# Patient Record
Sex: Male | Born: 1945 | Race: Black or African American | Hispanic: No | Marital: Married | Smoking: Never smoker
Health system: Southern US, Community
[De-identification: ages and names within clinical notes are randomized; demographics above are authoritative.]

## PROBLEM LIST (undated history)

## (undated) DIAGNOSIS — E7800 Pure hypercholesterolemia, unspecified: Secondary | ICD-10-CM

## (undated) DIAGNOSIS — E119 Type 2 diabetes mellitus without complications: Secondary | ICD-10-CM

## (undated) DIAGNOSIS — I10 Essential (primary) hypertension: Secondary | ICD-10-CM

## (undated) DIAGNOSIS — I509 Heart failure, unspecified: Secondary | ICD-10-CM

## (undated) DIAGNOSIS — N289 Disorder of kidney and ureter, unspecified: Secondary | ICD-10-CM

## (undated) DIAGNOSIS — I5189 Other ill-defined heart diseases: Secondary | ICD-10-CM

## (undated) HISTORY — DX: Other ill-defined heart diseases: I51.89

## (undated) HISTORY — PX: NEPHRECTOMY: SHX65

---

## 2013-09-03 ENCOUNTER — Other Ambulatory Visit: Payer: Self-pay | Admitting: Cardiology

## 2013-09-03 DIAGNOSIS — I150 Renovascular hypertension: Secondary | ICD-10-CM

## 2013-09-03 DIAGNOSIS — N189 Chronic kidney disease, unspecified: Secondary | ICD-10-CM

## 2013-09-06 ENCOUNTER — Other Ambulatory Visit: Payer: Self-pay | Admitting: Cardiology

## 2013-09-06 DIAGNOSIS — I872 Venous insufficiency (chronic) (peripheral): Secondary | ICD-10-CM

## 2013-09-13 ENCOUNTER — Ambulatory Visit
Admission: RE | Admit: 2013-09-13 | Discharge: 2013-09-13 | Disposition: A | Discharge status: Home / Self Care | Payer: No Typology Code available for payment source | Source: Ambulatory Visit | Attending: Cardiology | Admitting: Cardiology

## 2013-09-13 DIAGNOSIS — I150 Renovascular hypertension: Secondary | ICD-10-CM

## 2013-09-13 DIAGNOSIS — N189 Chronic kidney disease, unspecified: Secondary | ICD-10-CM

## 2013-09-16 ENCOUNTER — Other Ambulatory Visit: Payer: Self-pay | Admitting: Cardiology

## 2013-09-16 DIAGNOSIS — N2889 Other specified disorders of kidney and ureter: Secondary | ICD-10-CM

## 2013-09-21 ENCOUNTER — Ambulatory Visit
Admission: RE | Admit: 2013-09-21 | Discharge: 2013-09-21 | Disposition: A | Discharge status: Home / Self Care | Payer: No Typology Code available for payment source | Source: Ambulatory Visit | Attending: Cardiology | Admitting: Cardiology

## 2013-09-21 DIAGNOSIS — N2889 Other specified disorders of kidney and ureter: Secondary | ICD-10-CM

## 2013-10-13 ENCOUNTER — Ambulatory Visit: Payer: Self-pay

## 2013-10-28 ENCOUNTER — Ambulatory Visit (HOSPITAL_COMMUNITY)
Admission: RE | Admit: 2013-10-28 | Discharge: 2013-10-28 | Disposition: A | Discharge status: Home / Self Care | Payer: Self-pay | Source: Ambulatory Visit | Attending: Urology | Admitting: Urology

## 2013-10-28 ENCOUNTER — Other Ambulatory Visit (HOSPITAL_COMMUNITY): Payer: Self-pay | Admitting: Urology

## 2013-10-28 DIAGNOSIS — J984 Other disorders of lung: Secondary | ICD-10-CM

## 2013-10-28 DIAGNOSIS — I517 Cardiomegaly: Secondary | ICD-10-CM | POA: Insufficient documentation

## 2013-10-28 DIAGNOSIS — J9819 Other pulmonary collapse: Secondary | ICD-10-CM | POA: Insufficient documentation

## 2013-10-28 DIAGNOSIS — R0602 Shortness of breath: Secondary | ICD-10-CM | POA: Insufficient documentation

## 2013-10-28 DIAGNOSIS — R079 Chest pain, unspecified: Secondary | ICD-10-CM | POA: Insufficient documentation

## 2013-11-06 ENCOUNTER — Encounter (HOSPITAL_COMMUNITY): Payer: Self-pay | Admitting: Emergency Medicine

## 2013-11-06 ENCOUNTER — Emergency Department (HOSPITAL_COMMUNITY): Payer: Self-pay

## 2013-11-06 ENCOUNTER — Inpatient Hospital Stay (HOSPITAL_COMMUNITY)
Admission: EM | Admit: 2013-11-06 | Discharge: 2013-11-11 | DRG: 673 | Disposition: A | Discharge status: Home / Self Care | Payer: No Typology Code available for payment source | Attending: Internal Medicine | Admitting: Internal Medicine

## 2013-11-06 ENCOUNTER — Emergency Department (HOSPITAL_COMMUNITY)
Admission: EM | Admit: 2013-11-06 | Discharge: 2013-11-06 | Disposition: A | Discharge status: Nursing Home (Includes ICF) | Payer: No Typology Code available for payment source | Source: Home / Self Care | Attending: Emergency Medicine | Admitting: Emergency Medicine

## 2013-11-06 DIAGNOSIS — I10 Essential (primary) hypertension: Secondary | ICD-10-CM | POA: Diagnosis present

## 2013-11-06 DIAGNOSIS — Z23 Encounter for immunization: Secondary | ICD-10-CM

## 2013-11-06 DIAGNOSIS — I129 Hypertensive chronic kidney disease with stage 1 through stage 4 chronic kidney disease, or unspecified chronic kidney disease: Principal | ICD-10-CM | POA: Diagnosis present

## 2013-11-06 DIAGNOSIS — I509 Heart failure, unspecified: Secondary | ICD-10-CM | POA: Diagnosis present

## 2013-11-06 DIAGNOSIS — E876 Hypokalemia: Secondary | ICD-10-CM | POA: Diagnosis not present

## 2013-11-06 DIAGNOSIS — Z794 Long term (current) use of insulin: Secondary | ICD-10-CM

## 2013-11-06 DIAGNOSIS — J90 Pleural effusion, not elsewhere classified: Secondary | ICD-10-CM | POA: Diagnosis present

## 2013-11-06 DIAGNOSIS — Z79899 Other long term (current) drug therapy: Secondary | ICD-10-CM

## 2013-11-06 DIAGNOSIS — E109 Type 1 diabetes mellitus without complications: Secondary | ICD-10-CM

## 2013-11-06 DIAGNOSIS — J9600 Acute respiratory failure, unspecified whether with hypoxia or hypercapnia: Secondary | ICD-10-CM | POA: Diagnosis present

## 2013-11-06 DIAGNOSIS — L819 Disorder of pigmentation, unspecified: Secondary | ICD-10-CM | POA: Diagnosis present

## 2013-11-06 DIAGNOSIS — J810 Acute pulmonary edema: Secondary | ICD-10-CM

## 2013-11-06 DIAGNOSIS — N183 Chronic kidney disease, stage 3 unspecified: Secondary | ICD-10-CM | POA: Diagnosis present

## 2013-11-06 DIAGNOSIS — E7800 Pure hypercholesterolemia, unspecified: Secondary | ICD-10-CM | POA: Diagnosis present

## 2013-11-06 DIAGNOSIS — E1129 Type 2 diabetes mellitus with other diabetic kidney complication: Secondary | ICD-10-CM | POA: Diagnosis present

## 2013-11-06 DIAGNOSIS — N289 Disorder of kidney and ureter, unspecified: Secondary | ICD-10-CM | POA: Diagnosis present

## 2013-11-06 DIAGNOSIS — N184 Chronic kidney disease, stage 4 (severe): Secondary | ICD-10-CM

## 2013-11-06 DIAGNOSIS — N189 Chronic kidney disease, unspecified: Secondary | ICD-10-CM

## 2013-11-06 DIAGNOSIS — N179 Acute kidney failure, unspecified: Secondary | ICD-10-CM

## 2013-11-06 DIAGNOSIS — E119 Type 2 diabetes mellitus without complications: Secondary | ICD-10-CM | POA: Diagnosis present

## 2013-11-06 DIAGNOSIS — I160 Hypertensive urgency: Secondary | ICD-10-CM | POA: Diagnosis present

## 2013-11-06 DIAGNOSIS — E785 Hyperlipidemia, unspecified: Secondary | ICD-10-CM | POA: Diagnosis present

## 2013-11-06 DIAGNOSIS — I5033 Acute on chronic diastolic (congestive) heart failure: Secondary | ICD-10-CM | POA: Diagnosis present

## 2013-11-06 DIAGNOSIS — D649 Anemia, unspecified: Secondary | ICD-10-CM | POA: Diagnosis present

## 2013-11-06 DIAGNOSIS — J811 Chronic pulmonary edema: Secondary | ICD-10-CM | POA: Diagnosis present

## 2013-11-06 DIAGNOSIS — N2889 Other specified disorders of kidney and ureter: Secondary | ICD-10-CM

## 2013-11-06 HISTORY — DX: Type 2 diabetes mellitus without complications: E11.9

## 2013-11-06 HISTORY — DX: Pure hypercholesterolemia, unspecified: E78.00

## 2013-11-06 HISTORY — DX: Disorder of kidney and ureter, unspecified: N28.9

## 2013-11-06 HISTORY — DX: Essential (primary) hypertension: I10

## 2013-11-06 LAB — CBC WITH DIFFERENTIAL/PLATELET
Basophils Absolute: 0 10*3/uL (ref 0.0–0.1)
Basophils Relative: 0 % (ref 0–1)
Eosinophils Absolute: 0.1 10*3/uL (ref 0.0–0.7)
Eosinophils Relative: 1 % (ref 0–5)
HCT: 38.7 % — ABNORMAL LOW (ref 39.0–52.0)
HEMOGLOBIN: 12.7 g/dL — AB (ref 13.0–17.0)
LYMPHS PCT: 24 % (ref 12–46)
Lymphs Abs: 1.5 10*3/uL (ref 0.7–4.0)
MCH: 26.8 pg (ref 26.0–34.0)
MCHC: 32.8 g/dL (ref 30.0–36.0)
MCV: 81.6 fL (ref 78.0–100.0)
MONOS PCT: 7 % (ref 3–12)
Monocytes Absolute: 0.4 10*3/uL (ref 0.1–1.0)
NEUTROS ABS: 4.3 10*3/uL (ref 1.7–7.7)
NEUTROS PCT: 68 % (ref 43–77)
Platelets: 204 10*3/uL (ref 150–400)
RBC: 4.74 MIL/uL (ref 4.22–5.81)
RDW: 15.3 % (ref 11.5–15.5)
WBC: 6.3 10*3/uL (ref 4.0–10.5)

## 2013-11-06 LAB — COMPREHENSIVE METABOLIC PANEL
ALK PHOS: 128 U/L — AB (ref 39–117)
ALT: 136 U/L — ABNORMAL HIGH (ref 0–53)
ANION GAP: 18 — AB (ref 5–15)
AST: 59 U/L — AB (ref 0–37)
Albumin: 3.6 g/dL (ref 3.5–5.2)
BILIRUBIN TOTAL: 0.2 mg/dL — AB (ref 0.3–1.2)
BUN: 52 mg/dL — AB (ref 6–23)
CHLORIDE: 106 meq/L (ref 96–112)
CO2: 20 mEq/L (ref 19–32)
Calcium: 8.7 mg/dL (ref 8.4–10.5)
Creatinine, Ser: 3.71 mg/dL — ABNORMAL HIGH (ref 0.50–1.35)
GFR calc Af Amer: 18 mL/min — ABNORMAL LOW (ref >90)
GFR calc non Af Amer: 15 mL/min — ABNORMAL LOW (ref >90)
Glucose, Bld: 83 mg/dL (ref 70–99)
POTASSIUM: 3.7 meq/L (ref 3.7–5.3)
Sodium: 144 mEq/L (ref 137–147)
Total Protein: 7.5 g/dL (ref 6.0–8.3)

## 2013-11-06 LAB — URINALYSIS, ROUTINE W REFLEX MICROSCOPIC
Bilirubin Urine: NEGATIVE
GLUCOSE, UA: NEGATIVE mg/dL
Ketones, ur: NEGATIVE mg/dL
Leukocytes, UA: NEGATIVE
Nitrite: NEGATIVE
Protein, ur: >300 mg/dL — AB
SPECIFIC GRAVITY, URINE: 1.01 (ref 1.005–1.030)
Urobilinogen, UA: 0.2 mg/dL (ref 0.0–1.0)
pH: 6 (ref 5.0–8.0)

## 2013-11-06 LAB — POCT I-STAT, CHEM 8
BUN: 45 mg/dL — ABNORMAL HIGH (ref 6–23)
CHLORIDE: 110 meq/L (ref 96–112)
Calcium, Ion: 1.11 mmol/L — ABNORMAL LOW (ref 1.13–1.30)
Creatinine, Ser: 4.2 mg/dL — ABNORMAL HIGH (ref 0.50–1.35)
GLUCOSE: 82 mg/dL (ref 70–99)
HEMATOCRIT: 41 % (ref 39.0–52.0)
HEMOGLOBIN: 13.9 g/dL (ref 13.0–17.0)
Potassium: 3.5 mEq/L — ABNORMAL LOW (ref 3.7–5.3)
SODIUM: 141 meq/L (ref 137–147)
TCO2: 21 mmol/L (ref 0–100)

## 2013-11-06 LAB — CBG MONITORING, ED
Glucose-Capillary: 127 mg/dL — ABNORMAL HIGH (ref 70–99)
Glucose-Capillary: 72 mg/dL (ref 70–99)

## 2013-11-06 LAB — PRO B NATRIURETIC PEPTIDE: Pro B Natriuretic peptide (BNP): 1815 pg/mL — ABNORMAL HIGH (ref 0–125)

## 2013-11-06 LAB — TROPONIN I: Troponin I: <0.3 ng/mL (ref <0.30)

## 2013-11-06 LAB — URINE MICROSCOPIC-ADD ON

## 2013-11-06 MED ORDER — HEPARIN SODIUM (PORCINE) 5000 UNIT/ML IJ SOLN
5000.0000 [IU] | Freq: Three times a day (TID) | INTRAMUSCULAR | Status: DC
  Administered 2013-11-07 – 2013-11-10 (×11): 5000 [IU] via SUBCUTANEOUS
  Filled 2013-11-06 (×13): qty 1

## 2013-11-06 MED ORDER — DOCUSATE SODIUM 100 MG PO CAPS
100.0000 mg | ORAL_CAPSULE | Freq: Two times a day (BID) | ORAL | Status: DC
  Administered 2013-11-07 – 2013-11-11 (×10): 100 mg via ORAL
  Filled 2013-11-06 (×11): qty 1

## 2013-11-06 MED ORDER — ISOSORB DINITRATE-HYDRALAZINE 20-37.5 MG PO TABS: 1.0000 | ORAL_TABLET | Freq: Two times a day (BID) | ORAL | Status: DC

## 2013-11-06 MED ORDER — CARVEDILOL 6.25 MG PO TABS
6.2500 mg | ORAL_TABLET | Freq: Two times a day (BID) | ORAL | Status: DC
  Administered 2013-11-07 – 2013-11-08 (×4): 6.25 mg via ORAL
  Filled 2013-11-06 (×7): qty 1

## 2013-11-06 MED ORDER — BISACODYL 10 MG RE SUPP: 10.0000 mg | Freq: Every day | RECTAL | Status: DC | PRN

## 2013-11-06 MED ORDER — ONDANSETRON HCL 4 MG/2ML IJ SOLN: 4.0000 mg | Freq: Four times a day (QID) | INTRAMUSCULAR | Status: DC | PRN

## 2013-11-06 MED ORDER — INSULIN ASPART PROT & ASPART (70-30 MIX) 100 UNIT/ML PEN: 15.0000 [IU] | PEN_INJECTOR | Freq: Two times a day (BID) | SUBCUTANEOUS | Status: DC

## 2013-11-06 MED ORDER — CARVEDILOL 6.25 MG PO TABS: 6.2500 mg | ORAL_TABLET | Freq: Two times a day (BID) | ORAL | Status: DC

## 2013-11-06 MED ORDER — SODIUM CHLORIDE 0.9 % IV SOLN: INTRAVENOUS | Status: DC

## 2013-11-06 MED ORDER — SENNA 8.6 MG PO TABS
1.0000 | ORAL_TABLET | Freq: Two times a day (BID) | ORAL | Status: DC
  Administered 2013-11-07 – 2013-11-11 (×10): 8.6 mg via ORAL
  Filled 2013-11-06 (×11): qty 1

## 2013-11-06 MED ORDER — ISOSORB DINITRATE-HYDRALAZINE 20-37.5 MG PO TABS
1.0000 | ORAL_TABLET | Freq: Two times a day (BID) | ORAL | Status: DC
  Administered 2013-11-07 – 2013-11-08 (×4): 1 via ORAL
  Filled 2013-11-06 (×5): qty 1

## 2013-11-06 MED ORDER — INSULIN ASPART 100 UNIT/ML ~~LOC~~ SOLN
0.0000 [IU] | Freq: Three times a day (TID) | SUBCUTANEOUS | Status: DC
Start: 2013-11-07 — End: 2013-11-11
  Administered 2013-11-07: 1 [IU] via SUBCUTANEOUS
  Administered 2013-11-07 (×2): 2 [IU] via SUBCUTANEOUS
  Administered 2013-11-08 – 2013-11-10 (×4): 3 [IU] via SUBCUTANEOUS
  Administered 2013-11-11: 5 [IU] via SUBCUTANEOUS
  Administered 2013-11-11: 2 [IU] via SUBCUTANEOUS

## 2013-11-06 MED ORDER — AMLODIPINE-OLMESARTAN 10-20 MG PO TABS: 1.0000 | ORAL_TABLET | Freq: Every day | ORAL | Status: DC

## 2013-11-06 MED ORDER — GLUCOSE BLOOD VI STRP: ORAL_STRIP | Status: DC

## 2013-11-06 MED ORDER — AMLODIPINE BESYLATE 10 MG PO TABS
10.0000 mg | ORAL_TABLET | Freq: Every day | ORAL | Status: DC
Start: 2013-11-07 — End: 2013-11-11
  Administered 2013-11-07 – 2013-11-11 (×5): 10 mg via ORAL
  Filled 2013-11-06 (×5): qty 1

## 2013-11-06 MED ORDER — IRBESARTAN 150 MG PO TABS
150.0000 mg | ORAL_TABLET | Freq: Every day | ORAL | Status: DC
  Filled 2013-11-06: qty 1

## 2013-11-06 MED ORDER — HYDRALAZINE HCL 20 MG/ML IJ SOLN
10.0000 mg | Freq: Four times a day (QID) | INTRAMUSCULAR | Status: DC | PRN
  Administered 2013-11-07 – 2013-11-11 (×6): 10 mg via INTRAVENOUS
  Filled 2013-11-06 (×7): qty 1

## 2013-11-06 MED ORDER — LABETALOL HCL 5 MG/ML IV SOLN
20.0000 mg | Freq: Once | INTRAVENOUS | Status: AC
  Administered 2013-11-06: 20 mg via INTRAVENOUS
  Filled 2013-11-06: qty 4

## 2013-11-06 MED ORDER — FUROSEMIDE 10 MG/ML IJ SOLN
40.0000 mg | Freq: Two times a day (BID) | INTRAMUSCULAR | Status: DC
  Administered 2013-11-07: 40 mg via INTRAVENOUS
  Filled 2013-11-06 (×3): qty 4

## 2013-11-06 MED ORDER — ONDANSETRON HCL 4 MG PO TABS: 4.0000 mg | ORAL_TABLET | Freq: Four times a day (QID) | ORAL | Status: DC | PRN

## 2013-11-06 MED ORDER — SODIUM CHLORIDE 0.9 % IJ SOLN
3.0000 mL | Freq: Two times a day (BID) | INTRAMUSCULAR | Status: DC
  Administered 2013-11-07 – 2013-11-11 (×10): 3 mL via INTRAVENOUS

## 2013-11-06 MED ORDER — INSULIN ASPART PROT & ASPART (70-30 MIX) 100 UNIT/ML ~~LOC~~ SUSP
15.0000 [IU] | Freq: Two times a day (BID) | SUBCUTANEOUS | Status: DC
  Filled 2013-11-06: qty 10

## 2013-11-06 NOTE — ED Notes (Signed)
[facility]. [facility] this evening for further evaluation , reports exertional dyspnea , hypertension  , EKG T wave inversion  and elevated Creat =4.2 .

## 2013-11-06 NOTE — ED Notes (Signed)
MD at bedside. 

## 2013-11-06 NOTE — ED Notes (Signed)
Dr. Lorenz CoasterKeller talked w/ pt & spouse; agreed to send pt by shuttle per their request.  Report called to Donnamae JudeKeshia, ED First Nurse - was told pt would need to be taken to room upon arrival.

## 2013-11-06 NOTE — ED Notes (Signed)
Per daughter, pt has been out of meds x few days.

## 2013-11-06 NOTE — ED Notes (Signed)
Pt requesting refills of his Bidil, Azor, Norvasc, and Novolog.  Does not know dosages of all the meds, as his PCP dispenses the meds to him (does not get filled at a pharmacy); PCP on vacation.  Has also been out of all glucometer supplies.  Has a f/u appt in 2 days (? With nephrologist) for abnormal kidney function.

## 2013-11-06 NOTE — ED Notes (Signed)
Patient bld sugar 72 doctor gave orders may eat [place] sandwhich and drink

## 2013-11-06 NOTE — ED Notes (Signed)
Discussed transfer to ED and need for IV with pt & spouse. (Daughter stepped out).  They have concern about transferring to ED via ambulance.  Dr. Lorenz CoasterKeller notified; will discuss with family.

## 2013-11-06 NOTE — ED Provider Notes (Signed)
CSN: [id number]     Arrival date & time 11/06/13  1903 History   First MD Initiated Contact with Patient 11/06/13 1920     Chief Complaint  Patient presents with  . Hypertension     (Consider location/radiation/quality/duration/timing/severity/associated sxs/prior Treatment) Patient is a 68 y.o. male presenting with hypertension and shortness of breath. The history is provided by the patient.  Hypertension This is a chronic problem. Associated symptoms include shortness of breath. Pertinent negatives include no chest pain, no abdominal pain and no headaches.  Shortness of Breath Severity:  Mild Onset quality:  Gradual Duration:  2 weeks Timing:  Constant Progression:  Unchanged Chronicity:  New Context: activity   Relieved by:  Rest Worsened by:  Activity Ineffective treatments:  None tried Associated symptoms: no abdominal pain, no chest pain, no cough, no fever, no headaches, no neck pain and no vomiting     Past Medical History  Diagnosis Date  . Hypertension   . Diabetes mellitus without complication   . Hypercholesteremia   . Renal disorder     in process of being diagnosed   History reviewed. No pertinent past surgical history. No family history on file. History  Substance Use Topics  . Smoking status: Never Smoker   . Smokeless tobacco: Not on file  . Alcohol Use: No    Review of Systems  Constitutional: Negative for fever.  HENT: Negative for drooling and rhinorrhea.   Eyes: Negative for pain.  Respiratory: Positive for shortness of breath. Negative for cough.   Cardiovascular: Positive for leg swelling. Negative for chest pain.  Gastrointestinal: Negative for nausea, vomiting, abdominal pain and diarrhea.  Genitourinary: Negative for dysuria and hematuria.  Musculoskeletal: Negative for gait problem and neck pain.  Skin: Negative for color change.  Neurological: Negative for numbness and headaches.  Hematological: Negative for adenopathy.   Psychiatric/Behavioral: Negative for behavioral problems.  All other systems reviewed and are negative.     Allergies  Review of patient's allergies indicates no known allergies.  Home Medications   Prior to Admission medications   Medication Sig Start Date End Date Taking? Authorizing Provider  amLODipine (NORVASC) 10 MG tablet Take 10 mg by mouth daily.   Yes Historical Provider, MD  amlodipine-olmesartan (AZOR) 10-20 MG per tablet Take 1 tablet by mouth daily. 11/06/13  Yes Reuben Likes, MD  Insulin Aspart Prot & Aspart (NOVOLOG MIX 70/30 FLEXPEN) (70-30) 100 UNIT/ML Pen Inject 15 Units into the skin 2 (two) times daily. 11/06/13  Yes Reuben Likes, MD  carvedilol (COREG) 6.25 MG tablet Take 1 tablet (6.25 mg total) by mouth 2 (two) times daily with a meal. 11/06/13   Reuben Likes, MD  glucose blood (BAYER CONTOUR NEXT TEST) test strip 1 each by Other route as needed for other. Use as instructed    Historical Provider, MD  glucose blood (BAYER CONTOUR NEXT TEST) test strip Use as instructed 11/06/13   Reuben Likes, MD  isosorbide-hydrALAZINE (BIDIL) 20-37.5 MG per tablet Take 1 tablet by mouth 2 (two) times daily. 11/06/13   Reuben Likes, MD  Lancet Device MISC by Does not apply route.    Historical Provider, MD  Rosuvastatin Calcium (CRESTOR PO) Take by mouth daily.    Historical Provider, MD   BP 203/91  Pulse 94  Temp(Src) 98.8 F (37.1 C) (Oral)  Resp 14  Ht 5\' 10"  (1.778 m)  Wt 191 lb (86.637 kg)  BMI 27.41 kg/m2  SpO2 94% Physical Exam  Nursing note and vitals reviewed. Constitutional: He is oriented to person, place, and time. He appears well-developed and well-nourished.  HENT:  Head: Normocephalic and atraumatic.  Right Ear: External ear normal.  Left Ear: External ear normal.  Nose: Nose normal.  Mouth/Throat: Oropharynx is clear and moist. No oropharyngeal exudate.  Eyes: Conjunctivae and EOM are normal. Pupils are equal, round, and reactive to light.  Neck:  Normal range of motion. Neck supple.  Cardiovascular: Normal rate, regular rhythm, normal heart sounds and intact distal pulses.  Exam reveals no gallop and no friction rub.   No murmur heard. Pulmonary/Chest: Effort normal and breath sounds normal. No respiratory distress. He has no wheezes.  Abdominal: Soft. Bowel sounds are normal. He exhibits no distension. There is no tenderness. There is no rebound and no guarding.  Musculoskeletal: Normal range of motion. He exhibits edema (mild to moderate pitting edema in the bilateral lower extremities.). He exhibits no tenderness.  Neurological: He is alert and oriented to person, place, and time.  Skin: Skin is warm and dry.  Psychiatric: He has a normal mood and affect. His behavior is normal.    ED Course  Procedures (including critical care time) Labs Review Labs Reviewed  CBC WITH DIFFERENTIAL - Abnormal; Notable for the following:    Hemoglobin 12.7 (*)    HCT 38.7 (*)    All other components within normal limits  COMPREHENSIVE METABOLIC PANEL - Abnormal; Notable for the following:    BUN 52 (*)    Creatinine, Ser 3.71 (*)    AST 59 (*)    ALT 136 (*)    Alkaline Phosphatase 128 (*)    Total Bilirubin 0.2 (*)    GFR calc non Af Amer 15 (*)    GFR calc Af Amer 18 (*)    Anion gap 18 (*)    All other components within normal limits  PRO B NATRIURETIC PEPTIDE - Abnormal; Notable for the following:    Pro B Natriuretic peptide (BNP) 1815.0 (*)    All other components within normal limits  URINALYSIS, ROUTINE W REFLEX MICROSCOPIC - Abnormal; Notable for the following:    APPearance CLOUDY (*)    Hgb urine dipstick SMALL (*)    Protein, ur >300 (*)    All other components within normal limits  URINE MICROSCOPIC-ADD ON - Abnormal; Notable for the following:    Squamous Epithelial / LPF FEW (*)    Bacteria, UA FEW (*)    All other components within normal limits  BASIC METABOLIC PANEL - Abnormal; Notable for the following:     Potassium 3.4 (*)    Glucose, Bld 168 (*)    BUN 49 (*)    Creatinine, Ser 3.64 (*)    GFR calc non Af Amer 16 (*)    GFR calc Af Amer 18 (*)    Anion gap 16 (*)    All other components within normal limits  CBC - Abnormal; Notable for the following:    Hemoglobin 11.4 (*)    HCT 34.6 (*)    All other components within normal limits  GLUCOSE, CAPILLARY - Abnormal; Notable for the following:    Glucose-Capillary 175 (*)    All other components within normal limits  GLUCOSE, CAPILLARY - Abnormal; Notable for the following:    Glucose-Capillary 140 (*)    All other components within normal limits  GLUCOSE, CAPILLARY - Abnormal; Notable for the following:    Glucose-Capillary 169 (*)    All other  components within normal limits  CBG MONITORING, ED - Abnormal; Notable for the following:    Glucose-Capillary 127 (*)    All other components within normal limits  TROPONIN I  PROTIME-INR  CBG MONITORING, ED    Imaging Review Dg Chest 2 View  11/06/2013   CLINICAL DATA:  Shortness of breath.  Renal disorder.  EXAM: CHEST  2 VIEW  COMPARISON:  None.  FINDINGS: Enlarged cardiomediastinal silhouette. BILATERAL airspace opacity with BILATERAL pleural effusions suggesting pulmonary edema. No pneumothorax. No osseous lesions. Degenerative change thoracic spine. Trachea midline.  IMPRESSION: Cardiomegaly with suspected pulmonary edema. BILATERAL pneumonia could have this appearance, but is less favored.   Electronically Signed   By: Davonna BellingJohn  Curnes M.D.   On: 11/06/2013 21:18     EKG Interpretation   Date/Time:  Saturday November 06 2013 19:56:35 EDT Ventricular Rate:  83 PR Interval:  176 QRS Duration: 99 QT Interval:  415 QTC Calculation: 488 R Axis:   -112 Text Interpretation:  Sinus rhythm Multiple premature complexes, vent   Probable left atrial enlargement Left anterior fascicular block Abnormal  lateral Q waves Anterior infarct, old t wave abn's in lateral leads no  longer appreciated  on repeat ecg in comparison to prior earlier today  Confirmed by Oriana Horiuchi  MD, Demarkus Remmel ([id number]) on 11/06/2013 8:07:59 PM      MDM   Final diagnoses:  Hypertensive urgency    7:38 PM 68 y.o. male w hx of HTN, DM, renal disorder who presents with gradual onset shortness of breath for the last 2 weeks and worsening edema in his lower extremities over the last month. He was referred to the ER by an urgent care today. He initially presented to the urgent care for a medication refill. He had an i-STAT chem 8 performed which showed a creatinine of 4.2 and a potassium of 3.5. He also had some T wave abnormalities on his EKG. He denies any chest pain, fever, vomiting, abdominal pain, or diarrhea. He is afebrile and hypertensive here. Will get screening labs and imaging. He states that he did take his blood pressure medication today.  The pt states he has been referred to and has seen a nephrologist, (Dr. Hyman HopesWebb).   I do not have previous labs to compare to. Not sure what his Cr is related to baseline. Given elev BP's here despite taking his bp meds today I think admission for hypertensive urgency is reasonable.   Purvis SheffieldForrest Kalandra Masters, MD 11/07/13 1149

## 2013-11-06 NOTE — ED Notes (Signed)
Pt placed on heart monitor.

## 2013-11-06 NOTE — Discharge Instructions (Signed)
We have determined that your problem requires further evaluation in the emergency department.  We will take care of your transport there.  Once at the emergency department, you will be evaluated by a provider and they will order whatever treatment or tests they deem necessary.  We cannot guarantee that they will do any specific test or do any specific treatment.  ° °

## 2013-11-06 NOTE — H&P (Signed)
Michael Dorsey is an 68 y.o. male.   Chief Complaint: Accelerated blood pressure, shortness of breath, and edema. HPI: Pt is a chronically ill 68 yr old male who presented to the urgent care today seeking refills on his medications. While there he revealed that he had had increasing shortness of breath over the course of the past week. He has also had a severe cough that has not produced sputum. He has also had increased edema of his lower extremities bilaterally. His EKG at that facility reportedly demonstrated lateral ST segment depression, but that has not been replicated here. His blood pressure has been as high as 212/101. His systolic pressure is not in the 180's. His blood sugar is 72. Nursing is getting him some orange juice and a [place] sandwich.  Past Medical History  Diagnosis Date  . Hypertension   . Diabetes mellitus without complication   . Hypercholesteremia   . Renal disorder     in process of being diagnosed    History reviewed. No pertinent past surgical history.  No family history on file. Social History:  reports that he has never smoked. He does not have any smokeless tobacco history on file. He reports that he does not drink alcohol or use illicit drugs.  Allergies: No Known Allergies   (Not in a hospital admission)  Results for orders placed during the hospital encounter of 11/06/13 (from the past 48 hour(s))  TROPONIN I     Status: None   Collection Time    11/06/13  7:38 PM      Result Value Ref Range   Troponin I <0.30  <0.30 ng/mL   Comment:            Due to the release kinetics of cTnI,     a negative result within the first hours     of the onset of symptoms does not rule out     myocardial infarction with certainty.     If myocardial infarction is still suspected,     repeat the test at appropriate intervals.  PRO B NATRIURETIC PEPTIDE     Status: Abnormal   Collection Time    11/06/13  7:38 PM      Result Value Ref Range   Pro B Natriuretic  peptide (BNP) 1815.0 (*) 0 - 125 pg/mL  URINALYSIS, ROUTINE W REFLEX MICROSCOPIC     Status: Abnormal   Collection Time    11/06/13  7:53 PM      Result Value Ref Range   Color, Urine YELLOW  YELLOW   APPearance CLOUDY (*) CLEAR   Specific Gravity, Urine 1.010  1.005 - 1.030   pH 6.0  5.0 - 8.0   Glucose, UA NEGATIVE  NEGATIVE mg/dL   Hgb urine dipstick SMALL (*) NEGATIVE   Bilirubin Urine NEGATIVE  NEGATIVE   Ketones, ur NEGATIVE  NEGATIVE mg/dL   Protein, ur >300 (*) NEGATIVE mg/dL   Urobilinogen, UA 0.2  0.0 - 1.0 mg/dL   Nitrite NEGATIVE  NEGATIVE   Leukocytes, UA NEGATIVE  NEGATIVE  URINE MICROSCOPIC-ADD ON     Status: Abnormal   Collection Time    11/06/13  7:53 PM      Result Value Ref Range   Squamous Epithelial / LPF FEW (*) RARE   WBC, UA 0-2  <3 WBC/hpf   RBC / HPF 3-6  <3 RBC/hpf   Bacteria, UA FEW (*) RARE  CBC WITH DIFFERENTIAL     Status: Abnormal   Collection  Time    11/06/13  8:00 PM      Result Value Ref Range   WBC 6.3  4.0 - 10.5 K/uL   RBC 4.74  4.22 - 5.81 MIL/uL   Hemoglobin 12.7 (*) 13.0 - 17.0 g/dL   HCT 38.7 (*) 39.0 - 52.0 %   MCV 81.6  78.0 - 100.0 fL   MCH 26.8  26.0 - 34.0 pg   MCHC 32.8  30.0 - 36.0 g/dL   RDW 15.3  11.5 - 15.5 %   Platelets 204  150 - 400 K/uL   Neutrophils Relative % 68  43 - 77 %   Neutro Abs 4.3  1.7 - 7.7 K/uL   Lymphocytes Relative 24  12 - 46 %   Lymphs Abs 1.5  0.7 - 4.0 K/uL   Monocytes Relative 7  3 - 12 %   Monocytes Absolute 0.4  0.1 - 1.0 K/uL   Eosinophils Relative 1  0 - 5 %   Eosinophils Absolute 0.1  0.0 - 0.7 K/uL   Basophils Relative 0  0 - 1 %   Basophils Absolute 0.0  0.0 - 0.1 K/uL  COMPREHENSIVE METABOLIC PANEL     Status: Abnormal   Collection Time    11/06/13  8:00 PM      Result Value Ref Range   Sodium 144  137 - 147 mEq/L   Potassium 3.7  3.7 - 5.3 mEq/L   Chloride 106  96 - 112 mEq/L   CO2 20  19 - 32 mEq/L   Glucose, Bld 83  70 - 99 mg/dL   BUN 52 (*) 6 - 23 mg/dL   Creatinine, Ser  3.71 (*) 0.50 - 1.35 mg/dL   Calcium 8.7  8.4 - 10.5 mg/dL   Total Protein 7.5  6.0 - 8.3 g/dL   Albumin 3.6  3.5 - 5.2 g/dL   AST 59 (*) 0 - 37 U/L   ALT 136 (*) 0 - 53 U/L   Alkaline Phosphatase 128 (*) 39 - 117 U/L   Total Bilirubin 0.2 (*) 0.3 - 1.2 mg/dL   GFR calc non Af Amer 15 (*) >90 mL/min   GFR calc Af Amer 18 (*) >90 mL/min   Comment: (NOTE)     The eGFR has been calculated using the CKD EPI equation.     This calculation has not been validated in all clinical situations.     eGFR's persistently <90 mL/min signify possible Chronic Kidney     Disease.   Anion gap 18 (*) 5 - 15  CBG MONITORING, ED     Status: None   Collection Time    11/06/13 10:34 PM      Result Value Ref Range   Glucose-Capillary 72  70 - 99 mg/dL   Dg Chest 2 View  11/06/2013   CLINICAL DATA:  Shortness of breath.  Renal disorder.  EXAM: CHEST  2 VIEW  COMPARISON:  None.  FINDINGS: Enlarged cardiomediastinal silhouette. BILATERAL airspace opacity with BILATERAL pleural effusions suggesting pulmonary edema. No pneumothorax. No osseous lesions. Degenerative change thoracic spine. Trachea midline.  IMPRESSION: Cardiomegaly with suspected pulmonary edema. BILATERAL pneumonia could have this appearance, but is less favored.   Electronically Signed   By: Rolla Flatten M.D.   On: 11/06/2013 21:18    Review of Systems  Constitutional: Positive for malaise/fatigue. Negative for fever, chills, weight loss and diaphoresis.  HENT: Negative for congestion and sore throat.   Eyes: Negative for blurred vision,  double vision and redness.  Respiratory: Positive for cough and shortness of breath. Negative for sputum production and wheezing.        Increasing shortness of breath for the last week as well as increased swelling of his lower extremities bilaterally. He said that he has not had chest pain, but that he has been coughing severely, but the cough has been dry.  Cardiovascular: Positive for orthopnea and leg  swelling. Negative for chest pain, palpitations and claudication.  Gastrointestinal: Positive for constipation. Negative for nausea, vomiting, abdominal pain and diarrhea.  Genitourinary: Negative for dysuria, frequency and flank pain.  Musculoskeletal: Negative for joint pain, myalgias and neck pain.  Skin: Negative for itching and rash.  Neurological: Positive for weakness. Negative for dizziness, tremors, focal weakness, seizures and headaches.  Endo/Heme/Allergies: Negative for environmental allergies and polydipsia. Does not bruise/bleed easily.  Psychiatric/Behavioral: Negative for depression and hallucinations. The patient is not nervous/anxious.     Blood pressure 182/97, pulse 79, temperature 98 F (36.7 C), temperature source Oral, resp. rate 17, height 5' 10"  (1.778 m), weight 86.637 kg (191 lb), SpO2 98.00%. Physical Exam  Constitutional: He is oriented to person, place, and time. He appears well-developed and well-nourished. No distress.  HENT:  Head: Normocephalic and atraumatic.  Eyes: EOM are normal. Pupils are equal, round, and reactive to light. No scleral icterus.  Neck: JVD present. No tracheal deviation present. No thyromegaly present.  Cardiovascular: Normal rate, regular rhythm and normal heart sounds.  Exam reveals no gallop and no friction rub.   No murmur heard. Respiratory: No stridor. He is in respiratory distress. He has no wheezes. He has rales. He exhibits no tenderness.  Increased work of breathing. No tactile fremitus. Positive for bilateral rales. No wheezes or rhonchi.   GI: He exhibits no distension and no mass. There is no tenderness. There is no rebound.  Musculoskeletal: He exhibits edema. He exhibits no tenderness.  3+ pitting edema bilaterally.  Neurological: He is alert and oriented to person, place, and time. He has normal reflexes. No cranial nerve deficit. Coordination normal.  Skin: Skin is warm and dry. No rash noted. He is not diaphoretic. No  erythema. No pallor.  Psychiatric: He has a normal mood and affect. His behavior is normal. Judgment and thought content normal.     Assessment/Plan 1. Accelerated hypertension. Pt will be admitted and his blood pressure monitored. He will be continued on his home medications. He will also have orders for prn hydralazine. 2. Respiratory distress. Supplementary O2., diuresis, control of blood pressure. 3. Pulmonary edema - Diuresis, control blood pressure. 4. Pleural Effusions - Pt may require thoracentesis.  Diuresis for now. 5. Acute on chronic renal failure. - Nephrology consult 6. CHF - check echocardiogram., Will not use ACE I due to patient's elevated creatinine.  Paquita Printy 11/06/2013, 10:47 PM

## 2013-11-06 NOTE — ED Provider Notes (Signed)
Chief Complaint   Chief Complaint  Patient presents with  . Medication Refill    History of Present Illness   Michael BookmanJohn Dorsey is a 68 year old male who presents tonight for refill on medicines for diabetes and high blood pressure. He needs refills on his Azore, carvedilol, Bayer Contour test strips, NovoLog 70/30 mix, and BiDil. The patient is seeing a private physician, Dr. Aleene DavidsonGeorge Dorsey and has been referred to Dr. Nadara Dorsey and a nephrologist. [facility]. He's been on all his medicines for a couple of days. He notes that over the past 2 weeks he's had dyspnea with exertion and swelling of his legs. He denies any chest pain, tightness, or pressure. No PND or orthopnea. He denies any fever, chills, headache, stiff neck, abdominal pain, nausea, or vomiting. We do not have any previous EKG tracings or baseline labs to compare blood work for today.  Review of Systems   Other than as noted above, the patient denies any of the following symptoms. Systemic:  No fever or chills. ENT:  No nasal congestion, rhinorrhea, or sore throat. Pulmonary:  No cough, wheezing, sputum production, hemoptysis. Cardiac:  No chest pain, palpitations, rapid heartbeat, dizziness, presyncope or syncope. Skin:  No rash or itching. Ext:  No leg pain or swelling. Psych:  No anxiety or depression.  PMFSH   Past medical history, family history, social history, meds, and allergies were reviewed.   Physical Examination    Vital signs:  BP 196/66  Pulse 75  Temp(Src) 98.4 F (36.9 C) (Oral)  SpO2 96% Gen:  Alert, oriented, in no distress, skin warm and dry. Eye:  PERRL, lids and conjunctivas normal.  Sclera non-icteric. ENT:  Mucous membranes moist, pharynx clear. Neck:  Supple, no adenopathy or tenderness.  No JVD. Lungs:  Clear to auscultation and percussion, no wheezes, rales or rhonchi.  No respiratory distress. Heart:  Regular rhythm.  No gallops,  murmers, clicks or rubs. Abdomen:  Soft, nontender, no organomegaly or mass.  Bowel sounds normal.  No pulsatile abdominal mass or bruit. Ext:  He has bilateral pitting ankle edema extending up to the mid lower leg.  No calf tenderness and Homann's sign negative.  Pulses full and equal. Skin:  Warm and dry.  No rash.  Labs   Results for orders placed during the hospital encounter of 11/06/13  POCT I-STAT, CHEM 8      Result Value Ref Range   Sodium 141  137 - 147 mEq/L   Potassium 3.5 (*) 3.7 - 5.3 mEq/L   Chloride 110  96 - 112 mEq/L   BUN 45 (*) 6 - 23 mg/dL   Creatinine, Ser 1.614.20 (*) 0.50 - 1.35 mg/dL   Glucose, Bld 82  70 - 99 mg/dL   Calcium, Ion 0.961.11 (*) 1.13 - 1.30 mmol/L   TCO2 21  0 - 100 mmol/L   Hemoglobin 13.9  13.0 - 17.0 g/dL   HCT 04.541.0  40.939.0 - 81.152.0 %     EKG Results:  Date: 11/06/2013  Rate: 79  Rhythm: normal sinus rhythm  QRS Axis: left--  -76  Intervals: normal  ST/T Wave abnormalities: nonspecific T wave changes  Conduction Disutrbances:none  Narrative Interpretation: Normal sinus rhythm, possible left atrial enlargement, left axis deviation, septal infarct, age undetermined, T-wave abnormality consistent with lateral ischemia.  Old EKG Reviewed: none available   Course in Urgent Care Center   We placed him on a monitor and room  to start an IV, however he declines transport via [facility] or EMS and requested instead transported by shuttle. We'll go ahead and do this, with explicit instructions to the charge nurse at the hospital that he be taken right back to the treatment area and monitored.  Assessment   The primary encounter diagnosis was Essential hypertension. Diagnoses of Type 1 diabetes mellitus without complication, Chronic congestive heart failure, unspecified congestive heart failure type, and Chronic kidney disease, stage 4 (severe) were also pertinent to this visit.  Plan    1.  Meds:  The following meds were prescribed:   New Prescriptions    AMLODIPINE-OLMESARTAN (AZOR) 10-20 MG PER TABLET    Take 1 tablet by mouth daily.   CARVEDILOL (COREG) 6.25 MG TABLET    Take 1 tablet (6.25 mg total) by mouth 2 (two) times daily with a meal.   GLUCOSE BLOOD (BAYER CONTOUR NEXT TEST) TEST STRIP    Use as instructed   INSULIN ASPART PROT & ASPART (NOVOLOG MIX 70/30 FLEXPEN) (70-30) 100 UNIT/ML PEN    Inject 15 Units into the skin 2 (two) times daily.   ISOSORBIDE-HYDRALAZINE (BIDIL) 20-37.5 MG PER TABLET    Take 1 tablet by mouth 2 (two) times daily.    The patient was transferred to the ED via [facility] or shuttle in stable condition.  Medical Decision Making:  68 year old male presents today for refill on BP and diabetic medications.  He relates a history of shortness of breath with exertion and ankle edema for 2 weeks.  No chest pain or discomfort.  His EKG reveals a previous anterior MI and T wave inversions in lateral precordial leads consistent with ischemia.  We have no old EKG for comparison.  Also his creatinine on iStat 8 was 4.2.  We do no have any prior labs for comparison.  I am concerned about anginal equivalent, CHF, and acute vs. Chronic renal failure.  We will transport via shuttle since he declines [facility] or EMS transport.         Michael Likes, MD 11/06/13 [phone]

## 2013-11-07 ENCOUNTER — Encounter (HOSPITAL_COMMUNITY): Payer: Self-pay | Admitting: General Practice

## 2013-11-07 DIAGNOSIS — E785 Hyperlipidemia, unspecified: Secondary | ICD-10-CM

## 2013-11-07 DIAGNOSIS — N183 Chronic kidney disease, stage 3 unspecified: Secondary | ICD-10-CM

## 2013-11-07 DIAGNOSIS — J810 Acute pulmonary edema: Secondary | ICD-10-CM

## 2013-11-07 DIAGNOSIS — E119 Type 2 diabetes mellitus without complications: Secondary | ICD-10-CM

## 2013-11-07 DIAGNOSIS — N179 Acute kidney failure, unspecified: Secondary | ICD-10-CM

## 2013-11-07 LAB — BASIC METABOLIC PANEL
ANION GAP: 16 — AB (ref 5–15)
BUN: 49 mg/dL — ABNORMAL HIGH (ref 6–23)
CALCIUM: 8.5 mg/dL (ref 8.4–10.5)
CO2: 22 meq/L (ref 19–32)
Chloride: 106 mEq/L (ref 96–112)
Creatinine, Ser: 3.64 mg/dL — ABNORMAL HIGH (ref 0.50–1.35)
GFR calc Af Amer: 18 mL/min — ABNORMAL LOW (ref >90)
GFR calc non Af Amer: 16 mL/min — ABNORMAL LOW (ref >90)
Glucose, Bld: 168 mg/dL — ABNORMAL HIGH (ref 70–99)
Potassium: 3.4 mEq/L — ABNORMAL LOW (ref 3.7–5.3)
Sodium: 144 mEq/L (ref 137–147)

## 2013-11-07 LAB — PHOSPHORUS: Phosphorus: 4.1 mg/dL (ref 2.3–4.6)

## 2013-11-07 LAB — CBC
HEMATOCRIT: 34.6 % — AB (ref 39.0–52.0)
Hemoglobin: 11.4 g/dL — ABNORMAL LOW (ref 13.0–17.0)
MCH: 26.8 pg (ref 26.0–34.0)
MCHC: 32.9 g/dL (ref 30.0–36.0)
MCV: 81.4 fL (ref 78.0–100.0)
PLATELETS: 187 10*3/uL (ref 150–400)
RBC: 4.25 MIL/uL (ref 4.22–5.81)
RDW: 15.4 % (ref 11.5–15.5)
WBC: 4.7 10*3/uL (ref 4.0–10.5)

## 2013-11-07 LAB — PROTIME-INR
INR: 1.07 (ref 0.00–1.49)
Prothrombin Time: 13.9 seconds (ref 11.6–15.2)

## 2013-11-07 LAB — GLUCOSE, CAPILLARY
GLUCOSE-CAPILLARY: 140 mg/dL — AB (ref 70–99)
GLUCOSE-CAPILLARY: 195 mg/dL — AB (ref 70–99)
Glucose-Capillary: 141 mg/dL — ABNORMAL HIGH (ref 70–99)
Glucose-Capillary: 169 mg/dL — ABNORMAL HIGH (ref 70–99)
Glucose-Capillary: 175 mg/dL — ABNORMAL HIGH (ref 70–99)

## 2013-11-07 MED ORDER — FUROSEMIDE 10 MG/ML IJ SOLN
80.0000 mg | Freq: Two times a day (BID) | INTRAMUSCULAR | Status: DC
  Administered 2013-11-07: 80 mg via INTRAVENOUS
  Filled 2013-11-07 (×2): qty 8

## 2013-11-07 MED ORDER — HYDRALAZINE HCL 25 MG PO TABS
25.0000 mg | ORAL_TABLET | Freq: Three times a day (TID) | ORAL | Status: DC
  Administered 2013-11-07: 25 mg via ORAL
  Filled 2013-11-07 (×3): qty 1

## 2013-11-07 MED ORDER — PNEUMOCOCCAL VAC POLYVALENT 25 MCG/0.5ML IJ INJ
0.5000 mL | INJECTION | INTRAMUSCULAR | Status: AC
  Administered 2013-11-07: 0.5 mL via INTRAMUSCULAR
  Filled 2013-11-07: qty 0.5

## 2013-11-07 MED ORDER — INSULIN GLARGINE 100 UNIT/ML ~~LOC~~ SOLN
10.0000 [IU] | Freq: Every day | SUBCUTANEOUS | Status: DC
  Administered 2013-11-07: 10 [IU] via SUBCUTANEOUS
  Filled 2013-11-07: qty 0.1

## 2013-11-07 MED ORDER — POTASSIUM CHLORIDE CRYS ER 20 MEQ PO TBCR
20.0000 meq | EXTENDED_RELEASE_TABLET | Freq: Two times a day (BID) | ORAL | Status: DC
  Administered 2013-11-07 (×2): 20 meq via ORAL
  Filled 2013-11-07 (×4): qty 1

## 2013-11-07 MED ORDER — POLYETHYLENE GLYCOL 3350 17 G PO PACK
17.0000 g | PACK | Freq: Two times a day (BID) | ORAL | Status: DC
  Administered 2013-11-07 – 2013-11-11 (×4): 17 g via ORAL
  Filled 2013-11-07 (×10): qty 1

## 2013-11-07 NOTE — Progress Notes (Signed)
UR Completed.  Michael Dorsey Jane [phone] 11/07/2013  

## 2013-11-07 NOTE — Progress Notes (Signed)
[facility] PROGRESS NOTE  Michael Dorsey ZOX:[id number] DOB: [birth year] DOA: 11/06/2013 PCP: Jackie Plum, MD  Assessment/Plan: 1-Acute respiratory Failure; related to pulmonary edema, related to renal failure. Also component of Heart failure.  Continue with IV lasix, Check ECHO.  Will need to repeat Chest x ray to follow up pleural effusion.   2-CKD, stage IV: Cr at 3.7, unclear baseline. Continue with IV lasix. Nephrologist consulted.  -Hold ACE.   3-? Renal Mass; needs to follow up with urology.   4-Diabetes; hold 70/30. Continue with SSI. Start Lantus.   5-Hypertension; continue with Norvasc, Bidil, Coreg. Started on IV lasix.   6-Bilateral LE edema,worse on the right: Check doppler.   Code Status: Full Code.  Family Communication: Care discussed with patient.  Disposition Plan: Home when stable.    Consultants:  Nephrologist.   Procedures:  Doppler LE:   Antibiotics:  none  HPI/Subjective: He is feeling better.  He presents with worsening SOB, worse over last 2 weeks.  He feels he is breathing better, dry cough has improved.   Objective: Filed Vitals:   11/07/13 0804  BP: 186/60  Pulse: 91  Temp:   Resp:     Intake/Output Summary (Last 24 hours) at 11/07/13 1017 Last data filed at 11/07/13 0800  Gross per 24 hour  Intake    120 ml  Output   1925 ml  Net  -1805 ml   Filed Weights   11/06/13 1920 11/06/13 2343 11/07/13 0507  Weight: 86.637 kg (191 lb) 86.728 kg (191 lb 3.2 oz) 86.7 kg (191 lb 2.2 oz)    Exam:   General:  No distress.   Cardiovascular: S 1, S 2 RRR  Respiratory: Crackles bases.   Abdomen: BS present, soft, NT  Musculoskeletal:Bilateral LE edema, bilateral hyperpigmentation.   Data Reviewed: Basic Metabolic Panel:  Recent Labs Lab 11/06/13 1748 11/06/13 2000 11/07/13 0430  NA 141 144 144  K 3.5* 3.7 3.4*  CL 110 106 106  CO2  --  20 22  GLUCOSE 82 83 168*  BUN 45* 52* 49*  CREATININE 4.20*  3.71* 3.64*  CALCIUM  --  8.7 8.5   Liver Function Tests:  Recent Labs Lab 11/06/13 2000  AST 59*  ALT 136*  ALKPHOS 128*  BILITOT 0.2*  PROT 7.5  ALBUMIN 3.6   No results found for this basename: LIPASE, AMYLASE,  in the last 168 hours No results found for this basename: AMMONIA,  in the last 168 hours CBC:  Recent Labs Lab 11/06/13 1748 11/06/13 2000 11/07/13 0430  WBC  --  6.3 4.7  NEUTROABS  --  4.3  --   HGB 13.9 12.7* 11.4*  HCT 41.0 38.7* 34.6*  MCV  --  81.6 81.4  PLT  --  204 187   Cardiac Enzymes:  Recent Labs Lab 11/06/13 1938  TROPONINI <0.30   BNP (last 3 results)  Recent Labs  11/06/13 1938  PROBNP 1815.0*   CBG:  Recent Labs Lab 11/06/13 2234 11/06/13 2310 11/06/13 2348 11/07/13 0707  GLUCAP 72 127* 175* 140*    No results found for this or any previous visit (from the past 240 hour(s)).   Studies: Dg Chest 2 View  11/06/2013   CLINICAL DATA:  Shortness of breath.  Renal disorder.  EXAM: CHEST  2 VIEW  COMPARISON:  None.  FINDINGS: Enlarged cardiomediastinal silhouette. BILATERAL airspace opacity with BILATERAL pleural effusions suggesting pulmonary edema. No pneumothorax. No osseous lesions. Degenerative change thoracic spine. Trachea midline.  IMPRESSION:  Cardiomegaly with suspected pulmonary edema. BILATERAL pneumonia could have this appearance, but is less favored.   Electronically Signed   By: Davonna BellingJohn  Curnes M.D.   On: 11/06/2013 21:18    Scheduled Meds: . amLODipine  10 mg Oral Daily  . carvedilol  6.25 mg Oral BID WC  . docusate sodium  100 mg Oral BID  . furosemide  40 mg Intravenous BID  . heparin  5,000 Units Subcutaneous 3 times per day  . hydrALAZINE  25 mg Oral 3 times per day  . insulin aspart  0-9 Units Subcutaneous TID WC  . isosorbide-hydrALAZINE  1 tablet Oral BID  . pneumococcal 23 valent vaccine  0.5 mL Intramuscular Tomorrow-1000  . polyethylene glycol  17 g Oral BID  . potassium chloride  20 mEq Oral BID  .  senna  1 tablet Oral BID  . sodium chloride  3 mL Intravenous Q12H   Continuous Infusions:   Active Problems:   Hypertensive urgency   Pulmonary edema   Pleural effusion, bilateral   Acute renal failure superimposed on stage 3 chronic kidney disease   DM II (diabetes mellitus, type II), controlled   Hyperlipidemia   Accelerated hypertension    Time spent: 35 minutes.     [name], [name]  [facility] Pager [phone]. If 7PM-7AM, please contact night-coverage at [email or web address], password [email or web address] 11/07/2013, 10:17 AM  LOS: 1 day

## 2013-11-07 NOTE — Progress Notes (Signed)
Information regarding Hemodialysis printed from Exit Care.  The information given to patient relates to the hemodialysis procedure, vascular access, and care after dialysis.  Video educational system cannot be accessedl at this time.

## 2013-11-07 NOTE — Consult Note (Signed)
Michael Dorsey is an 68 y.o. male referred by Dr Sunnie Nielsen   Chief Complaint: CKD 4, vol overload HPI: 67yo BM from [place] admitted yest for SOB and volume overload.  BP also very high.  He had been getting BP meds from his PCP but says his PCP has been out of town and thus has not taken his meds x 2 weeks.  He saw Dr Hyman Hopes for the first time on 09/28/13 and Scr 3.7 (was 3.1 in 4/15).  Pr/Cr of 2044mg . Neg SPEP and UPEP.  PTH 109.  HG 11.7.  He also has a 5cm mass in Rt kidney and saw urology though not clear on what the plans are.  He is visiting and living with his daughters.  He has 6yr hx of DM/HTN.  Past Medical History  Diagnosis Date  . Hypertension   . Diabetes mellitus without complication   . Hypercholesteremia   . Renal disorder     in process of being diagnosed    History reviewed. No pertinent past surgical history.  History reviewed. No pertinent family history. FH neg for renal ds Social History:  reports that he has never smoked. He does not have any smokeless tobacco history on file. He reports that he does not drink alcohol or use illicit drugs.Visiting from [place] and he and his wife are living with their daughters.  Allergies: No Known Allergies  Medications Prior to Admission  Medication Sig Dispense Refill  . amLODipine (NORVASC) 10 MG tablet Take 10 mg by mouth daily.      Marland Kitchen amlodipine-olmesartan (AZOR) 10-20 MG per tablet Take 1 tablet by mouth daily.  30 tablet  3  . carvedilol (COREG) 6.25 MG tablet Take 1 tablet (6.25 mg total) by mouth 2 (two) times daily with a meal.  60 tablet  3  . Insulin Aspart Prot & Aspart (NOVOLOG MIX 70/30 FLEXPEN) (70-30) 100 UNIT/ML Pen Inject 15 Units into the skin 2 (two) times daily.  15 mL  11  . isosorbide-hydrALAZINE (BIDIL) 20-37.5 MG per tablet Take 1 tablet by mouth 2 (two) times daily.  60 tablet  3  . Lancet Device MISC 1 strip by Does not apply route daily as needed.       . rosuvastatin (CRESTOR) 10 MG tablet Take 10 mg  by mouth 2 (two) times daily.      Marland Kitchen glucose blood (BAYER CONTOUR NEXT TEST) test strip 1 each by Other route as needed for other (blood sugar). Use as instructed         Lab Results: UA: >300 protein  0-2wbc, 3-6rbc   Recent Labs  11/06/13 1748 11/06/13 2000 11/07/13 0430  WBC  --  6.3 4.7  HGB 13.9 12.7* 11.4*  HCT 41.0 38.7* 34.6*  PLT  --  204 187   BMET  Recent Labs  11/06/13 1748 11/06/13 2000 11/07/13 0430  NA 141 144 144  K 3.5* 3.7 3.4*  CL 110 106 106  CO2  --  20 22  GLUCOSE 82 83 168*  BUN 45* 52* 49*  CREATININE 4.20* 3.71* 3.64*  CALCIUM  --  8.7 8.5   LFT  Recent Labs  11/06/13 2000  PROT 7.5  ALBUMIN 3.6  AST 59*  ALT 136*  ALKPHOS 128*  BILITOT 0.2*   Dg Chest 2 View  11/06/2013   CLINICAL DATA:  Shortness of breath.  Renal disorder.  EXAM: CHEST  2 VIEW  COMPARISON:  None.  FINDINGS: Enlarged cardiomediastinal silhouette. BILATERAL airspace  opacity with BILATERAL pleural effusions suggesting pulmonary edema. No pneumothorax. No osseous lesions. Degenerative change thoracic spine. Trachea midline.  IMPRESSION: Cardiomegaly with suspected pulmonary edema. BILATERAL pneumonia could have this appearance, but is less favored.   Electronically Signed   By: Davonna BellingJohn  Curnes M.D.   On: 11/06/2013 21:18    ROS: No change in vision No SOB though on O@ No CP No abd pain Tingling in hands No dysuria  PHYSICAL EXAM: Blood pressure 186/60, pulse 91, temperature 98.5 F (36.9 C), temperature source Oral, resp. rate 18, height 5\' 10"  (1.778 m), weight 86.7 kg (191 lb 2.2 oz), SpO2 94.00%. HEENT: PERRLA EOMI NECK:No JVD LUNGS:decreased BS bases, bil crackles bases CARDIAC:RRR 1-2/6 systolic m LSB/apex, + S4 ABD:+ BS NTND No HSM EXT:1+ edema NEURO:CNI Ox3 no asterixis  Assessment: 1. CKD 4 sec DM/HTN 2. HTN poorly controlled sec to running out of BP meds 3. Vol overload 4. DM 5. Mild Sec HPTH (109) 6. Mild anemia 7. Rt renal mass 8. Mild  hypokalemia  PLAN: 1. IV lasix, will increase dose 2. Show him Dx videos 3. I discussed with him the inevitability of dialysis.  He will need it sooner than later if plan is nephrectomy for Rt renal mass.  Will need to get urology info.  Thus, he should probably get an access placed this hospitalization 4. Vein mapping 5. Resume BP meds 6. I/O's 7. Daily Scr 8. Check PO4 9. Replace K as you are doing   Hanako Tipping T 11/07/2013, 1:53 PM

## 2013-11-07 NOTE — Plan of Care (Signed)
Problem: Consults Goal: Heart Failure Patient Education (See Patient Education module for education specifics.)  Outcome: Progressing Review of HF education.  Problem: Phase I Progression Outcomes Goal: Up in chair, BRP Outcome: Progressing Walking without SOB.

## 2013-11-08 ENCOUNTER — Inpatient Hospital Stay (HOSPITAL_COMMUNITY): Payer: Self-pay

## 2013-11-08 DIAGNOSIS — I319 Disease of pericardium, unspecified: Secondary | ICD-10-CM

## 2013-11-08 DIAGNOSIS — N184 Chronic kidney disease, stage 4 (severe): Secondary | ICD-10-CM

## 2013-11-08 DIAGNOSIS — N19 Unspecified kidney failure: Secondary | ICD-10-CM

## 2013-11-08 DIAGNOSIS — M7989 Other specified soft tissue disorders: Secondary | ICD-10-CM

## 2013-11-08 LAB — RENAL FUNCTION PANEL
ALBUMIN: 3.1 g/dL — AB (ref 3.5–5.2)
Anion gap: 12 (ref 5–15)
BUN: 46 mg/dL — ABNORMAL HIGH (ref 6–23)
CALCIUM: 8.3 mg/dL — AB (ref 8.4–10.5)
CO2: 27 mEq/L (ref 19–32)
CREATININE: 3.74 mg/dL — AB (ref 0.50–1.35)
Chloride: 106 mEq/L (ref 96–112)
GFR calc non Af Amer: 15 mL/min — ABNORMAL LOW (ref >90)
GFR, EST AFRICAN AMERICAN: 18 mL/min — AB (ref >90)
Glucose, Bld: 103 mg/dL — ABNORMAL HIGH (ref 70–99)
Phosphorus: 4.3 mg/dL (ref 2.3–4.6)
Potassium: 3.4 mEq/L — ABNORMAL LOW (ref 3.7–5.3)
SODIUM: 145 meq/L (ref 137–147)

## 2013-11-08 LAB — GLUCOSE, CAPILLARY
GLUCOSE-CAPILLARY: 77 mg/dL (ref 70–99)
GLUCOSE-CAPILLARY: 112 mg/dL — AB (ref 70–99)
GLUCOSE-CAPILLARY: 204 mg/dL — AB (ref 70–99)
Glucose-Capillary: 121 mg/dL — ABNORMAL HIGH (ref 70–99)

## 2013-11-08 MED ORDER — ISOSORBIDE MONONITRATE ER 30 MG PO TB24
30.0000 mg | ORAL_TABLET | Freq: Every day | ORAL | Status: DC
  Administered 2013-11-09 – 2013-11-11 (×3): 30 mg via ORAL
  Filled 2013-11-08 (×3): qty 1

## 2013-11-08 MED ORDER — FUROSEMIDE 80 MG PO TABS
160.0000 mg | ORAL_TABLET | Freq: Two times a day (BID) | ORAL | Status: DC
  Administered 2013-11-08 – 2013-11-11 (×6): 160 mg via ORAL
  Filled 2013-11-08 (×9): qty 2

## 2013-11-08 MED ORDER — POTASSIUM CHLORIDE CRYS ER 20 MEQ PO TBCR
40.0000 meq | EXTENDED_RELEASE_TABLET | Freq: Two times a day (BID) | ORAL | Status: DC
  Administered 2013-11-08 – 2013-11-11 (×7): 40 meq via ORAL
  Filled 2013-11-08 (×7): qty 2

## 2013-11-08 MED ORDER — HYDRALAZINE HCL 25 MG PO TABS
25.0000 mg | ORAL_TABLET | Freq: Three times a day (TID) | ORAL | Status: DC
  Administered 2013-11-08 – 2013-11-09 (×4): 25 mg via ORAL
  Filled 2013-11-08 (×6): qty 1

## 2013-11-08 NOTE — Progress Notes (Signed)
[facility] PROGRESS NOTE  Michael Dorsey ZOX:[id number] DOB: 09-12-1945 DOA: 11/06/2013 PCP: Jackie Plum, MD  Assessment/Plan: 1-Acute Respiratory Failure; related to pulmonary edema, related to renal failure. Also component of Heart failure.  Continue with lasix, ECHO pending.  Will need to repeat Chest x ray to follow up pleural effusion.   2-CKD, stage IV: Cr at 3.7, Continue with lasix. Nephrologist consulted.  -Hold ACE.  -lasix change to oral.  -Vein map ordered.   3-? Renal Mass; needs to follow up with urology.   4-Diabetes; hold 70/30. Continue with SSI. Hold lantus CBG low at 70.   5-Hypertension; continue with Norvasc,  Coreg, lasix. Change Bidil to hydralazine 25 mg TID and Imdur 30 daily.   6-Bilateral LE edema,worse on the right:  Dopplers pending.   Code Status: Full Code.  Family Communication: Care discussed with patient.  Disposition Plan: Home when stable.    Consultants:  Nephrologist.   Procedures:  Doppler LE:   ECHO P   Antibiotics:  none  HPI/Subjective: He is feeling better. Dyspnea improved.    Objective: Filed Vitals:   11/08/13 1119  BP: 201/98  Pulse: 86  Temp:   Resp:     Intake/Output Summary (Last 24 hours) at 11/08/13 1343 Last data filed at 11/08/13 0841  Gross per 24 hour  Intake    970 ml  Output   2050 ml  Net  -1080 ml   Filed Weights   11/06/13 2343 11/07/13 0507 11/08/13 0556  Weight: 86.728 kg (191 lb 3.2 oz) 86.7 kg (191 lb 2.2 oz) 80.786 kg (178 lb 1.6 oz)    Exam:   General:  No distress.   Cardiovascular: S 1, S 2 RRR  Respiratory: Crackles bases.   Abdomen: BS present, soft, NT  Musculoskeletal:Bilateral LE edema decreasing, bilateral hyperpigmentation.   Data Reviewed: Basic Metabolic Panel:  Recent Labs Lab 11/06/13 1748 11/06/13 2000 11/07/13 0430 11/07/13 0438 11/08/13 0354  NA 141 144 144  --  145  K 3.5* 3.7 3.4*  --  3.4*  CL 110 106 106  --  106  CO2  --   20 22  --  27  GLUCOSE 82 83 168*  --  103*  BUN 45* 52* 49*  --  46*  CREATININE 4.20* 3.71* 3.64*  --  3.74*  CALCIUM  --  8.7 8.5  --  8.3*  PHOS  --   --   --  4.1 4.3   Liver Function Tests:  Recent Labs Lab 11/06/13 2000 11/08/13 0354  AST 59*  --   ALT 136*  --   ALKPHOS 128*  --   BILITOT 0.2*  --   PROT 7.5  --   ALBUMIN 3.6 3.1*   No results found for this basename: LIPASE, AMYLASE,  in the last 168 hours No results found for this basename: AMMONIA,  in the last 168 hours CBC:  Recent Labs Lab 11/06/13 1748 11/06/13 2000 11/07/13 0430  WBC  --  6.3 4.7  NEUTROABS  --  4.3  --   HGB 13.9 12.7* 11.4*  HCT 41.0 38.7* 34.6*  MCV  --  81.6 81.4  PLT  --  204 187   Cardiac Enzymes:  Recent Labs Lab 11/06/13 1938  TROPONINI <0.30   BNP (last 3 results)  Recent Labs  11/06/13 1938  PROBNP 1815.0*   CBG:  Recent Labs Lab 11/07/13 1108 11/07/13 1631 11/07/13 2207 11/08/13 0614 11/08/13 1122  GLUCAP 169* 195* 141* 77  112*    No results found for this or any previous visit (from the past 240 hour(s)).   Studies: Dg Chest 2 View  11/06/2013   CLINICAL DATA:  Shortness of breath.  Renal disorder.  EXAM: CHEST  2 VIEW  COMPARISON:  None.  FINDINGS: Enlarged cardiomediastinal silhouette. BILATERAL airspace opacity with BILATERAL pleural effusions suggesting pulmonary edema. No pneumothorax. No osseous lesions. Degenerative change thoracic spine. Trachea midline.  IMPRESSION: Cardiomegaly with suspected pulmonary edema. BILATERAL pneumonia could have this appearance, but is less favored.   Electronically Signed   By: Davonna BellingJohn  Curnes M.D.   On: 11/06/2013 21:18    Scheduled Meds: . amLODipine  10 mg Oral Daily  . carvedilol  6.25 mg Oral BID WC  . docusate sodium  100 mg Oral BID  . furosemide  160 mg Oral BID  . heparin  5,000 Units Subcutaneous 3 times per day  . hydrALAZINE  25 mg Oral 3 times per day  . insulin aspart  0-9 Units Subcutaneous TID WC   . [START ON 11/09/2013] isosorbide mononitrate  30 mg Oral Daily  . polyethylene glycol  17 g Oral BID  . potassium chloride  40 mEq Oral BID  . senna  1 tablet Oral BID  . sodium chloride  3 mL Intravenous Q12H   Continuous Infusions:   Active Problems:   Hypertensive urgency   Pulmonary edema   Pleural effusion, bilateral   Acute renal failure superimposed on stage 3 chronic kidney disease   DM II (diabetes mellitus, type II), controlled   Hyperlipidemia   Accelerated hypertension    Time spent: 30 minutes.     [name], Laela Deviney A  [facility] Pager [phone]. If 7PM-7AM, please contact night-coverage at [email or web address], password [email or web address] 11/08/2013, 1:43 PM  LOS: 2 days

## 2013-11-08 NOTE — Progress Notes (Signed)
VASCULAR LAB PRELIMINARY  PRELIMINARY  PRELIMINARY  PRELIMINARY  Bilateral lower extremity venous Dopplers completed.    Preliminary report:  There is no DVT or SVT noted in the bilateral lower extremities.   Sharanda Shinault, RVT 11/08/2013, 5:41 PM

## 2013-11-08 NOTE — Consult Note (Signed)
Patient name: Michael BookmanJohn Anane-Brobbey MRN: [id number] DOB: [birth year] Sex: male   Referred by: D[place]  Reason for referral:  Chief Complaint  Patient presents with  . Hypertension   hemodialysis access  HISTORY OF PRESENT ILLNESS: Patient 68 year old gentleman who [place] approximately 6 [place] ago. He was admitted with hypertension out of control. Found to have progressive chronic renal insufficiency. Prior history of this is not available. We are consult and for hemodialysis access planning. Patient has very poor understanding of this.  Past Medical History  Diagnosis Date  . Hypertension   . Diabetes mellitus without complication   . Hypercholesteremia   . Renal disorder     in process of being diagnosed    History reviewed. No pertinent past surgical history.  History   Social History  . Marital Status: Married    Spouse Name: N/A    Number of Children: N/A  . Years of Education: N/A   Occupational History  . Not on file.   Social History Main Topics  . Smoking status: Never Smoker   . Smokeless tobacco: Not on file  . Alcohol Use: No  . Drug Use: No  . Sexual Activity: Not Currently   Other Topics Concern  . Not on file   Social History Narrative  . No narrative on file    History reviewed. No pertinent family history.  Allergies as of 11/06/2013  . (No Known Allergies)    No current facility-administered medications on file prior to encounter.   Current Outpatient Prescriptions on File Prior to Encounter  Medication Sig Dispense Refill  . amLODipine (NORVASC) 10 MG tablet Take 10 mg by mouth daily.      Marland Kitchen. amlodipine-olmesartan (AZOR) 10-20 MG per tablet Take 1 tablet by mouth daily.  30 tablet  3  . carvedilol (COREG) 6.25 MG tablet Take 1 tablet (6.25 mg total) by mouth 2 (two) times daily with a meal.  60 tablet  3  . Insulin Aspart Prot & Aspart (NOVOLOG MIX 70/30 FLEXPEN) (70-30) 100 UNIT/ML Pen Inject 15  Units into the skin 2 (two) times daily.  15 mL  11  . isosorbide-hydrALAZINE (BIDIL) 20-37.5 MG per tablet Take 1 tablet by mouth 2 (two) times daily.  60 tablet  3  . Lancet Device MISC 1 strip by Does not apply route daily as needed.       Marland Kitchen. glucose blood (BAYER CONTOUR NEXT TEST) test strip 1 each by Other route as needed for other (blood sugar). Use as instructed         REVIEW OF SYSTEMS:  Positives indicated with an "X"  CARDIOVASCULAR:  [ ]  chest pain   [ ]  chest pressure   [ ]  palpitations   [ ]  orthopnea   [ ]  dyspnea on exertion   [ ]  claudication   [ ]  rest pain   [ ]  DVT   [ ]  phlebitis PULMONARY:   [ ]  productive cough   [ ]  asthma   [ ]  wheezing NEUROLOGIC:   [ ]  weakness  [ ]  paresthesias  [ ]  aphasia  [ ]  amaurosis  [ ]  dizziness HEMATOLOGIC:   [ ]  bleeding problems   [ ]  clotting disorders MUSCULOSKELETAL:  [ ]  joint pain   [ ]  joint swelling GASTROINTESTINAL: [ ]   blood in stool  [ ]   hematemesis GENITOURINARY:  [ ]   dysuria  [ ]   hematuria PSYCHIATRIC:  [ ]   history of major depression INTEGUMENTARY:  [ ]  rashes  [ ]  ulcers CONSTITUTIONAL:  [ ]  fever   [ ]  chills  PHYSICAL EXAMINATION:  General: The patient is a well-nourished male, in no acute distress. Vital signs are BP 190/98  Pulse 86  Temp(Src) 98.5 F (36.9 C) (Oral)  Resp 18  Ht 5\' 10"  (1.778 m)  Wt 178 lb 1.6 oz (80.786 kg)  BMI 25.55 kg/m2  SpO2 97% Pulmonary: There is a good air exchange  Abdomen: Soft and non-tender Musculoskeletal: There are no major deformities.  There is no significant extremity pain. Neurologic: No focal weakness or paresthesias are detected, Skin: There are no ulcer or rashes noted. Psychiatric: The patient has normal affect. Cardiovascular: Palpable radial pulses bilaterally The patient's has very small surface veins.    Vascular Lab Studies:  Vein map is pending  Impression and Plan:  Chronic renal insufficiency seen for hemodialysis access planning. I discussed  options to include hemodialysis catheter, AV fistula an AV graft. The patient is right-handed. He has a small surface veins bilaterally with vein map pending. I did explain the need this in our role for hemodialysis access although is understanding of this is quite poor. We will follow along with you. I did explain that this would be done as an outpatient if he is discharged within the next several days. Could be done as an inpatient if he is still in the hospital.    Georgine Wiltse [facility] Office: [phone]

## 2013-11-08 NOTE — Progress Notes (Signed)
Right  Upper Extremity Vein Map    Cephalic  Segment Diameter Depth Comment  1. Axilla 4mm mm   2. Mid upper arm 4.912mm mm   3. Above AC 4.522mm mm   4. [facility] 5.197[facility] mm   5. Below AC mm mm bandages  6. Mid forearm 2.78mm mm   7. Wrist 2.424mm mm    mm mm    mm mm    mm mm    Basilic  Segment Diameter Depth Comment  1. Axilla 2.309mm 7.797mm   2. Mid upper arm 2.67mm 8mm   3. Above AC 3.316mm 5.728mm   4. In [facility] 3.496[facility] 4.556mm   5. Below AC 1.464mm 2mm   6. Mid forearm mm mm Too small  7. Wrist mm mm Too small   mm mm    mm mm    mm mm   Left Upper Extremity Vein Map    Cephalic  Segment Diameter Depth Comment  1. Axilla 3.174mm mm branch  2. Mid upper arm 3.393mm mm   3. Above AC 3.403mm mm   4. In AC 4.279mm mm   5. Below AC 1.637mm mm   6. Mid forearm 1.389mm mm   7. Wrist mm mm branch   mm mm    mm mm    mm mm    Basilic  Segment Diameter Depth Comment  1. Axilla 4mm 3.631mm   2. Mid upper arm 2.99mm 1.457mm   3. Above AC 3.95mm 2.814mm   4. In [facility] 4.357mm 1.269mm   5. Below AC 3.292mm 8.567mm   6. Mid forearm 3.883mm 10mm   7. Wrist mm mm Too small   mm mm    mm mm    mm mm

## 2013-11-08 NOTE — Progress Notes (Signed)
Echocardiogram 2D Echocardiogram has been performed.  Dorothey BasemanReel, Gibran Veselka M 11/08/2013, 11:39 AM

## 2013-11-08 NOTE — Progress Notes (Signed)
Patient BP elevated at 194/96. PRN Hydralazine administered as ordered. Will continue to monitor. Michael Dorsey, [name] M

## 2013-11-08 NOTE — Evaluation (Signed)
Physical Therapy Evaluation Patient Details Name: Michael BookmanJohn Dorsey MRN: [id number] DOB: 31-Dec-1945 Today's Date: 11/08/2013   History of Present Illness  Patient is a 68 yo male admitted 11/06/13 with hypertensive urgency and CHF exacerbation.  PMH: HTN, DM, renal disorder.  Clinical Impression  Patient is independent with all mobility and gait.  Balance is good during gait.  No acute PT needs identified - PT will sign off.    Follow Up Recommendations No PT follow up;Supervision - Intermittent    Equipment Recommendations  None recommended by PT    Recommendations for Other Services       Precautions / Restrictions Precautions Precautions: None Restrictions Weight Bearing Restrictions: No      Mobility  Bed Mobility Overal bed mobility: Independent                Transfers Overall transfer level: Independent Equipment used: None                Ambulation/Gait Ambulation/Gait assistance: Independent Ambulation Distance (Feet): 250 Feet Assistive device: None Gait Pattern/deviations: WFL(Within Functional Limits) Gait velocity: WFL Gait velocity interpretation: at or above normal speed for age/gender General Gait Details: Patient demonstrates good gait pattern, speed, and balance.  Patient ambulated on room air, with O2 sat at 97% - RN notified.  Stairs            Wheelchair Mobility    Modified Rankin (Stroke Patients Only)       Balance Overall balance assessment: No apparent balance deficits (not formally assessed) (Able to don pants in standing without loss of balance)                                           Pertinent Vitals/Pain Pain Assessment: No/denies pain O2 sats at 97% with ambulation on room air    Home Living Family/patient expects to be discharged to:: Private residence Living Arrangements: Children Available Help at Discharge: Family;Available 24 hours/day Type of Home: House (Daughter's home)          Home Equipment: None      Prior Function Level of Independence: Independent               Hand Dominance        Extremity/Trunk Assessment   Upper Extremity Assessment: Overall WFL for tasks assessed           Lower Extremity Assessment: Overall WFL for tasks assessed      Cervical / Trunk Assessment: Normal  Communication   Communication: No difficulties  Cognition Arousal/Alertness: Awake/alert Behavior During Therapy: WFL for tasks assessed/performed Overall Cognitive Status: Within Functional Limits for tasks assessed                      General Comments      Exercises        Assessment/Plan    PT Assessment Patent does not need any further PT services  PT Diagnosis     PT Problem List    PT Treatment Interventions     PT Goals (Current goals can be found in the Care Plan section) Acute Rehab PT Goals PT Goal Formulation: No goals set, d/c therapy    Frequency     Barriers to discharge        Co-evaluation               End  of Session Equipment Utilized During Treatment: Gait belt Activity Tolerance: Patient tolerated treatment well Patient left: in bed;with call bell/phone within reach (sitting EOB) Nurse Communication: Mobility status (O2 sat 97% on room air with ambulation)         Time: 6045-4098 PT Time Calculation (min): 16 min   Charges:   PT Evaluation $Initial PT Evaluation Tier I: 1 Procedure PT Treatments $Gait Training: 8-22 mins   PT G CodesVena Austria 11/08/2013, 3:10 PM Durenda Hurt. Renaldo Fiddler, [facility] Acute Rehab Services Pager [phone]

## 2013-11-08 NOTE — Progress Notes (Signed)
Subjective: Interval History: has no complaint but does not understand dialysis.  Objective: Vital signs in last 24 hours: Temp:  [98.3 F (36.8 C)-98.7 F (37.1 C)] 98.5 F (36.9 C) (08/10 0556) Pulse Rate:  [83-86] 86 (08/10 0556) Resp:  [18] 18 (08/10 0556) BP: (180-194)/(90-96) 180/90 mmHg (08/10 0556) SpO2:  [99 %-100 %] 100 % (08/10 0556) Weight:  [80.786 kg (178 lb 1.6 oz)] 80.786 kg (178 lb 1.6 oz) (08/10 0556) Weight change: -5.851 kg (-12 lb 14.4 oz)  Intake/Output from previous day: 08/09 0701 - 08/10 0700 In: 1010 [P.O.:1010] Out: 3100 [Urine:3100] Intake/Output this shift: Total I/O In: 360 [P.O.:360] Out: -   General appearance: alert, cooperative and no distress Resp: rales bibasilar Cardio: regular rate and rhythm and systolic murmur: holosystolic 2/6, blowing at apex GI: pos bs, liver down 4-5 cm Extremities: edema 2+  Lab Results:  Recent Labs  11/06/13 2000 11/07/13 0430  WBC 6.3 4.7  HGB 12.7* 11.4*  HCT 38.7* 34.6*  PLT 204 187   BMET:  Recent Labs  11/07/13 0430 11/08/13 0354  NA 144 145  K 3.4* 3.4*  CL 106 106  CO2 22 27  GLUCOSE 168* 103*  BUN 49* 46*  CREATININE 3.64* 3.74*  CALCIUM 8.5 8.3*   No results found for this basename: PTH,  in the last 72 hours Iron Studies: No results found for this basename: IRON, TIBC, TRANSFERRIN, FERRITIN,  in the last 72 hours  Studies/Results: Dg Chest 2 View  11/06/2013   CLINICAL DATA:  Shortness of breath.  Renal disorder.  EXAM: CHEST  2 VIEW  COMPARISON:  None.  FINDINGS: Enlarged cardiomediastinal silhouette. BILATERAL airspace opacity with BILATERAL pleural effusions suggesting pulmonary edema. No pneumothorax. No osseous lesions. Degenerative change thoracic spine. Trachea midline.  IMPRESSION: Cardiomegaly with suspected pulmonary edema. BILATERAL pneumonia could have this appearance, but is less favored.   Electronically Signed   By: Davonna BellingJohn  Curnes M.D.   On: 11/06/2013 21:18    I have  reviewed the patient's current medications.  Assessment/Plan: 1 CKD 4  Needs access soon but could do outpatient. Vol better, acid/base ok.  K beting repleted.Need Urology notes, will obtain.  If Nx will need HD sooner. 2 HTN better, cont meds and diuresis 3 Anemia does not need tx at this time 4 HPTH see if was done in office 5 Education 6 Renal Mass get eval P Educate, vein map ,VVS to see, ^ Lasix and do po.  Discussed with primary MD    LOS: 2 days   Michael Dorsey L 11/08/2013,10:07 AM

## 2013-11-09 LAB — COMPREHENSIVE METABOLIC PANEL
ALT: 68 U/L — ABNORMAL HIGH (ref 0–53)
AST: 26 U/L (ref 0–37)
Albumin: 3.5 g/dL (ref 3.5–5.2)
Alkaline Phosphatase: 105 U/L (ref 39–117)
Anion gap: 17 — ABNORMAL HIGH (ref 5–15)
BUN: 40 mg/dL — AB (ref 6–23)
CALCIUM: 8.9 mg/dL (ref 8.4–10.5)
CO2: 23 mEq/L (ref 19–32)
Chloride: 105 mEq/L (ref 96–112)
Creatinine, Ser: 3.68 mg/dL — ABNORMAL HIGH (ref 0.50–1.35)
GFR calc non Af Amer: 15 mL/min — ABNORMAL LOW (ref >90)
GFR, EST AFRICAN AMERICAN: 18 mL/min — AB (ref >90)
GLUCOSE: 118 mg/dL — AB (ref 70–99)
Potassium: 3.6 mEq/L — ABNORMAL LOW (ref 3.7–5.3)
Sodium: 145 mEq/L (ref 137–147)
TOTAL PROTEIN: 7.4 g/dL (ref 6.0–8.3)
Total Bilirubin: 0.3 mg/dL (ref 0.3–1.2)

## 2013-11-09 LAB — GLUCOSE, CAPILLARY
Glucose-Capillary: 100 mg/dL — ABNORMAL HIGH (ref 70–99)
Glucose-Capillary: 242 mg/dL — ABNORMAL HIGH (ref 70–99)
Glucose-Capillary: 206 mg/dL — ABNORMAL HIGH (ref 70–99)
Glucose-Capillary: 252 mg/dL — ABNORMAL HIGH (ref 70–99)

## 2013-11-09 LAB — PHOSPHORUS: Phosphorus: 3.8 mg/dL (ref 2.3–4.6)

## 2013-11-09 MED ORDER — CARVEDILOL 12.5 MG PO TABS
12.5000 mg | ORAL_TABLET | Freq: Two times a day (BID) | ORAL | Status: DC
  Administered 2013-11-09: 12.5 mg via ORAL
  Filled 2013-11-09 (×4): qty 1

## 2013-11-09 MED ORDER — CALCITRIOL 0.25 MCG PO CAPS
0.2500 ug | ORAL_CAPSULE | Freq: Every day | ORAL | Status: DC
  Administered 2013-11-09 – 2013-11-11 (×3): 0.25 ug via ORAL
  Filled 2013-11-09 (×3): qty 1

## 2013-11-09 MED ORDER — HYDRALAZINE HCL 50 MG PO TABS
50.0000 mg | ORAL_TABLET | Freq: Three times a day (TID) | ORAL | Status: DC
  Administered 2013-11-09 – 2013-11-11 (×6): 50 mg via ORAL
  Filled 2013-11-09 (×8): qty 1

## 2013-11-09 MED ORDER — DEXTROSE 5 % IV SOLN
1.5000 g | INTRAVENOUS | Status: AC
  Administered 2013-11-10: 1.5 g via INTRAVENOUS
  Filled 2013-11-09: qty 1.5

## 2013-11-09 NOTE — Progress Notes (Signed)
Subjective: Interval History: has complaints, not sure of finances, setting up all his procedures, what they involve, and his status in this country..  Objective: Vital signs in last 24 hours: Temp:  [97.7 F (36.5 C)-99.3 F (37.4 C)] 99.3 F (37.4 C) (08/11 0640) Pulse Rate:  [83-91] 88 (08/11 0640) Resp:  [18-19] 18 (08/11 0640) BP: (162-201)/(75-98) 170/75 mmHg (08/11 0640) SpO2:  [97 %-100 %] 100 % (08/11 0640) Weight:  [78.5 kg (173 lb 1 oz)] 78.5 kg (173 lb 1 oz) (08/11 0640) Weight change: -2.286 kg (-5 lb 0.6 oz)  Intake/Output from previous day: 08/10 0701 - 08/11 0700 In: 600 [P.O.:600] Out: 1995 [Urine:1995] Intake/Output this shift: Total I/O In: 320 [P.O.:320] Out: -   General appearance: alert, cooperative and no distress Resp: rales bibasilar Cardio: regular rate and rhythm and systolic murmur: holosystolic 2/6, blowing at apex GI: pos bs, soft, liver down 4 cm Extremities: edema tr  Lab Results:  Recent Labs  11/06/13 2000 11/07/13 0430  WBC 6.3 4.7  HGB 12.7* 11.4*  HCT 38.7* 34.6*  PLT 204 187   BMET:  Recent Labs  11/08/13 0354 11/09/13 0511  NA 145 145  K 3.4* 3.6*  CL 106 105  CO2 27 23  GLUCOSE 103* 118*  BUN 46* 40*  CREATININE 3.74* 3.68*  CALCIUM 8.3* 8.9   No results found for this basename: PTH,  in the last 72 hours Iron Studies: No results found for this basename: IRON, TIBC, TRANSFERRIN, FERRITIN,  in the last 72 hours  Studies/Results: Dg Chest 2 View  11/08/2013   CLINICAL DATA:  Pleural effusion.  EXAM: CHEST  2 VIEW  COMPARISON:  11/06/2013  FINDINGS: Low lung volumes. Mildly enlarged cardiopericardial silhouette. Mild tortuosity of the thoracic aorta. Mild prominence of the central pulmonary vasculature with cephalization of blood flow.  Reduced perihilar and basilar airspace opacities. Mild blunting of both posterior costophrenic angles.  Thoracic spondylosis.  IMPRESSION: 1. Cardiomegaly with pulmonary venous  hypertension but improved edema. 2. Small bilateral pleural effusions. 3. Hilar prominence probably due to vasculature, adenopathy considered less likely. Low lung volumes contribute to this appearance.   Electronically Signed   By: Herbie BaltimoreWalt  Liebkemann M.D.   On: 11/08/2013 16:48    I have reviewed the patient's current medications.  Assessment/Plan: 1  CKD4 late stage.  Vol improving on po diuretics. Complex situation. Needs to get access ASAP.  will need Nx/partial Nx for Renal tumor and will probably be dialysis dependent after.  Would like access to mature as much as poss.  The social issues need to be addressed and have discussed with Primary MD here. 2 HTN improving with diuresis . Will ^ coreg 3 Anemia 4 DM controlled 5 HPTH add vit D 6 Social status P Access, Vit D, SW to see, ^ Coreg    LOS: 3 days   Kaylub Detienne L 11/09/2013,9:37 AM

## 2013-11-09 NOTE — Progress Notes (Addendum)
[facility] PROGRESS NOTE  Michael Dorsey ZOX:[id number] DOB: [birth year] DOA: 11/06/2013 PCP: Michael Plum, MD  Assessment/Plan: 68 year old from [place], with PMH od CKD, ? Renal mass, follow up with urology, prior records available, who presents with dyspnea, respiratory failure, volume overload.   1-Acute Respiratory Failure; Related to pulmonary edema, related to renal failure. Also component of Heart failure.  Continue with lasix, ECHO showed diastolic dysfunction.  Chest x ray with improved edema, and pleural effusion.   2-Acute on chronic diastolic Heart Failure: improving on lasix.   3-CKD, stage IV: Cr at 3.7, Continue with lasix. Nephrologist consulted.  -Hold ACE.  -lasix change to oral.  -Vein map ordered.  -plan for permanent access for dialysis tomorrow.    4-? Renal Mass; needs to follow up with urology. Concern that after surgery will required dialysis.   5-Diabetes; hold 70/30. Continue with SSI. Hold lantus CBG low at 70.   6-Hypertension; continue with Norvasc,  Coreg, lasix. Change Bidil to hydralazine 25 mg TID and Imdur 30 daily. Follow up BP trend. Will increase hydralazine today to 50 mg TID.   7-Bilateral LE edema,worse on the right:  Dopplers negative for DVT.   8-Social Situation: I have ask SW to see patient.    Code Status: Full Code.  Family Communication: Care discussed with patient.  Disposition Plan: Home when stable.    Consultants:  Nephrologist.   Procedures:  Doppler LE: negative for DVT ECHO;  - Normal LV function; moderate LVH; grade 2 diastolic dysfunction; severe LAE; mild RAE; mild MR; mild to moderately elevated pulmonary pressures; small pericardial effusion; probable liver cyst.    Antibiotics:  none  HPI/Subjective: He is feeling better. Dyspnea improved.    Objective: Filed Vitals:   11/09/13 1410  BP: 153/74  Pulse: 78  Temp: 98.6 F (37 C)  Resp: 18    Intake/Output Summary (Last 24 hours)  at 11/09/13 1730 Last data filed at 11/09/13 1500  Gross per 24 hour  Intake    880 ml  Output   2120 ml  Net  -1240 ml   Filed Weights   11/07/13 0507 11/08/13 0556 11/09/13 0640  Weight: 86.7 kg (191 lb 2.2 oz) 80.786 kg (178 lb 1.6 oz) 78.5 kg (173 lb 1 oz)    Exam:   General:  No distress.   Cardiovascular: S 1, S 2 RRR  Respiratory: Crackles bases.   Abdomen: BS present, soft, NT  Musculoskeletal:trace edema,  bilateral hyperpigmentation.   Data Reviewed: Basic Metabolic Panel:  Recent Labs Lab 11/06/13 1748 11/06/13 2000 11/07/13 0430 11/07/13 0438 11/08/13 0354 11/09/13 0511  NA 141 144 144  --  145 145  K 3.5* 3.7 3.4*  --  3.4* 3.6*  CL 110 106 106  --  106 105  CO2  --  20 22  --  27 23  GLUCOSE 82 83 168*  --  103* 118*  BUN 45* 52* 49*  --  46* 40*  CREATININE 4.20* 3.71* 3.64*  --  3.74* 3.68*  CALCIUM  --  8.7 8.5  --  8.3* 8.9  PHOS  --   --   --  4.1 4.3 3.8   Liver Function Tests:  Recent Labs Lab 11/06/13 2000 11/08/13 0354 11/09/13 0511  AST 59*  --  26  ALT 136*  --  68*  ALKPHOS 128*  --  105  BILITOT 0.2*  --  0.3  PROT 7.5  --  7.4  ALBUMIN 3.6 3.1*  3.5   No results found for this basename: LIPASE, AMYLASE,  in the last 168 hours No results found for this basename: AMMONIA,  in the last 168 hours CBC:  Recent Labs Lab 11/06/13 1748 11/06/13 2000 11/07/13 0430  WBC  --  6.3 4.7  NEUTROABS  --  4.3  --   HGB 13.9 12.7* 11.4*  HCT 41.0 38.7* 34.6*  MCV  --  81.6 81.4  PLT  --  204 187   Cardiac Enzymes:  Recent Labs Lab 11/06/13 1938  TROPONINI <0.30   BNP (last 3 results)  Recent Labs  11/06/13 1938  PROBNP 1815.0*   CBG:  Recent Labs Lab 11/08/13 1714 11/08/13 2133 11/09/13 0559 11/09/13 1100 11/09/13 1559  GLUCAP 204* 121* 100* 242* 206*    No results found for this or any previous visit (from the past 240 hour(s)).   Studies: Dg Chest 2 View  11/08/2013   CLINICAL DATA:  Pleural effusion.   EXAM: CHEST  2 VIEW  COMPARISON:  11/06/2013  FINDINGS: Low lung volumes. Mildly enlarged cardiopericardial silhouette. Mild tortuosity of the thoracic aorta. Mild prominence of the central pulmonary vasculature with cephalization of blood flow.  Reduced perihilar and basilar airspace opacities. Mild blunting of both posterior costophrenic angles.  Thoracic spondylosis.  IMPRESSION: 1. Cardiomegaly with pulmonary venous hypertension but improved edema. 2. Small bilateral pleural effusions. 3. Hilar prominence probably due to vasculature, adenopathy considered less likely. Low lung volumes contribute to this appearance.   Electronically Signed   By: Herbie BaltimoreWalt  Liebkemann M.D.   On: 11/08/2013 16:48    Scheduled Meds: . amLODipine  10 mg Oral Daily  . calcitRIOL  0.25 mcg Oral Daily  . carvedilol  12.5 mg Oral BID WC  . [START ON 11/10/2013] cefUROXime (ZINACEF)  IV  1.5 g Intravenous On Call to OR  . docusate sodium  100 mg Oral BID  . furosemide  160 mg Oral BID  . heparin  5,000 Units Subcutaneous 3 times per day  . hydrALAZINE  25 mg Oral 3 times per day  . insulin aspart  0-9 Units Subcutaneous TID WC  . isosorbide mononitrate  30 mg Oral Daily  . polyethylene glycol  17 g Oral BID  . potassium chloride  40 mEq Oral BID  . senna  1 tablet Oral BID  . sodium chloride  3 mL Intravenous Q12H   Continuous Infusions:   Active Problems:   Hypertensive urgency   Pulmonary edema   Pleural effusion, bilateral   Acute renal failure superimposed on stage 3 chronic kidney disease   DM II (diabetes mellitus, type II), controlled   Hyperlipidemia   Accelerated hypertension    Time spent: 30 minutes.     [name], [name]  [facility] Pager [phone]. If 7PM-7AM, please contact night-coverage at [email or web address], password [email or web address] 11/09/2013, 5:30 PM  LOS: 3 days

## 2013-11-09 NOTE — Progress Notes (Signed)
        Spoke with patient and informed him of the plan to perform the creation of a left arm AV fistula and placement of Diatek catheter tomorrow.  His Daughter was present and they agree to go ahead with the surgery.  Nandana Krolikowski MAUREEN PA-C 

## 2013-11-09 NOTE — Clinical Documentation Improvement (Signed)
H&P/Notes state "CHF", "pulmonary edema" and "volume overload". Please assign both Type and Acuity to add specificity to reflect severity of illness and risk of mortality.  Possible Clinical Conditions?  - Chronic Systolic Congestive Heart Failure  - Chronic Diastolic Congestive Heart Failure  - Chronic Systolic & Diastolic Congestive Heart Failure  - Acute Systolic Congestive Heart Failure  - Acute Diastolic Congestive Heart Failure  - Acute Systolic & Diastolic Congestive Heart Failure  - Acute on Chronic Systolic Congestive Heart Failure  - Acute on Chronic Diastolic Congestive Heart Failure  - Acute on Chronic Systolic & Diastolic Congestive Heart Failure  - Other Condition Due to: - Hypertensive Heart Disease - Hypertensive Heart and Kidney Disease - Other Condition  Supporting Information:  - CKD stage IV  - Accelerated HTN  - Lasix 160mg  qd   Thank Harle BattiestYou,  Larosa Rhines RN CDIS [phone]

## 2013-11-09 NOTE — Clinical Documentation Improvement (Signed)
  H&P/Notes state "CHF", "pulmonary edema" and "volume overload". Please assign both Type and Acuity to add specificity to reflect severity of illness and risk of mortality.  Possible Clinical Conditions? - Chronic Systolic Congestive Heart Failure - Chronic Diastolic Congestive Heart Failure - Chronic Systolic & Diastolic Congestive Heart Failure - Acute Systolic Congestive Heart Failure - Acute Diastolic Congestive Heart Failure - Acute Systolic & Diastolic Congestive Heart Failure - Acute on Chronic Systolic Congestive Heart Failure - Acute on Chronic Diastolic Congestive Heart Failure - Acute on Chronic Systolic & Diastolic Congestive Heart Failure - Other Condition   Supporting Information: - CKD stage IV - Accelerated HTN - Risk Factors: - Lasix 160mg  qd  Thank You, Beverley FiedlerLaurie E Hindy Perrault ,RN Clinical Documentation Specialist:  [phone]  [facility]- Health Information Management

## 2013-11-10 ENCOUNTER — Encounter (HOSPITAL_COMMUNITY)
Admission: EM | Disposition: A | Discharge status: Home / Self Care | Payer: Self-pay | Source: Home / Self Care | Attending: Internal Medicine

## 2013-11-10 ENCOUNTER — Other Ambulatory Visit: Payer: Self-pay | Admitting: *Deleted

## 2013-11-10 ENCOUNTER — Inpatient Hospital Stay (HOSPITAL_COMMUNITY): Payer: Self-pay | Admitting: Anesthesiology

## 2013-11-10 ENCOUNTER — Encounter (HOSPITAL_COMMUNITY): Payer: Self-pay | Admitting: Anesthesiology

## 2013-11-10 ENCOUNTER — Telehealth: Payer: Self-pay | Admitting: Vascular Surgery

## 2013-11-10 ENCOUNTER — Encounter (HOSPITAL_COMMUNITY): Payer: Self-pay | Admitting: Critical Care Medicine

## 2013-11-10 DIAGNOSIS — N183 Chronic kidney disease, stage 3 unspecified: Secondary | ICD-10-CM | POA: Diagnosis present

## 2013-11-10 DIAGNOSIS — Z4931 Encounter for adequacy testing for hemodialysis: Secondary | ICD-10-CM

## 2013-11-10 DIAGNOSIS — I10 Essential (primary) hypertension: Secondary | ICD-10-CM

## 2013-11-10 DIAGNOSIS — N189 Chronic kidney disease, unspecified: Secondary | ICD-10-CM

## 2013-11-10 DIAGNOSIS — N2889 Other specified disorders of kidney and ureter: Secondary | ICD-10-CM | POA: Diagnosis present

## 2013-11-10 DIAGNOSIS — N289 Disorder of kidney and ureter, unspecified: Secondary | ICD-10-CM

## 2013-11-10 DIAGNOSIS — I129 Hypertensive chronic kidney disease with stage 1 through stage 4 chronic kidney disease, or unspecified chronic kidney disease: Secondary | ICD-10-CM | POA: Diagnosis present

## 2013-11-10 DIAGNOSIS — N186 End stage renal disease: Secondary | ICD-10-CM

## 2013-11-10 HISTORY — PX: AV FISTULA PLACEMENT: SHX1204

## 2013-11-10 LAB — RENAL FUNCTION PANEL
ANION GAP: 17 — AB (ref 5–15)
Albumin: 3.2 g/dL — ABNORMAL LOW (ref 3.5–5.2)
BUN: 40 mg/dL — ABNORMAL HIGH (ref 6–23)
CO2: 22 mEq/L (ref 19–32)
Calcium: 8.9 mg/dL (ref 8.4–10.5)
Chloride: 102 mEq/L (ref 96–112)
Creatinine, Ser: 3.93 mg/dL — ABNORMAL HIGH (ref 0.50–1.35)
GFR, EST AFRICAN AMERICAN: 17 mL/min — AB (ref >90)
GFR, EST NON AFRICAN AMERICAN: 14 mL/min — AB (ref >90)
GLUCOSE: 165 mg/dL — AB (ref 70–99)
PHOSPHORUS: 4.1 mg/dL (ref 2.3–4.6)
POTASSIUM: 4.4 meq/L (ref 3.7–5.3)
Sodium: 141 mEq/L (ref 137–147)

## 2013-11-10 LAB — GLUCOSE, CAPILLARY
GLUCOSE-CAPILLARY: 142 mg/dL — AB (ref 70–99)
GLUCOSE-CAPILLARY: 220 mg/dL — AB (ref 70–99)
Glucose-Capillary: 156 mg/dL — ABNORMAL HIGH (ref 70–99)
Glucose-Capillary: 157 mg/dL — ABNORMAL HIGH (ref 70–99)
Glucose-Capillary: 192 mg/dL — ABNORMAL HIGH (ref 70–99)
Glucose-Capillary: 266 mg/dL — ABNORMAL HIGH (ref 70–99)

## 2013-11-10 LAB — CBC
HCT: 39.3 % (ref 39.0–52.0)
Hemoglobin: 12.8 g/dL — ABNORMAL LOW (ref 13.0–17.0)
MCH: 27.4 pg (ref 26.0–34.0)
MCHC: 32.6 g/dL (ref 30.0–36.0)
MCV: 84.2 fL (ref 78.0–100.0)
Platelets: 172 10*3/uL (ref 150–400)
RBC: 4.67 MIL/uL (ref 4.22–5.81)
RDW: 15.7 % — AB (ref 11.5–15.5)
WBC: 3.9 10*3/uL — ABNORMAL LOW (ref 4.0–10.5)

## 2013-11-10 LAB — SURGICAL PCR SCREEN
MRSA, PCR: NEGATIVE
Staphylococcus aureus: NEGATIVE

## 2013-11-10 SURGERY — ARTERIOVENOUS (AV) FISTULA CREATION
Anesthesia: Monitor Anesthesia Care | Site: Arm Lower | Laterality: Left

## 2013-11-10 MED ORDER — PHENYLEPHRINE 40 MCG/ML (10ML) SYRINGE FOR IV PUSH (FOR BLOOD PRESSURE SUPPORT)
PREFILLED_SYRINGE | INTRAVENOUS | Status: AC
  Filled 2013-11-10: qty 10

## 2013-11-10 MED ORDER — SODIUM CHLORIDE 0.9 % IR SOLN
Status: DC | PRN
  Administered 2013-11-10: 13:00:00

## 2013-11-10 MED ORDER — PHENYLEPHRINE HCL 10 MG/ML IJ SOLN
INTRAMUSCULAR | Status: DC | PRN
Start: 2013-11-10 — End: 2013-11-10
  Administered 2013-11-10 (×5): 80 ug via INTRAVENOUS

## 2013-11-10 MED ORDER — SODIUM CHLORIDE 0.9 % IV SOLN
INTRAVENOUS | Status: DC
  Administered 2013-11-10: 12:00:00 via INTRAVENOUS

## 2013-11-10 MED ORDER — HEPARIN SODIUM (PORCINE) 5000 UNIT/ML IJ SOLN
5000.0000 [IU] | Freq: Three times a day (TID) | INTRAMUSCULAR | Status: DC
  Administered 2013-11-10 – 2013-11-11 (×3): 5000 [IU] via SUBCUTANEOUS
  Filled 2013-11-10 (×3): qty 1

## 2013-11-10 MED ORDER — OXYCODONE HCL 5 MG/5ML PO SOLN: 5.0000 mg | Freq: Once | ORAL | Status: DC | PRN

## 2013-11-10 MED ORDER — ONDANSETRON HCL 4 MG/2ML IJ SOLN: 4.0000 mg | Freq: Once | INTRAMUSCULAR | Status: DC | PRN

## 2013-11-10 MED ORDER — HYDROMORPHONE HCL PF 1 MG/ML IJ SOLN: 0.2500 mg | INTRAMUSCULAR | Status: DC | PRN

## 2013-11-10 MED ORDER — PROPOFOL 10 MG/ML IV BOLUS
INTRAVENOUS | Status: AC
  Filled 2013-11-10: qty 20

## 2013-11-10 MED ORDER — INSULIN ASPART 100 UNIT/ML ~~LOC~~ SOLN
3.0000 [IU] | Freq: Once | SUBCUTANEOUS | Status: AC
  Administered 2013-11-10: 3 [IU] via SUBCUTANEOUS

## 2013-11-10 MED ORDER — OXYCODONE HCL 5 MG PO TABS: 5.0000 mg | ORAL_TABLET | Freq: Once | ORAL | Status: DC | PRN

## 2013-11-10 MED ORDER — SODIUM CHLORIDE 0.9 % IJ SOLN
INTRAMUSCULAR | Status: AC
  Filled 2013-11-10: qty 10

## 2013-11-10 MED ORDER — LIDOCAINE-EPINEPHRINE 0.5 %-1:200000 IJ SOLN
INTRAMUSCULAR | Status: AC
  Filled 2013-11-10: qty 1

## 2013-11-10 MED ORDER — MIDAZOLAM HCL 5 MG/5ML IJ SOLN
INTRAMUSCULAR | Status: DC | PRN
  Administered 2013-11-10: 2 mg via INTRAVENOUS

## 2013-11-10 MED ORDER — OXYCODONE HCL 5 MG PO TABS
5.0000 mg | ORAL_TABLET | Freq: Four times a day (QID) | ORAL | Status: DC | PRN
  Administered 2013-11-10: 5 mg via ORAL
  Filled 2013-11-10: qty 1

## 2013-11-10 MED ORDER — EPHEDRINE SULFATE 50 MG/ML IJ SOLN
INTRAMUSCULAR | Status: AC
  Filled 2013-11-10: qty 1

## 2013-11-10 MED ORDER — ONDANSETRON HCL 4 MG/2ML IJ SOLN
INTRAMUSCULAR | Status: DC | PRN
  Administered 2013-11-10: 4 mg via INTRAVENOUS

## 2013-11-10 MED ORDER — FENTANYL CITRATE 0.05 MG/ML IJ SOLN
INTRAMUSCULAR | Status: DC | PRN
  Administered 2013-11-10: 50 ug via INTRAVENOUS
  Administered 2013-11-10: 25 ug via INTRAVENOUS

## 2013-11-10 MED ORDER — PROPOFOL INFUSION 10 MG/ML OPTIME
INTRAVENOUS | Status: DC | PRN
  Administered 2013-11-10: 75 ug/kg/min via INTRAVENOUS

## 2013-11-10 MED ORDER — CARVEDILOL 25 MG PO TABS
25.0000 mg | ORAL_TABLET | Freq: Two times a day (BID) | ORAL | Status: DC
  Administered 2013-11-10 – 2013-11-11 (×2): 25 mg via ORAL
  Filled 2013-11-10 (×4): qty 1

## 2013-11-10 MED ORDER — MIDAZOLAM HCL 2 MG/2ML IJ SOLN
INTRAMUSCULAR | Status: AC
  Filled 2013-11-10: qty 2

## 2013-11-10 MED ORDER — MEPERIDINE HCL 25 MG/ML IJ SOLN: 6.2500 mg | INTRAMUSCULAR | Status: DC | PRN

## 2013-11-10 MED ORDER — 0.9 % SODIUM CHLORIDE (POUR BTL) OPTIME
TOPICAL | Status: DC | PRN
  Administered 2013-11-10: 1000 mL

## 2013-11-10 MED ORDER — LIDOCAINE-EPINEPHRINE 0.5 %-1:200000 IJ SOLN
INTRAMUSCULAR | Status: DC | PRN
  Administered 2013-11-10: 6 mL

## 2013-11-10 MED ORDER — ONDANSETRON HCL 4 MG/2ML IJ SOLN
INTRAMUSCULAR | Status: AC
  Filled 2013-11-10: qty 2

## 2013-11-10 MED ORDER — LIDOCAINE HCL (CARDIAC) 20 MG/ML IV SOLN
INTRAVENOUS | Status: AC
  Filled 2013-11-10: qty 5

## 2013-11-10 MED ORDER — DEXTROSE 5 % IV SOLN
1.5000 g | INTRAVENOUS | Status: DC | PRN
  Administered 2013-11-10: 1.5 g via INTRAVENOUS

## 2013-11-10 MED ORDER — FENTANYL CITRATE 0.05 MG/ML IJ SOLN
INTRAMUSCULAR | Status: AC
  Filled 2013-11-10: qty 5

## 2013-11-10 SURGICAL SUPPLY — 39 items
ARMBAND PINK RESTRICT EXTREMIT (MISCELLANEOUS) ×2 IMPLANT
BENZOIN TINCTURE PRP APPL 2/3 (GAUZE/BANDAGES/DRESSINGS) ×2 IMPLANT
BLADE 10 SAFETY STRL DISP (BLADE) IMPLANT
CANISTER SUCTION 2500CC (MISCELLANEOUS) ×2 IMPLANT
CLIP LIGATING EXTRA MED SLVR (CLIP) ×2 IMPLANT
CLIP LIGATING EXTRA SM BLUE (MISCELLANEOUS) ×2 IMPLANT
COVER PROBE W GEL 5X96 (DRAPES) ×2 IMPLANT
COVER SURGICAL LIGHT HANDLE (MISCELLANEOUS) ×2 IMPLANT
DECANTER SPIKE VIAL GLASS SM (MISCELLANEOUS) ×2 IMPLANT
ELECT REM PT RETURN 9FT ADLT (ELECTROSURGICAL) ×2
ELECTRODE REM PT RTRN 9FT ADLT (ELECTROSURGICAL) ×1 IMPLANT
GAUZE SPONGE 4X4 12PLY STRL (GAUZE/BANDAGES/DRESSINGS) ×2 IMPLANT
GEL ULTRASOUND 20GR AQUASONIC (MISCELLANEOUS) IMPLANT
GLOVE BIO SURGEON STRL SZ 6.5 (GLOVE) ×8 IMPLANT
GLOVE BIOGEL PI IND STRL 6.5 (GLOVE) ×3 IMPLANT
GLOVE BIOGEL PI IND STRL 7.0 (GLOVE) ×1 IMPLANT
GLOVE BIOGEL PI INDICATOR 6.5 (GLOVE) ×3
GLOVE BIOGEL PI INDICATOR 7.0 (GLOVE) ×1
GLOVE SS BIOGEL STRL SZ 7.5 (GLOVE) ×1 IMPLANT
GLOVE SUPERSENSE BIOGEL SZ 7.5 (GLOVE) ×1
GLOVE SURG SS PI 6.0 STRL IVOR (GLOVE) ×2 IMPLANT
GOWN STRL REUS W/ TWL LRG LVL3 (GOWN DISPOSABLE) ×4 IMPLANT
GOWN STRL REUS W/TWL LRG LVL3 (GOWN DISPOSABLE) ×4
KIT BASIN OR (CUSTOM PROCEDURE TRAY) ×2 IMPLANT
KIT ROOM TURNOVER OR (KITS) ×2 IMPLANT
NS IRRIG 1000ML POUR BTL (IV SOLUTION) ×2 IMPLANT
PACK CV ACCESS (CUSTOM PROCEDURE TRAY) ×2 IMPLANT
PAD ARMBOARD 7.5X6 YLW CONV (MISCELLANEOUS) ×4 IMPLANT
SPONGE GAUZE 4X4 12PLY STER LF (GAUZE/BANDAGES/DRESSINGS) ×2 IMPLANT
STRIP CLOSURE SKIN 1/2X4 (GAUZE/BANDAGES/DRESSINGS) ×2 IMPLANT
SUT PROLENE 6 0 CC (SUTURE) ×4 IMPLANT
SUT VIC AB 3-0 SH 27 (SUTURE) ×1
SUT VIC AB 3-0 SH 27X BRD (SUTURE) ×1 IMPLANT
SUT VICRYL 4-0 PS2 18IN ABS (SUTURE) ×2 IMPLANT
TAPE CLOTH SURG 4X10 WHT LF (GAUZE/BANDAGES/DRESSINGS) ×2 IMPLANT
TOWEL OR 17X24 6PK STRL BLUE (TOWEL DISPOSABLE) ×2 IMPLANT
TOWEL OR 17X26 10 PK STRL BLUE (TOWEL DISPOSABLE) ×2 IMPLANT
UNDERPAD 30X30 INCONTINENT (UNDERPADS AND DIAPERS) ×2 IMPLANT
WATER STERILE IRR 1000ML POUR (IV SOLUTION) ×2 IMPLANT

## 2013-11-10 NOTE — Discharge Instructions (Signed)
° ° °  11/10/2013 Michael BookmanJohn Anane-Brobbey [id number] December 03, 1945  Surgeon(s): Larina Earthlyodd F Early, MD  Procedure(s): LEFT ARM  ARTERIOVENOUS (AV) FISTULA CREATION  x Do not stick graft for 12 weeks

## 2013-11-10 NOTE — Anesthesia Procedure Notes (Signed)
Procedure Name: MAC Date/Time: 11/10/2013 12:00 PM Performed by: Elon AlasLEE, Martese Vanatta BROWN Pre-anesthesia Checklist: Patient identified, Emergency Drugs available, Suction available, Patient being monitored and Timeout performed Patient Re-evaluated:Patient Re-evaluated prior to inductionOxygen Delivery Method: Nasal cannula Intubation Type: IV induction Placement Confirmation: positive ETCO2 and breath sounds checked- equal and bilateral Dental Injury: Teeth and Oropharynx as per pre-operative assessment

## 2013-11-10 NOTE — Progress Notes (Signed)
Patient is back from vascular. No issues noted.

## 2013-11-10 NOTE — Progress Notes (Signed)
Patient left for vascular procedure.  Called report to extension [phone].family went down with patient.

## 2013-11-10 NOTE — Progress Notes (Signed)
Subjective: Interval History: has no complaint .  Objective: Vital signs in last 24 hours: Temp:  [98.5 F (36.9 C)-98.6 F (37 C)] 98.5 F (36.9 C) (08/12 0634) Pulse Rate:  [78-90] 79 (08/12 0634) Resp:  [16-18] 17 (08/12 0634) BP: (153-191)/(74-98) 188/93 mmHg (08/12 0634) SpO2:  [98 %-100 %] 98 % (08/12 0634) Weight:  [75.887 kg (167 lb 4.8 oz)] 75.887 kg (167 lb 4.8 oz) (08/12 0634) Weight change: -2.613 kg (-5 lb 12.2 oz)  Intake/Output from previous day: 08/11 0701 - 08/12 0700 In: 1100 [P.O.:1100] Out: 2150 [Urine:2150] Intake/Output this shift:    General appearance: alert, cooperative and no distress Resp: clear to auscultation bilaterally Cardio: S1, S2 normal, S4 present and systolic murmur: holosystolic 2/6, blowing at apex GI: pos bs, soft,liver down 5 cm Extremities: edema 1 +  Lab Results:  Recent Labs  11/10/13 0601  WBC 3.9*  HGB 12.8*  HCT 39.3  PLT 172   BMET:  Recent Labs  11/09/13 0511 11/10/13 0601  NA 145 141  K 3.6* 4.4  CL 105 102  CO2 23 22  GLUCOSE 118* 165*  BUN 40* 40*  CREATININE 3.68* 3.93*  CALCIUM 8.9 8.9   No results found for this basename: PTH,  in the last 72 hours Iron Studies: No results found for this basename: IRON, TIBC, TRANSFERRIN, FERRITIN,  in the last 72 hours  Studies/Results: Dg Chest 2 View  11/08/2013   CLINICAL DATA:  Pleural effusion.  EXAM: CHEST  2 VIEW  COMPARISON:  11/06/2013  FINDINGS: Low lung volumes. Mildly enlarged cardiopericardial silhouette. Mild tortuosity of the thoracic aorta. Mild prominence of the central pulmonary vasculature with cephalization of blood flow.  Reduced perihilar and basilar airspace opacities. Mild blunting of both posterior costophrenic angles.  Thoracic spondylosis.  IMPRESSION: 1. Cardiomegaly with pulmonary venous hypertension but improved edema. 2. Small bilateral pleural effusions. 3. Hilar prominence probably due to vasculature, adenopathy considered less likely.  Low lung volumes contribute to this appearance.   Electronically Signed   By: Herbie BaltimoreWalt  Liebkemann M.D.   On: 11/08/2013 16:48    I have reviewed the patient's current medications.  Assessment/Plan: 1 CKD 4 to get access today.  Vol improved. Cont Lasix 2 Anemia not an issue 3 HTPH on meds 4 DM controlled 5 Renal Mass per Urology 6 Social situation for SW intervention 7 HTN not controlled, ^ Coreg P Access, ^ coreg, can d/c [place] [facility], and to see Dr. Hyman HopesWebb .     LOS: 4 days   Kamile Fassler L 11/10/2013,9:43 AM

## 2013-11-10 NOTE — H&P (View-Only) (Signed)
        Spoke with patient and informed him of the plan to perform the creation of a left arm AV fistula and placement of Diatek catheter tomorrow.  His Daughter was present and they agree to go ahead with the surgery.  Amarylis Rovito MAUREEN PA-C

## 2013-11-10 NOTE — Op Note (Signed)
    OPERATIVE REPORT  DATE OF SURGERY: 11/10/2013  PATIENT: Michael Dorsey, 68 y.o. male MRN: [id number]  DOB: 01/26/1946  PRE-OPERATIVE DIAGNOSIS: Chronic renal insufficiency  POST-OPERATIVE DIAGNOSIS:  Same  PROCEDURE: Left upper arm AV fistula creation  SURGEON:  Gretta Beganodd Liz Pinho, M.D.  PHYSICIAN ASSISTANT: Rhyne  ANESTHESIA:  Local with sedation  EBL: Minimal ml  Total I/O In: 440 [P.O.:240; I.V.:200] Out: 475 [Urine:450; Blood:25]  BLOOD ADMINISTERED: None  DRAINS: None  SPECIMEN: None  COUNTS CORRECT:  YES  PLAN OF CARE: PACU   PATIENT DISPOSITION:  PACU - hemodynamically stable  PROCEDURE DETAILS: Patient was taken to the upper and placed supine position where the area of the left arm prepped in usual fashion. SonoSite L. percent was used to visualize the vein which was of good caliber at the antecubital space. The brachial artery was exposed through the same incision was of good caliber. The vein was mobilized and was ligated distally and divided. The vein was brought into approximation with the brachial artery. The brachial artery was occluded proximally and distally was opened with an 11 blade and sent longitudinally Potts scissors. The vein was cut to appropriate length and spatulated and sewn end-to-side to the artery with a running 6-0 Prolene suture. Clamps removed and excellent thrill was noted in the vein. The wound irrigated with saline. Hemostasis obtained electrocautery. The wounds were closed with 3-0 Vicryl in the subcutaneous and subcuticular tissue. The stricture for applied the patient was taken to the recovery in stable condition   Gretta Beganodd Hedy Garro, M.D. 11/10/2013 4:12 PM

## 2013-11-10 NOTE — Progress Notes (Signed)
Inpatient Diabetes Program Recommendations  AACE/ADA: New Consensus Statement on Inpatient Glycemic Control (2013)  Target Ranges:  Prepandial:   less than 140 mg/dL      Peak postprandial:   less than 180 mg/dL (1-2 hours)      Critically ill patients:  140 - 180 mg/dL   Reason for Assessment:  Results for Michael BookmanNANE-BROBBEY, Orlander (MRN [id number]) as of 11/10/2013 12:49  Ref. Range 11/09/2013 11:00 11/09/2013 15:59 11/09/2013 22:32 11/10/2013 05:59 11/10/2013 11:19  Glucose-Capillary Latest Range: 70-99 mg/dL 409242 (H) 811206 (H) 914252 (H) 156 (H) 157 (H)   Diabetes history: Type 2 Outpatient Diabetes medications: Novolog 70/30 15 units bid Current orders for Inpatient glycemic control: Novolog sensitive tid with meals  Once patient is eating, may consider restarting a portion of patient's home dose of 70/30.  Consider NPH 6 units bid.  Thanks,  Beryl MeagerJenny Beretta Ginsberg, RN, BC-ADM Inpatient Diabetes Coordinator Pager [phone]

## 2013-11-10 NOTE — Anesthesia Preprocedure Evaluation (Addendum)
Anesthesia Evaluation  Patient identified by MRN, date of birth, ID band Patient awake and Patient confused    Reviewed: Allergy & Precautions, H&P , NPO status , Patient's Chart, lab work & pertinent test results, reviewed documented beta blocker date and time   Airway Mallampati: I TM Distance: >3 FB Neck ROM: Full    Dental  (+) Dental Advisory Given, Teeth Intact   Pulmonary          Cardiovascular hypertension, Pt. on home beta blockers and Pt. on medications     Neuro/Psych    GI/Hepatic   Endo/Other  diabetes, Oral Hypoglycemic Agents  Renal/GU ARFRenal disease     Musculoskeletal   Abdominal   Peds  Hematology   Anesthesia Other Findings   Reproductive/Obstetrics                         Anesthesia Physical Anesthesia Plan  ASA: III  Anesthesia Plan: MAC   Post-op Pain Management:    Induction: Intravenous  Airway Management Planned: Natural Airway and Nasal Cannula  Additional Equipment:   Intra-op Plan:   Post-operative Plan:   Informed Consent: I have reviewed the patients History and Physical, chart, labs and discussed the procedure including the risks, benefits and alternatives for the proposed anesthesia with the patient or authorized representative who has indicated his/her understanding and acceptance.     Plan Discussed with: CRNA and Surgeon  Anesthesia Plan Comments:        Anesthesia Quick Evaluation

## 2013-11-10 NOTE — Progress Notes (Signed)
[facility] PROGRESS NOTE  Michael BookmanJohn Dorsey ZOX:[id number] DOB: 1945-08-21 DOA: 11/06/2013 PCP: Jackie PlumSEI-BONSU,GEORGE, MD  Assessment/Plan: 68 year old from [place], with PMH of CKD, ? Renal mass following up with urology, who presents with dyspnea, respiratory failure, volume overload.   1-Acute Respiratory Failure; Improving. Related to pulmonary edema secondary to renal failure. Also component of Heart failure. Continue with lasix, ECHO showed diastolic dysfunction. Chest x ray with improved edema, and pleural effusion.   2-Acute on chronic diastolic Heart Failure: improving on lasix.   3-CKD, stage IV: Continue with lasix. Nephrologist following. Holding ACE. Plan for permanent AV access 8/12.  4-? Renal Mass; needs to follow up with urology. Concern that after surgery he will required dialysis. Daughter asked if the surgery can be done while he is here. I told them that this will not be possible as the renal mass is not an urgent issue. Ideally he will need better control of his BP prior to undergoing surgery.   5-Diabetes; Just on SSI. Holding 70/30 due to low CBG's. Will resume at discharge.  6-Hypertension; continue with Norvasc, Coreg, lasix, hydralazine and Imdur. Patient runs out of BP meds before ordering refills. As a result he is out of his medications for about 5 days. I have told him to order refills prior to running out of home medications.   7-Bilateral LE edema,worse on the right:  Dopplers negative for DVT.   DVT Prophylaxis: Heparin Code Status: Full Code.  Family Communication: Care discussed with patient and his daughter in great detail Disposition Plan: Possible DC 8/13.   Consultants:  Nephrology   Vascular Surgery  Procedures: Doppler LE:  Negative for DVT  2D-ECHO: Normal LV function; moderate LVH; grade 2 diastolic dysfunction; severe LAE; mild RAE; mild MR; mild to moderately elevated pulmonary pressures; small pericardial effusion; probable  livercyst.  Antibiotics:  none  HPI/Subjective: He denies complaints. No chest pain or shortness of breath.    Objective: Filed Vitals:   11/10/13 1014  BP: 203/94  Pulse: 77  Temp: 98.5 F (36.9 C)  Resp: 18    Intake/Output Summary (Last 24 hours) at 11/10/13 1034 Last data filed at 11/10/13 0749  Gross per 24 hour  Intake    780 ml  Output   2150 ml  Net  -1370 ml   Filed Weights   11/08/13 0556 11/09/13 0640 11/10/13 0634  Weight: 80.786 kg (178 lb 1.6 oz) 78.5 kg (173 lb 1 oz) 75.887 kg (167 lb 4.8 oz)    Exam:   General:  No distress.   Cardiovascular: S1, S2 RRR. No edema.  Respiratory: Improved air entry at bases. No wheezing.  Abdomen: BS present, soft, NT  Data Reviewed: Basic Metabolic Panel:  Recent Labs Lab 11/06/13 2000 11/07/13 0430 11/07/13 0438 11/08/13 0354 11/09/13 0511 11/10/13 0601  NA 144 144  --  145 145 141  K 3.7 3.4*  --  3.4* 3.6* 4.4  CL 106 106  --  106 105 102  CO2 20 22  --  27 23 22   GLUCOSE 83 168*  --  103* 118* 165*  BUN 52* 49*  --  46* 40* 40*  CREATININE 3.71* 3.64*  --  3.74* 3.68* 3.93*  CALCIUM 8.7 8.5  --  8.3* 8.9 8.9  PHOS  --   --  4.1 4.3 3.8 4.1   Liver Function Tests:  Recent Labs Lab 11/06/13 2000 11/08/13 0354 11/09/13 0511 11/10/13 0601  AST 59*  --  26  --  ALT 136*  --  68*  --   ALKPHOS 128*  --  105  --   BILITOT 0.2*  --  0.3  --   PROT 7.5  --  7.4  --   ALBUMIN 3.6 3.1* 3.5 3.2*   CBC:  Recent Labs Lab 11/06/13 1748 11/06/13 2000 11/07/13 0430 11/10/13 0601  WBC  --  6.3 4.7 3.9*  NEUTROABS  --  4.3  --   --   HGB 13.9 12.7* 11.4* 12.8*  HCT 41.0 38.7* 34.6* 39.3  MCV  --  81.6 81.4 84.2  PLT  --  204 187 172   Cardiac Enzymes:  Recent Labs Lab 11/06/13 1938  TROPONINI <0.30   BNP (last 3 results)  Recent Labs  11/06/13 1938  PROBNP 1815.0*   CBG:  Recent Labs Lab 11/09/13 0559 11/09/13 1100 11/09/13 1559 11/09/13 2232 11/10/13 0559  GLUCAP  100* 242* 206* 252* 156*    Recent Results (from the past 240 hour(s))  SURGICAL PCR SCREEN     Status: None   Collection Time    11/10/13  5:47 AM      Result Value Ref Range Status   MRSA, PCR NEGATIVE  NEGATIVE Final   Staphylococcus aureus NEGATIVE  NEGATIVE Final   Comment:            The Xpert SA Assay (FDA     approved for NASAL specimens     in patients over 83 years of age),     is one component of     a comprehensive surveillance     program.  Test performance has     been validated by [employer] for patients greater     than or equal to 41 year old.     It is not intended     to diagnose infection nor to     guide or monitor treatment.     Studies: Dg Chest 2 View  11/08/2013   CLINICAL DATA:  Pleural effusion.  EXAM: CHEST  2 VIEW  COMPARISON:  11/06/2013  FINDINGS: Low lung volumes. Mildly enlarged cardiopericardial silhouette. Mild tortuosity of the thoracic aorta. Mild prominence of the central pulmonary vasculature with cephalization of blood flow.  Reduced perihilar and basilar airspace opacities. Mild blunting of both posterior costophrenic angles.  Thoracic spondylosis.  IMPRESSION: 1. Cardiomegaly with pulmonary venous hypertension but improved edema. 2. Small bilateral pleural effusions. 3. Hilar prominence probably due to vasculature, adenopathy considered less likely. Low lung volumes contribute to this appearance.   Electronically Signed   By: Herbie Baltimore M.D.   On: 11/08/2013 16:48    Scheduled Meds: . amLODipine  10 mg Oral Daily  . calcitRIOL  0.25 mcg Oral Daily  . carvedilol  25 mg Oral BID WC  . cefUROXime (ZINACEF)  IV  1.5 g Intravenous On Call to OR  . docusate sodium  100 mg Oral BID  . furosemide  160 mg Oral BID  . heparin  5,000 Units Subcutaneous 3 times per day  . hydrALAZINE  50 mg Oral 3 times per day  . insulin aspart  0-9 Units Subcutaneous TID WC  . isosorbide mononitrate  30 mg Oral Daily  . polyethylene glycol  17 g Oral  BID  . potassium chloride  40 mEq Oral BID  . senna  1 tablet Oral BID  . sodium chloride  3 mL Intravenous Q12H   Continuous Infusions:   Active Problems:  Hypertensive urgency   Pulmonary edema   Pleural effusion, bilateral   Acute renal failure superimposed on stage 3 chronic kidney disease   DM II (diabetes mellitus, type II), controlled   Hyperlipidemia   Accelerated hypertension    Time spent: 30 minutes.     [facility]  [facility] Pager [phone].   If 7PM-7AM, please contact night-coverage at [email or web address], password [facility] 11/10/2013, 10:34 AM  LOS: 4 days

## 2013-11-10 NOTE — Progress Notes (Signed)
Family had questions about using his orange card. Case management consult has been ordered.

## 2013-11-10 NOTE — Care Management Note (Addendum)
    Page 1 of 1   11/11/2013     2:42:10 PM CARE MANAGEMENT NOTE 11/11/2013  Patient:  Michael Dorsey,Michael Dorsey   Account Number:  [id number]  Date Initiated:  11/10/2013  Documentation initiated by:  [facility],Meryle Pugmire  Subjective/Objective Assessment:   68 y.o. male w hx of HTN, DM, renal disorder who presents with gradual onset shortness of breath for the last 2 weeks and worsening edema in his lower extremities over the last month. //Home with family.     Action/Plan:   IV diureses, surgical vascular access for inevitability of dialysis.//[facility]   Anticipated DC Date:  11/11/2013   Anticipated DC Plan:  HOME W HOME HEALTH SERVICES  In-house referral  Clinical Social Worker  Artistinancial Counselor      DC Planning Services  CM consult      Choice offered to / List presented to:             Status of service:  Completed, signed off Medicare Important Message given?   (If response is "NO", the following Medicare IM given date fields will be blank) Date Medicare IM given:   Medicare IM given by:   Date Additional Medicare IM given:   Additional Medicare IM given by:    Discharge Disposition:  HOME/SELF CARE  Per UR Regulation:  Reviewed for med. necessity/level of care/duration of stay  If discussed at Long Length of Stay Meetings, dates discussed:    Comments:

## 2013-11-10 NOTE — Interval H&P Note (Signed)
History and Physical Interval Note:  11/10/2013 11:36 AM  Michael BookmanJohn Anane-Brobbey  has presented today for surgery, with the diagnosis of End stage renal disease   The various methods of treatment have been discussed with the patient and family. After consideration of risks, benefits and other options for treatment, the patient has consented to  Procedure(s): ARTERIOVENOUS (AV) FISTULA CREATION (Left) as a surgical intervention .  The patient's history has been reviewed, patient examined, no change in status, stable for surgery.  I have reviewed the patient's chart and labs.  Questions were answered to the patient's satisfaction.     Anubis Fundora

## 2013-11-10 NOTE — Transfer of Care (Signed)
Immediate Anesthesia Transfer of Care Note  Patient: Michael Dorsey  Procedure(s) Performed: Procedure(s): LEFT ARM  ARTERIOVENOUS (AV) FISTULA CREATION (Left)  Patient Location: PACU  Anesthesia Type:MAC  Level of Consciousness: awake, alert  and oriented  Airway & Oxygen Therapy: Patient Spontanous Breathing  Post-op Assessment: Report given to PACU RN and Post -op Vital signs reviewed and stable  Post vital signs: Reviewed and stable  Complications: No apparent anesthesia complications

## 2013-11-10 NOTE — Anesthesia Postprocedure Evaluation (Signed)
Anesthesia Post Note  Patient: Michael Dorsey  Procedure(s) Performed: Procedure(s) (LRB): LEFT ARM  ARTERIOVENOUS (AV) FISTULA CREATION (Left)  Anesthesia type: general  Patient location: PACU  Post pain: Pain level controlled  Post assessment: Patient's Cardiovascular Status Stable  Last Vitals:  Filed Vitals:   11/10/13 1440  BP: 175/90  Pulse:   Temp:   Resp: 18    Post vital signs: Reviewed and stable  Level of consciousness: sedated  Complications: No apparent anesthesia complications

## 2013-11-10 NOTE — Telephone Encounter (Addendum)
Message copied by Rosalyn ChartersOUX, BONNIE A on Wed Nov 10, 2013  5:11 PM ------      Message from: [place], [place]      Created: Wed Nov 10, 2013  1:13 PM      Regarding: schedule                   ----- Message -----         From: Dara LordsSamantha J Rhyne, PA-C         Sent: 11/10/2013   1:08 PM           To: [facility] Charge Pool            S/p left brachiocephalic AVF 11/10/13.  F/u with Dr. Arbie CookeyEarly in 4-6 weeks for f/u with duplex.             Thanks,      Lelon MastSamantha ------  notifed patient of post op appt. with dr. early on 12-14-13 9:30

## 2013-11-11 ENCOUNTER — Encounter (HOSPITAL_COMMUNITY): Payer: Self-pay | Admitting: Vascular Surgery

## 2013-11-11 LAB — RENAL FUNCTION PANEL
ALBUMIN: 3.1 g/dL — AB (ref 3.5–5.2)
ANION GAP: 16 — AB (ref 5–15)
BUN: 40 mg/dL — ABNORMAL HIGH (ref 6–23)
CO2: 23 mEq/L (ref 19–32)
Calcium: 9 mg/dL (ref 8.4–10.5)
Chloride: 104 mEq/L (ref 96–112)
Creatinine, Ser: 3.96 mg/dL — ABNORMAL HIGH (ref 0.50–1.35)
GFR calc non Af Amer: 14 mL/min — ABNORMAL LOW (ref >90)
GFR, EST AFRICAN AMERICAN: 16 mL/min — AB (ref >90)
Glucose, Bld: 123 mg/dL — ABNORMAL HIGH (ref 70–99)
PHOSPHORUS: 4.7 mg/dL — AB (ref 2.3–4.6)
POTASSIUM: 4.5 meq/L (ref 3.7–5.3)
SODIUM: 143 meq/L (ref 137–147)

## 2013-11-11 LAB — CBC
HCT: 41 % (ref 39.0–52.0)
HEMOGLOBIN: 13 g/dL (ref 13.0–17.0)
MCH: 26.9 pg (ref 26.0–34.0)
MCHC: 31.7 g/dL (ref 30.0–36.0)
MCV: 84.9 fL (ref 78.0–100.0)
Platelets: 170 10*3/uL (ref 150–400)
RBC: 4.83 MIL/uL (ref 4.22–5.81)
RDW: 15.7 % — AB (ref 11.5–15.5)
WBC: 4.9 10*3/uL (ref 4.0–10.5)

## 2013-11-11 LAB — GLUCOSE, CAPILLARY
GLUCOSE-CAPILLARY: 259 mg/dL — AB (ref 70–99)
Glucose-Capillary: 181 mg/dL — ABNORMAL HIGH (ref 70–99)

## 2013-11-11 MED ORDER — POLYETHYLENE GLYCOL 3350 17 G PO PACK: 17.0000 g | PACK | Freq: Two times a day (BID) | ORAL | Status: AC

## 2013-11-11 MED ORDER — FUROSEMIDE 80 MG PO TABS: 160.0000 mg | ORAL_TABLET | Freq: Two times a day (BID) | ORAL | Status: DC

## 2013-11-11 MED ORDER — ROSUVASTATIN CALCIUM 10 MG PO TABS
10.0000 mg | ORAL_TABLET | Freq: Two times a day (BID) | ORAL | Status: DC
Start: 2013-11-11 — End: 2014-07-29

## 2013-11-11 MED ORDER — POTASSIUM CHLORIDE CRYS ER 20 MEQ PO TBCR: 40.0000 meq | EXTENDED_RELEASE_TABLET | Freq: Two times a day (BID) | ORAL | Status: DC

## 2013-11-11 MED ORDER — AMLODIPINE BESYLATE 10 MG PO TABS: 10.0000 mg | ORAL_TABLET | Freq: Every day | ORAL | Status: DC

## 2013-11-11 MED ORDER — CARVEDILOL 25 MG PO TABS: 25.0000 mg | ORAL_TABLET | Freq: Two times a day (BID) | ORAL | Status: DC

## 2013-11-11 MED ORDER — HYDRALAZINE HCL 50 MG PO TABS
50.0000 mg | ORAL_TABLET | Freq: Three times a day (TID) | ORAL | Status: DC
Start: 2013-11-11 — End: 2014-05-17

## 2013-11-11 MED ORDER — CALCITRIOL 0.25 MCG PO CAPS: 0.2500 ug | ORAL_CAPSULE | Freq: Every day | ORAL | Status: DC

## 2013-11-11 MED ORDER — ISOSORBIDE MONONITRATE ER 30 MG PO TB24: 30.0000 mg | ORAL_TABLET | Freq: Every day | ORAL | Status: DC

## 2013-11-11 NOTE — Progress Notes (Signed)
Patient ID: Michael BookmanJohn Anane-Brobbey, male   DOB: 1946/02/17, 68 y.o.   MRN: [id number] Comfortable this morning. Minimal pain associated with left upper arm AV fistula. Dressing removed and wound healing nicely. Steri-Strips intact. Excellent Hennessy Bartel vein maturation and excellent thrill. Will not follow actively while in the hospital. We'll see in the office in one month for followup

## 2013-11-11 NOTE — Plan of Care (Signed)
Problem: Phase I Progression Outcomes Goal: EF % per last Echo/documented,Core Reminder form on chart Outcome: Completed/Met Date Met:  11/11/13 EF 55-60%

## 2013-11-11 NOTE — Progress Notes (Signed)
Subjective: Interval History: has no complaint .  Objective: Vital signs in last 24 hours: Temp:  [97.1 F (36.2 C)-99.1 F (37.3 C)] 99.1 F (37.3 C) (08/13 0531) Pulse Rate:  [70-86] 82 (08/13 0531) Resp:  [12-19] 18 (08/13 0531) BP: (143-203)/(81-101) 179/81 mmHg (08/13 0531) SpO2:  [97 %-100 %] 100 % (08/13 0531) Weight:  [75.1 kg (165 lb 9.1 oz)] 75.1 kg (165 lb 9.1 oz) (08/13 0531) Weight change: -0.787 kg (-1 lb 11.8 oz)  Intake/Output from previous day: 08/12 0701 - 08/13 0700 In: 1040 [P.O.:840; I.V.:200] Out: 1575 [Urine:1550; Blood:25] Intake/Output this shift: Total I/O In: -  Out: 200 [Urine:200]  General appearance: alert, cooperative and no distress Back: negative Cardio: S1, S2 normal and systolic murmur: holosystolic 2/6, blowing at apex GI: soft, non-tender; bowel sounds normal; no masses,  no organomegaly Extremities: AVF LUA, Tr edema Resp no R,R or W Lab Results:  Recent Labs  11/10/13 0601 11/11/13 0535  WBC 3.9* 4.9  HGB 12.8* 13.0  HCT 39.3 41.0  PLT 172 170   BMET:  Recent Labs  11/10/13 0601 11/11/13 0535  NA 141 143  K 4.4 4.5  CL 102 104  CO2 22 23  GLUCOSE 165* 123*  BUN 40* 40*  CREATININE 3.93* 3.96*  CALCIUM 8.9 9.0   No results found for this basename: PTH,  in the last 72 hours Iron Studies: No results found for this basename: IRON, TIBC, TRANSFERRIN, FERRITIN,  in the last 72 hours  Studies/Results: No results found.  I have reviewed the patient's current medications.  Assessment/Plan: 1  CKD4/5  AVF ok., vol improving. Ok to D/c home and f/u Dr. Hyman HopesWebb , have setup 2 Renal Mass will make sure f/u with Dr. Vernie Ammonsttelin soon 3 DM controlle 4 HTN see what does and modify meds outpatient 5 ^ lipids 6 HPTH on vit D P cont meds, ok to D/C , F/u Dr. Lesleigh NoeWebb/Ottelin. Will S/o    LOS: 5 days   Malachy Coleman L 11/11/2013,9:12 AM

## 2013-11-11 NOTE — Progress Notes (Signed)
Patient discharged home. Discharge instructions given to patient and daughter.

## 2013-11-11 NOTE — Progress Notes (Addendum)
The patient's BP was 190/83.  He was given 10 mg of IV Hydralazine.

## 2013-11-11 NOTE — Discharge Summary (Signed)
[facility]  Physician Discharge Summary   Patient ID: Michael Dorsey MRN: [id number] DOB/AGE: 1945/11/02 69 y.o.  Admit date: 11/06/2013 Discharge date: 11/11/2013  PCP: Jackie Plum, MD  DISCHARGE DIAGNOSES:  Active Problems:   Pleural effusion, bilateral   Acute renal failure superimposed on stage 3 chronic kidney disease   DM II (diabetes mellitus, type II), controlled   Hyperlipidemia   Accelerated hypertension   Renal mass   CKD (chronic kidney disease) stage 3, GFR 30-59 ml/min   Hypertension, renal disease   RECOMMENDATIONS FOR OUTPATIENT FOLLOW UP: 1. Needs to follow up with Urology for renal mass 2. Needs follow up with Nephrology for CKD  DISCHARGE CONDITION: fair  Diet recommendation: Mod Carb/Renal  Filed Weights   11/09/13 0640 11/10/13 0634 11/11/13 0531  Weight: 78.5 kg (173 lb 1 oz) 75.887 kg (167 lb 4.8 oz) 75.1 kg (165 lb 9.1 oz)    INITIAL HISTORY: 68 year old from [place], with PMH of CKD, ? Renal mass following up with urology, who presents with dyspnea, respiratory failure, volume overload.   Consultants:  Nephrology  Vascular Surgery  Procedures:  Doppler LE:  Negative for DVT   2D-ECHO:  Normal LV function; moderate LVH; grade 2 diastolic dysfunction; severe LAE; mild RAE; mild MR; mild to moderately elevated pulmonary pressures; small pericardial effusion; probable livercyst.  LUE AV Fistula by Dr. Arbie Cookey on 8/12  HOSPITAL COURSE:   Acute Respiratory Failure This was due to pulmonary edema secondary to renal failure. There was also a component of diastolic Heart failure. He was given lasix with improvement.   Acute on chronic diastolic Heart Failure Improving on lasix. Lasix will be continued at home.  CKD, stage IV Nephrology was consulted. It is felt that he may require dialysis once his kidney is removed. So an AV fistula was placed by vascular surgery. Not to be used for at least 12 weeks. Nephrology to follow as  OP.   Renal Mass Patient apparently has a renal mass and follows with Urology. He will likely need to undergo surgery in near future. Patient to keep up with Op appointments. Hopefully his BP will be better controlled by the time he undergoes surgery.  Diabetes Mellitus Type 2 with renal Complications May resume home regimen.   Accelerated Hypertension Bp has been poorly controlled. Medications changes were made. Lasix was added. Hydralazine and Coreg dose was increased. Patient apparently runs out of BP meds before ordering refills. As a result he is out of his medications for about 5 days. I have told him to order refills prior to running out of home medications.   Bilateral LE edema,worse on the right Dopplers negative for DVT.   Patient is improved and remains stable. He is ok for discharge. Discussed with Dr. Darrick Penna. Attempted to call daughter with no response.  PERTINENT LABS:  The results of significant diagnostics from this hospitalization (including imaging, microbiology, ancillary and laboratory) are listed below for reference.    Microbiology: Recent Results (from the past 240 hour(s))  SURGICAL PCR SCREEN     Status: None   Collection Time    11/10/13  5:47 AM      Result Value Ref Range Status   MRSA, PCR NEGATIVE  NEGATIVE Final   Staphylococcus aureus NEGATIVE  NEGATIVE Final   Comment:            The Xpert SA Assay (FDA     approved for NASAL specimens     in patients over 21 years  of age),     is one component of     a comprehensive surveillance     program.  Test performance has     been validated by [employer] for patients greater     than or equal to 64 year old.     It is not intended     to diagnose infection nor to     guide or monitor treatment.     Labs: Basic Metabolic Panel:  Recent Labs Lab 11/07/13 0430 11/07/13 0438 11/08/13 0354 11/09/13 0511 11/10/13 0601 11/11/13 0535  NA 144  --  145 145 141 143  K 3.4*  --  3.4* 3.6*  4.4 4.5  CL 106  --  106 105 102 104  CO2 22  --  27 23 22 23   GLUCOSE 168*  --  103* 118* 165* 123*  BUN 49*  --  46* 40* 40* 40*  CREATININE 3.64*  --  3.74* 3.68* 3.93* 3.96*  CALCIUM 8.5  --  8.3* 8.9 8.9 9.0  PHOS  --  4.1 4.3 3.8 4.1 4.7*   Liver Function Tests:  Recent Labs Lab 11/06/13 2000 11/08/13 0354 11/09/13 0511 11/10/13 0601 11/11/13 0535  AST 59*  --  26  --   --   ALT 136*  --  68*  --   --   ALKPHOS 128*  --  105  --   --   BILITOT 0.2*  --  0.3  --   --   PROT 7.5  --  7.4  --   --   ALBUMIN 3.6 3.1* 3.5 3.2* 3.1*   CBC:  Recent Labs Lab 11/06/13 1748 11/06/13 2000 11/07/13 0430 11/10/13 0601 11/11/13 0535  WBC  --  6.3 4.7 3.9* 4.9  NEUTROABS  --  4.3  --   --   --   HGB 13.9 12.7* 11.4* 12.8* 13.0  HCT 41.0 38.7* 34.6* 39.3 41.0  MCV  --  81.6 81.4 84.2 84.9  PLT  --  204 187 172 170   Cardiac Enzymes:  Recent Labs Lab 11/06/13 1938  TROPONINI <0.30   BNP: BNP (last 3 results)  Recent Labs  11/06/13 1938  PROBNP 1815.0*   CBG:  Recent Labs Lab 11/10/13 1319 11/10/13 1444 11/10/13 1607 11/10/13 2243 11/11/13 0608  GLUCAP 142* 192* 220* 266* 181*     IMAGING STUDIES Dg Chest 2 View  11/08/2013   CLINICAL DATA:  Pleural effusion.  EXAM: CHEST  2 VIEW  COMPARISON:  11/06/2013  FINDINGS: Low lung volumes. Mildly enlarged cardiopericardial silhouette. Mild tortuosity of the thoracic aorta. Mild prominence of the central pulmonary vasculature with cephalization of blood flow.  Reduced perihilar and basilar airspace opacities. Mild blunting of both posterior costophrenic angles.  Thoracic spondylosis.  IMPRESSION: 1. Cardiomegaly with pulmonary venous hypertension but improved edema. 2. Small bilateral pleural effusions. 3. Hilar prominence probably due to vasculature, adenopathy considered less likely. Low lung volumes contribute to this appearance.   Electronically Signed   By: Herbie Baltimore M.D.   On: 11/08/2013 16:48   Dg  Chest 2 View  11/06/2013   CLINICAL DATA:  Shortness of breath.  Renal disorder.  EXAM: CHEST  2 VIEW  COMPARISON:  None.  FINDINGS: Enlarged cardiomediastinal silhouette. BILATERAL airspace opacity with BILATERAL pleural effusions suggesting pulmonary edema. No pneumothorax. No osseous lesions. Degenerative change thoracic spine. Trachea midline.  IMPRESSION: Cardiomegaly with suspected pulmonary edema. BILATERAL pneumonia could have  this appearance, but is less favored.   Electronically Signed   By: Davonna BellingJohn  Curnes M.D.   On: 11/06/2013 21:18    DISCHARGE EXAMINATION: Filed Vitals:   11/10/13 2022 11/11/13 0217 11/11/13 0531 11/11/13 1010  BP: 181/91 190/83 179/81 157/81  Pulse: 75 76 82 72  Temp: 98.1 F (36.7 C) 98.7 F (37.1 C) 99.1 F (37.3 C) 98.9 F (37.2 C)  TempSrc: Oral Oral Oral Oral  Resp: 18 18 18 18   Height:   5\' 10"  (1.778 m)   Weight:   75.1 kg (165 lb 9.1 oz)   SpO2: 99% 100% 100%    General appearance: alert, cooperative, appears stated age and no distress Resp: clear to auscultation bilaterally Cardio: regular rate and rhythm, S1, S2 normal, no murmur, click, rub or gallop GI: soft, non-tender; bowel sounds normal; no masses,  no organomegaly Extremities: LUE fistula site looks clean. Good bruit. Neurologic: No focal deficits  DISPOSITION: Home  Discharge Instructions   Call MD for:  difficulty breathing, headache or visual disturbances    Complete by:  As directed      Call MD for:  persistant dizziness or light-headedness    Complete by:  As directed      Call MD for:  persistant nausea and vomiting    Complete by:  As directed      Call MD for:  severe uncontrolled pain    Complete by:  As directed      Discharge diet:    Complete by:  As directed   Mod carb and Renal     Discharge instructions    Complete by:  As directed   Be sure to take your medications on daily basis. Do not miss any day. Keep your appointments.     Increase activity slowly     Complete by:  As directed            ALLERGIES: No Known Allergies  Current Discharge Medication List    START taking these medications   Details  calcitRIOL (ROCALTROL) 0.25 MCG capsule Take 1 capsule (0.25 mcg total) by mouth daily. Qty: 30 capsule, Refills: 1    furosemide (LASIX) 80 MG tablet Take 2 tablets (160 mg total) by mouth 2 (two) times daily. Qty: 120 tablet, Refills: 2    hydrALAZINE (APRESOLINE) 50 MG tablet Take 1 tablet (50 mg total) by mouth every 8 (eight) hours. Qty: 90 tablet, Refills: 2    isosorbide mononitrate (IMDUR) 30 MG 24 hr tablet Take 1 tablet (30 mg total) by mouth daily. Qty: 30 tablet, Refills: 2    polyethylene glycol (MIRALAX / GLYCOLAX) packet Take 17 g by mouth 2 (two) times daily. Qty: 14 each, Refills: 0    potassium chloride SA (K-DUR,KLOR-CON) 20 MEQ tablet Take 2 tablets (40 mEq total) by mouth 2 (two) times daily. Qty: 60 tablet, Refills: 2      CONTINUE these medications which have CHANGED   Details  amLODipine (NORVASC) 10 MG tablet Take 1 tablet (10 mg total) by mouth daily. Qty: 30 tablet, Refills: 2    carvedilol (COREG) 25 MG tablet Take 1 tablet (25 mg total) by mouth 2 (two) times daily with a meal. Qty: 60 tablet, Refills: 3    rosuvastatin (CRESTOR) 10 MG tablet Take 1 tablet (10 mg total) by mouth 2 (two) times daily. Qty: 30 tablet, Refills: 2      CONTINUE these medications which have NOT CHANGED   Details  Insulin Aspart Prot &  Aspart (NOVOLOG MIX 70/30 FLEXPEN) (70-30) 100 UNIT/ML Pen Inject 15 Units into the skin 2 (two) times daily. Qty: 15 mL, Refills: 11    Lancet Device MISC 1 strip by Does not apply route daily as needed.     glucose blood (BAYER CONTOUR NEXT TEST) test strip 1 each by Other route as needed for other (blood sugar). Use as instructed      STOP taking these medications     amlodipine-olmesartan (AZOR) 10-20 MG per tablet      isosorbide-hydrALAZINE (BIDIL) 20-37.5 MG per tablet         Follow-up Information   Follow up with EARLY, TODD, MD In 5 weeks. (Office will call you to arrange your appt (sent))    Specialty:  Vascular Surgery   Contact information:   [address] [place] [address] [phone]       Schedule an appointment as soon as possible for a visit with Garnetta Buddy, MD. (post hospitalization follow up)    Specialty:  Nephrology   Contact information:   [address] [place] [address] [phone]       Follow up with Garnett Farm, MD. Schedule an appointment as soon as possible for a visit in 4 days. (for kidney mass)    Specialty:  Urology   Contact information:   [address] [place] [address] [phone]       TOTAL DISCHARGE TIME: 35 mins  [facility]  [facility] Pager [phone]  11/11/2013, 11:01 AM  Disclaimer: This note was dictated with voice recognition software. Similar sounding words can inadvertently be transcribed and may not be corrected upon review.

## 2013-11-15 ENCOUNTER — Other Ambulatory Visit: Payer: Self-pay

## 2013-11-15 MED ORDER — GLUCOSE BLOOD VI STRP: ORAL_STRIP | Status: DC

## 2013-11-15 MED ORDER — FREESTYLE SYSTEM KIT: 1.0000 | PACK | Status: DC | PRN

## 2013-11-15 MED ORDER — GLUCOCOM LANCETS 28G MISC: Status: DC

## 2013-11-24 ENCOUNTER — Ambulatory Visit: Payer: No Typology Code available for payment source | Attending: Internal Medicine | Admitting: Internal Medicine

## 2013-11-24 ENCOUNTER — Ambulatory Visit: Payer: No Typology Code available for payment source | Attending: Internal Medicine

## 2013-11-24 VITALS — BP 167/77 | HR 70 | Temp 98.2°F | Resp 20 | Ht 70.0 in | Wt 174.8 lb

## 2013-11-24 DIAGNOSIS — E7800 Pure hypercholesterolemia, unspecified: Secondary | ICD-10-CM | POA: Insufficient documentation

## 2013-11-24 DIAGNOSIS — N183 Chronic kidney disease, stage 3 unspecified: Secondary | ICD-10-CM

## 2013-11-24 DIAGNOSIS — N184 Chronic kidney disease, stage 4 (severe): Secondary | ICD-10-CM | POA: Insufficient documentation

## 2013-11-24 DIAGNOSIS — J9600 Acute respiratory failure, unspecified whether with hypoxia or hypercapnia: Secondary | ICD-10-CM | POA: Insufficient documentation

## 2013-11-24 DIAGNOSIS — I129 Hypertensive chronic kidney disease with stage 1 through stage 4 chronic kidney disease, or unspecified chronic kidney disease: Secondary | ICD-10-CM | POA: Insufficient documentation

## 2013-11-24 DIAGNOSIS — N2889 Other specified disorders of kidney and ureter: Secondary | ICD-10-CM

## 2013-11-24 DIAGNOSIS — R609 Edema, unspecified: Secondary | ICD-10-CM | POA: Insufficient documentation

## 2013-11-24 DIAGNOSIS — E119 Type 2 diabetes mellitus without complications: Secondary | ICD-10-CM

## 2013-11-24 DIAGNOSIS — I509 Heart failure, unspecified: Secondary | ICD-10-CM | POA: Insufficient documentation

## 2013-11-24 DIAGNOSIS — Z794 Long term (current) use of insulin: Secondary | ICD-10-CM | POA: Insufficient documentation

## 2013-11-24 DIAGNOSIS — I5033 Acute on chronic diastolic (congestive) heart failure: Secondary | ICD-10-CM | POA: Insufficient documentation

## 2013-11-24 DIAGNOSIS — N289 Disorder of kidney and ureter, unspecified: Secondary | ICD-10-CM | POA: Insufficient documentation

## 2013-11-24 DIAGNOSIS — E1129 Type 2 diabetes mellitus with other diabetic kidney complication: Secondary | ICD-10-CM | POA: Insufficient documentation

## 2013-11-24 LAB — CBC WITH DIFFERENTIAL/PLATELET
BASOS ABS: 0 10*3/uL (ref 0.0–0.1)
BASOS PCT: 0 % (ref 0–1)
Eosinophils Absolute: 0.1 10*3/uL (ref 0.0–0.7)
Eosinophils Relative: 1 % (ref 0–5)
HCT: 36.8 % — ABNORMAL LOW (ref 39.0–52.0)
Hemoglobin: 12.3 g/dL — ABNORMAL LOW (ref 13.0–17.0)
LYMPHS PCT: 23 % (ref 12–46)
Lymphs Abs: 1.3 10*3/uL (ref 0.7–4.0)
MCH: 27.1 pg (ref 26.0–34.0)
MCHC: 33.4 g/dL (ref 30.0–36.0)
MCV: 81.1 fL (ref 78.0–100.0)
Monocytes Absolute: 0.5 10*3/uL (ref 0.1–1.0)
Monocytes Relative: 9 % (ref 3–12)
NEUTROS ABS: 3.8 10*3/uL (ref 1.7–7.7)
NEUTROS PCT: 67 % (ref 43–77)
PLATELETS: 221 10*3/uL (ref 150–400)
RBC: 4.54 MIL/uL (ref 4.22–5.81)
RDW: 15.8 % — AB (ref 11.5–15.5)
WBC: 5.6 10*3/uL (ref 4.0–10.5)

## 2013-11-24 LAB — POCT GLYCOSYLATED HEMOGLOBIN (HGB A1C): Hemoglobin A1C: 7.2

## 2013-11-24 LAB — GLUCOSE, POCT (MANUAL RESULT ENTRY): POC GLUCOSE: 57 mg/dL — AB (ref 70–99)

## 2013-11-24 NOTE — Progress Notes (Addendum)
Patient presents as HFU for DM, HTN and SHOB Daughter present Newly placed shunt noted in left arm. BP 167/77 taken on right arm.  Snack provided for CBG 57. Patient states he has not had anything this AM except a cup of tea. States he will be eating lunch as soon as he leaves.  Provider aware.

## 2013-11-24 NOTE — Progress Notes (Signed)
CC:  HPI:  68yo male from [place] admitted 8/8 for SOB and volume overload. BP was also elevated upon admission. He had been getting BP meds from his PCP but says his PCP has been out of town and thus has not taken his meds x 2 weeks. He saw Dr Justin Mend for the first time on 09/28/13 and Scr 3.7 (was 3.1 in 4/15). Pr/Cr of 2042m. Neg SPEP and UPEP. PTH 109. HG 11.7. 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  He has 318yrx of DM/HTN. Patient was seen by nephrology and vascular surgery in the hospital for his worsening renal function, he had a left upper arm AV fistula creation done on 8/12. The patient was found to have pulmonary edema secondary to his renal failure along with a component of acute on chronic diastolic heart failure. Initiated on IV Lasix with improvement. Subsequently transitioned to high-dose oral Lasix. Patient was set up to followup with urology for his renal mass. He was told that he will probably require hemodialysis after his kidney removal. Because the patient has an orange card, he has not set up his urology appointment yet. He denies any chest pain shortness of breath. He is compliant with his medications.   No Known Allergies Past Medical History  Diagnosis Date  . Hypertension   . Diabetes mellitus without complication   . Hypercholesteremia   . Renal disorder     in process of being diagnosed   Current Outpatient Prescriptions on File Prior to Visit  Medication Sig Dispense Refill  . amLODipine (NORVASC) 10 MG tablet Take 1 tablet (10 mg total) by mouth daily.  30 tablet  2  . calcitRIOL (ROCALTROL) 0.25 MCG capsule Take 1 capsule (0.25 mcg total) by mouth daily.  30 capsule  1  . carvedilol (COREG) 25 MG tablet Take 1 tablet (25 mg total) by mouth 2 (two) times daily with a meal.  60 tablet  3  . furosemide (LASIX) 80 MG tablet Take 2 tablets (160 mg total) by mouth 2 (two) times daily.  120 tablet  2  . GlucoCom Lancets  MISC Check blood sugar TID & QHS  100 each  4  . glucose blood (BAYER CONTOUR NEXT TEST) test strip 1 each by Other route as needed for other (blood sugar). Use as instructed      . glucose blood (CHOICE DM FORA G20 TEST STRIPS) test strip Use as instructed  100 each  12  . glucose monitoring kit (FREESTYLE) monitoring kit 1 each by Does not apply route as needed for other.  1 each  0  . hydrALAZINE (APRESOLINE) 50 MG tablet Take 1 tablet (50 mg total) by mouth every 8 (eight) hours.  90 tablet  2  . Insulin Aspart Prot & Aspart (NOVOLOG MIX 70/30 FLEXPEN) (70-30) 100 UNIT/ML Pen Inject 15 Units into the skin 2 (two) times daily.  15 mL  11  . isosorbide mononitrate (IMDUR) 30 MG 24 hr tablet Take 1 tablet (30 mg total) by mouth daily.  30 tablet  2  . Lancet Device MISC 1 strip by Does not apply route daily as needed.       . polyethylene glycol (MIRALAX / GLYCOLAX) packet Take 17 g by mouth 2 (two) times daily.  14 each  0  . potassium chloride SA (K-DUR,KLOR-CON) 20 MEQ tablet Take 2 tablets (40 mEq total) by mouth 2 (two) times daily.  60[place]  tablet  2  . rosuvastatin (CRESTOR) 10 MG tablet Take 1 tablet (10 mg total) by mouth 2 (two) times daily.  30 tablet  2   No current facility-administered medications on file prior to visit.   No family history on file. History   Social History  . Marital Status: Married    Spouse Name: N/A    Number of Children: N/A  . Years of Education: N/A   Occupational History  . Not on file.   Social History Main Topics  . Smoking status: Never Smoker   . Smokeless tobacco: Not on file  . Alcohol Use: No  . Drug Use: No  . Sexual Activity: Not Currently   Other Topics Concern  . Not on file   Social History Narrative  . No narrative on file    Review of Systems  Constitutional: Negative for fever, chills, diaphoresis, activity change, appetite change and fatigue.  HENT: Negative for ear pain, nosebleeds, congestion, facial swelling, rhinorrhea,  neck pain, neck stiffness and ear discharge.   Eyes: Negative for pain, discharge, redness, itching and visual disturbance.  Respiratory: Negative for cough, choking, chest tightness, shortness of breath, wheezing and stridor.   Cardiovascular: Negative for chest pain, palpitations and leg swelling.  Gastrointestinal: Negative for abdominal distention.  Genitourinary: Negative for dysuria, urgency, frequency, hematuria, flank pain, decreased urine volume, difficulty urinating and dyspareunia.  Musculoskeletal: Negative for back pain, joint swelling, arthralgias and gait problem.  Neurological: Negative for dizziness, tremors, seizures, syncope, facial asymmetry, speech difficulty, weakness, light-headedness, numbness and headaches.  Hematological: Negative for adenopathy. Does not bruise/bleed easily.  Psychiatric/Behavioral: Negative for hallucinations, behavioral problems, confusion, dysphoric mood, decreased concentration and agitation.    Objective:   Filed Vitals:   11/24/13 1217  BP: 167/77  Pulse: 70  Temp: 98.2 F (36.8 C)  Resp: 20    Physical Exam  Constitutional: Appears well-developed and well-nourished. No distress.  HENT: Normocephalic. External right and left ear normal. Oropharynx is clear and moist.  Eyes: Conjunctivae and EOM are normal. PERRLA, no scleral icterus.  Neck: Normal ROM. Neck supple. No JVD. No tracheal deviation. No thyromegaly.  CVS: RRR, S1/S2 +, no murmurs, no gallops, no carotid bruit.  Pulmonary: Effort and breath sounds normal, no stridor, rhonchi, wheezes, rales.  Abdominal: Soft. BS +,  no distension, tenderness, rebound or guarding.  Musculoskeletal: Normal range of motion. No edema and no tenderness.  Lymphadenopathy: No lymphadenopathy noted, cervical, inguinal. Neuro: Alert. Normal reflexes, muscle tone coordination. No cranial nerve deficit. Skin: Skin is warm and dry. No rash noted. Not diaphoretic. No erythema. No pallor.  Psychiatric:  Normal mood and affect. Behavior, judgment, thought content normal.   Lab Results  Component Value Date   WBC 4.9 11/11/2013   HGB 13.0 11/11/2013   HCT 41.0 11/11/2013   MCV 84.9 11/11/2013   PLT 170 11/11/2013   Lab Results  Component Value Date   CREATININE 3.96* 11/11/2013   BUN 40* 11/11/2013   NA 143 11/11/2013   K 4.5 11/11/2013   CL 104 11/11/2013   CO2 23 11/11/2013    No results found for this basename: HGBA1C   Lipid Panel  No results found for this basename: chol, trig, hdl, cholhdl, vldl, ldlcalc       Assessment and plan:   Patient Active Problem List   Diagnosis Date Noted  . Renal mass 11/10/2013  . CKD (chronic kidney disease) stage 3, GFR 30-59 ml/min 11/10/2013  . Hypertension, renal disease  11/10/2013  . Pleural effusion, bilateral 11/06/2013  . Acute renal failure superimposed on stage 3 chronic kidney disease 11/06/2013  . DM II (diabetes mellitus, type II), controlled 11/06/2013  . Hyperlipidemia 11/06/2013  . Accelerated hypertension 11/06/2013     Acute Respiratory Failure  This was due to pulmonary edema secondary to renal failure, now stable. There was also a component of diastolic Heart failure. Symptoms have improved. His last chest x-ray was on 8/10. Given no acute cardiopulmonary symptoms her that I did not feel that another chest x-ray is needed  Acute on chronic diastolic Heart Failure  Improving on lasix, hydralazine, Imdur, Coreg Will recheck his CMP today  CKD, stage IV  Nephrology was consulted in the hospital. It is felt that he may require dialysis once his kidney is removed. So an AV fistula was placed by vascular surgery. Not to be used for at least 12 weeks. Nephrology to follow as OP.  Patient will follow up with nephrology we'll also provide him with his referral   Renal Mass  Patient apparently has a renal mass and follows with Urology. He will likely need to undergo surgery in near future. Urology referral has been  provided.   Diabetes Mellitus Type 2 with renal Complications  Patient is a insulin 70/30 Will check hemoglobin A1c today  Accelerated Hypertension  Bp has been poorly controlled prior to admission to the hospital. Medications changes were made. Lasix was added. Hydralazine and Coreg dose was increased.    Bilateral LE edema,worse on the right Improved  Patient to follow up back with [place] in one month  The patient was given clear instructions to go to ER or return to medical center if symptoms don't improve, worsen or new problems develop. The patient verbalized understanding. The patient was told to call to get any lab results if not heard anything in the next week.

## 2013-11-25 LAB — COMPLETE METABOLIC PANEL WITH GFR
ALBUMIN: 4 g/dL (ref 3.5–5.2)
ALT: 20 U/L (ref 0–53)
AST: 16 U/L (ref 0–37)
Alkaline Phosphatase: 70 U/L (ref 39–117)
BUN: 56 mg/dL — ABNORMAL HIGH (ref 6–23)
CHLORIDE: 99 meq/L (ref 96–112)
CO2: 29 mEq/L (ref 19–32)
Calcium: 9.1 mg/dL (ref 8.4–10.5)
Creat: 4.36 mg/dL — ABNORMAL HIGH (ref 0.50–1.35)
GFR, Est African American: 15 mL/min — ABNORMAL LOW
GFR, Est Non African American: 13 mL/min — ABNORMAL LOW
Glucose, Bld: 55 mg/dL — ABNORMAL LOW (ref 70–99)
POTASSIUM: 4.7 meq/L (ref 3.5–5.3)
Sodium: 140 mEq/L (ref 135–145)
Total Bilirubin: 0.4 mg/dL (ref 0.2–1.2)
Total Protein: 7.2 g/dL (ref 6.0–8.3)

## 2013-12-13 ENCOUNTER — Encounter: Payer: Self-pay | Admitting: Vascular Surgery

## 2013-12-14 ENCOUNTER — Encounter: Payer: Self-pay | Admitting: Vascular Surgery

## 2013-12-14 ENCOUNTER — Ambulatory Visit (HOSPITAL_COMMUNITY)
Admit: 2013-12-14 | Discharge: 2013-12-14 | Disposition: A | Discharge status: Home / Self Care | Payer: No Typology Code available for payment source | Source: Ambulatory Visit | Attending: Vascular Surgery | Admitting: Vascular Surgery

## 2013-12-14 ENCOUNTER — Ambulatory Visit (INDEPENDENT_AMBULATORY_CARE_PROVIDER_SITE_OTHER): Payer: Self-pay | Admitting: Vascular Surgery

## 2013-12-14 VITALS — BP 180/90 | HR 69 | Resp 16 | Ht 70.0 in | Wt 182.0 lb

## 2013-12-14 DIAGNOSIS — Z4931 Encounter for adequacy testing for hemodialysis: Secondary | ICD-10-CM

## 2013-12-14 DIAGNOSIS — N186 End stage renal disease: Secondary | ICD-10-CM

## 2013-12-14 NOTE — Progress Notes (Signed)
Patient is here today for followup of his left upper arm AV fistula creation on 11/10/2013. He was hospitalized during that treatment. He reports that his renal function has stabilized although he is a poor historian. I do not have any lab data for evaluation.  On physical exam his antecubital incision is well-healed and he has an excellent curl throughout the fistula in his upper arm. He does have some tortuosity of this and he does have some curvature branches but the main saphenous vein is dilating quite nicely.  Venous duplex today was evaluated dystocia of good size maturation throughout the cephalic vein fistula. EA velocities [place] anastomosis which appears to be related to change in caliber.  Impression and plan excellent initial result of left upper arm AV fistula creation one month ago. Will continue his followup with nephrology. We'll see [place] on as-needed basis. Will of this fistula has an excellent chance of being acceptable for hemodialysis if he progresses to end-stage renal disease

## 2014-01-14 ENCOUNTER — Ambulatory Visit: Payer: Self-pay | Admitting: Internal Medicine

## 2014-01-24 ENCOUNTER — Ambulatory Visit: Payer: Self-pay | Admitting: Internal Medicine

## 2014-02-14 ENCOUNTER — Encounter (HOSPITAL_COMMUNITY): Payer: Self-pay | Admitting: Emergency Medicine

## 2014-02-14 ENCOUNTER — Emergency Department (HOSPITAL_COMMUNITY)
Admission: EM | Admit: 2014-02-14 | Discharge: 2014-02-14 | Disposition: A | Discharge status: Home / Self Care | Payer: No Typology Code available for payment source | Source: Home / Self Care | Attending: Emergency Medicine | Admitting: Emergency Medicine

## 2014-02-14 DIAGNOSIS — J40 Bronchitis, not specified as acute or chronic: Secondary | ICD-10-CM

## 2014-02-14 MED ORDER — IPRATROPIUM-ALBUTEROL 0.5-2.5 (3) MG/3ML IN SOLN
RESPIRATORY_TRACT | Status: AC
  Filled 2014-02-14: qty 3

## 2014-02-14 MED ORDER — PREDNISONE 50 MG PO TABS: ORAL_TABLET | ORAL | Status: DC

## 2014-02-14 MED ORDER — DOXYCYCLINE HYCLATE 100 MG PO CAPS: 100.0000 mg | ORAL_CAPSULE | Freq: Two times a day (BID) | ORAL | Status: DC

## 2014-02-14 MED ORDER — IPRATROPIUM-ALBUTEROL 0.5-2.5 (3) MG/3ML IN SOLN
3.0000 mL | Freq: Once | RESPIRATORY_TRACT | Status: AC
  Administered 2014-02-14: 3 mL via RESPIRATORY_TRACT

## 2014-02-14 NOTE — Discharge Instructions (Signed)
You have bronchitis. Take prednisone 1 pill daily for 5 days. Take doxycycline 1 pill twice a day for 10 days. You can continue to use Robitussin as needed for cough.  If you develop fevers, worsening shortness of breath please go to the emergency room.

## 2014-02-14 NOTE — ED Provider Notes (Signed)
CSN: [id number]     Arrival date & time 02/14/14  1632 History   First MD Initiated Contact with Patient 02/14/14 1729     Chief Complaint  Patient presents with  . Shortness of Breath  . Cough   (Consider location/radiation/quality/duration/timing/severity/associated sxs/prior Treatment) HPI  He is a 68 year old man here for evaluation of cough. He states his symptoms started about a week ago with cough, rhinorrhea, nasal congestion. The nasal symptoms have resolved, but the cough persists. He denies any sore throat or fevers. He does report a decreased appetite. No nausea or vomiting. No diarrhea. He describes some dyspnea on exertion as well as shortness of breath associated with coughing.  Past Medical History  Diagnosis Date  . Hypertension   . Diabetes mellitus without complication   . Hypercholesteremia   . Renal disorder     in process of being diagnosed   Past Surgical History  Procedure Laterality Date  . Av fistula placement Left 11/10/2013    Procedure: LEFT ARM  ARTERIOVENOUS (AV) FISTULA CREATION;  Surgeon: Rosetta Posner, MD;  Location: [facility];  Service: Vascular;  Laterality: Left;   No family history on file. History  Substance Use Topics  . Smoking status: Never Smoker   . Smokeless tobacco: Never Used  . Alcohol Use: No    Review of Systems  Constitutional: Positive for appetite change. Negative for fever and chills.  HENT: Positive for congestion and rhinorrhea. Negative for sore throat.   Respiratory: Positive for cough and shortness of breath.   Gastrointestinal: Positive for constipation (mild). Negative for nausea, vomiting and diarrhea.  Musculoskeletal: Negative for myalgias.  Neurological: Positive for headaches.    Allergies  Review of patient's allergies indicates no known allergies.  Home Medications   Prior to Admission medications   Medication Sig Start Date End Date Taking? Authorizing Provider  amLODipine (NORVASC) 10 MG tablet Take 1  tablet (10 mg total) by mouth daily. 11/11/13   Bonnielee Haff, MD  calcitRIOL (ROCALTROL) 0.25 MCG capsule Take 1 capsule (0.25 mcg total) by mouth daily. 11/11/13   Bonnielee Haff, MD  carvedilol (COREG) 25 MG tablet Take 1 tablet (25 mg total) by mouth 2 (two) times daily with a meal. 11/11/13   Bonnielee Haff, MD  doxycycline (VIBRAMYCIN) 100 MG capsule Take 1 capsule (100 mg total) by mouth 2 (two) times daily. 02/14/14   Melony Overly, MD  furosemide (LASIX) 80 MG tablet Take 2 tablets (160 mg total) by mouth 2 (two) times daily. 11/11/13   Bonnielee Haff, MD  GlucoCom Lancets MISC Check blood sugar TID & QHS 11/15/13   Lorayne Marek, MD  glucose blood (BAYER CONTOUR NEXT TEST) test strip 1 each by Other route as needed for other (blood sugar). Use as instructed    Historical Provider, MD  glucose blood (CHOICE DM FORA G20 TEST STRIPS) test strip Use as instructed 11/15/13   Lorayne Marek, MD  glucose monitoring kit (FREESTYLE) monitoring kit 1 each by Does not apply route as needed for other. 11/15/13   Lorayne Marek, MD  hydrALAZINE (APRESOLINE) 50 MG tablet Take 1 tablet (50 mg total) by mouth every 8 (eight) hours. 11/11/13   Bonnielee Haff, MD  Insulin Aspart Prot & Aspart (NOVOLOG MIX 70/30 FLEXPEN) (70-30) 100 UNIT/ML Pen Inject 15 Units into the skin 2 (two) times daily. 11/06/13   Harden Mo, MD  isosorbide mononitrate (IMDUR) 30 MG 24 hr tablet Take 1 tablet (30 mg total) by mouth daily.  11/11/13   Bonnielee Haff, MD  Lancet Device MISC 1 strip by Does not apply route daily as needed.     Historical Provider, MD  polyethylene glycol (MIRALAX / GLYCOLAX) packet Take 17 g by mouth 2 (two) times daily. 11/11/13   Bonnielee Haff, MD  potassium chloride SA (K-DUR,KLOR-CON) 20 MEQ tablet Take 2 tablets (40 mEq total) by mouth 2 (two) times daily. 11/11/13   Bonnielee Haff, MD  predniSONE (DELTASONE) 50 MG tablet Take 1 pill daily for 5 days. 02/14/14   Melony Overly, MD  rosuvastatin (CRESTOR) 10 MG  tablet Take 1 tablet (10 mg total) by mouth 2 (two) times daily. 11/11/13   Bonnielee Haff, MD   BP 177/82 mmHg  Pulse 84  Temp(Src) 98.5 F (36.9 C) (Oral)  Resp 20  SpO2 93% Physical Exam  Constitutional: He is oriented to person, place, and time. He appears well-developed and well-nourished. No distress.  Looks unwell.  HENT:  Head: Normocephalic and atraumatic.  Right Ear: External ear normal.  Left Ear: External ear normal.  Nose: Nose normal.  Mouth/Throat: Oropharynx is clear and moist. No oropharyngeal exudate.  Eyes: Conjunctivae are normal. Right eye exhibits no discharge. Left eye exhibits no discharge.  Neck: Neck supple.  Cardiovascular: Normal rate, regular rhythm and normal heart sounds.   No murmur heard. Pulmonary/Chest: Effort normal. No respiratory distress. He has no wheezes. He has no rales.  Scattered rhonchi  Neurological: He is alert and oriented to person, place, and time.    ED Course  Procedures (including critical care time) Labs Review Labs Reviewed - No data to display  Imaging Review No results found.   MDM   1. Bronchitis    We'll treat with DuoNeb.  He does not report subjective improvement after the breathing treatment, however his rhonchi have cleared.  Will treat with doxycycline and prednisone. Okay to use Robitussin as needed for cough. Reviewed reasons to return as in after visit summary.    Melony Overly, MD 02/14/14 [phone]

## 2014-02-14 NOTE — ED Notes (Signed)
Sob, cough for a week.

## 2014-03-15 ENCOUNTER — Inpatient Hospital Stay (HOSPITAL_COMMUNITY)
Admission: EM | Admit: 2014-03-15 | Discharge: 2014-03-17 | DRG: 292 | Disposition: A | Discharge status: Home / Self Care | Payer: No Typology Code available for payment source | Attending: Internal Medicine | Admitting: Internal Medicine

## 2014-03-15 ENCOUNTER — Emergency Department (HOSPITAL_COMMUNITY)
Admission: EM | Admit: 2014-03-15 | Discharge: 2014-03-15 | Disposition: A | Discharge status: Nursing Home (Includes ICF) | Payer: No Typology Code available for payment source | Source: Home / Self Care | Attending: Emergency Medicine | Admitting: Emergency Medicine

## 2014-03-15 ENCOUNTER — Encounter (HOSPITAL_COMMUNITY): Payer: Self-pay | Admitting: *Deleted

## 2014-03-15 ENCOUNTER — Other Ambulatory Visit: Payer: Self-pay

## 2014-03-15 ENCOUNTER — Emergency Department (HOSPITAL_COMMUNITY): Payer: No Typology Code available for payment source

## 2014-03-15 ENCOUNTER — Encounter (HOSPITAL_COMMUNITY): Payer: Self-pay

## 2014-03-15 DIAGNOSIS — I509 Heart failure, unspecified: Secondary | ICD-10-CM

## 2014-03-15 DIAGNOSIS — R0682 Tachypnea, not elsewhere classified: Secondary | ICD-10-CM | POA: Diagnosis present

## 2014-03-15 DIAGNOSIS — N186 End stage renal disease: Secondary | ICD-10-CM | POA: Diagnosis present

## 2014-03-15 DIAGNOSIS — I272 Other secondary pulmonary hypertension: Secondary | ICD-10-CM | POA: Diagnosis present

## 2014-03-15 DIAGNOSIS — E119 Type 2 diabetes mellitus without complications: Secondary | ICD-10-CM

## 2014-03-15 DIAGNOSIS — I120 Hypertensive chronic kidney disease with stage 5 chronic kidney disease or end stage renal disease: Secondary | ICD-10-CM | POA: Diagnosis present

## 2014-03-15 DIAGNOSIS — Z23 Encounter for immunization: Secondary | ICD-10-CM

## 2014-03-15 DIAGNOSIS — I313 Pericardial effusion (noninflammatory): Secondary | ICD-10-CM | POA: Diagnosis present

## 2014-03-15 DIAGNOSIS — I5032 Chronic diastolic (congestive) heart failure: Secondary | ICD-10-CM | POA: Diagnosis present

## 2014-03-15 DIAGNOSIS — N185 Chronic kidney disease, stage 5: Secondary | ICD-10-CM | POA: Diagnosis present

## 2014-03-15 DIAGNOSIS — R0902 Hypoxemia: Secondary | ICD-10-CM | POA: Diagnosis present

## 2014-03-15 DIAGNOSIS — J90 Pleural effusion, not elsewhere classified: Secondary | ICD-10-CM | POA: Diagnosis present

## 2014-03-15 DIAGNOSIS — N2889 Other specified disorders of kidney and ureter: Secondary | ICD-10-CM | POA: Diagnosis present

## 2014-03-15 DIAGNOSIS — I10 Essential (primary) hypertension: Secondary | ICD-10-CM | POA: Diagnosis present

## 2014-03-15 DIAGNOSIS — Z794 Long term (current) use of insulin: Secondary | ICD-10-CM

## 2014-03-15 DIAGNOSIS — I5033 Acute on chronic diastolic (congestive) heart failure: Principal | ICD-10-CM | POA: Diagnosis present

## 2014-03-15 DIAGNOSIS — E780 Pure hypercholesterolemia: Secondary | ICD-10-CM | POA: Diagnosis present

## 2014-03-15 DIAGNOSIS — R0602 Shortness of breath: Secondary | ICD-10-CM

## 2014-03-15 DIAGNOSIS — E8770 Fluid overload, unspecified: Secondary | ICD-10-CM | POA: Insufficient documentation

## 2014-03-15 LAB — BASIC METABOLIC PANEL
Anion gap: 12 (ref 5–15)
BUN: 48 mg/dL — AB (ref 6–23)
CO2: 26 meq/L (ref 19–32)
CREATININE: 3.67 mg/dL — AB (ref 0.50–1.35)
Calcium: 8.6 mg/dL (ref 8.4–10.5)
Chloride: 99 mEq/L (ref 96–112)
GFR calc non Af Amer: 15 mL/min — ABNORMAL LOW (ref >90)
GFR, EST AFRICAN AMERICAN: 18 mL/min — AB (ref >90)
Glucose, Bld: 235 mg/dL — ABNORMAL HIGH (ref 70–99)
Potassium: 3.8 mEq/L (ref 3.7–5.3)
Sodium: 137 mEq/L (ref 137–147)

## 2014-03-15 LAB — I-STAT TROPONIN, ED: Troponin i, poc: 0.04 ng/mL (ref 0.00–0.08)

## 2014-03-15 LAB — CBC
HCT: 33.6 % — ABNORMAL LOW (ref 39.0–52.0)
Hemoglobin: 11.1 g/dL — ABNORMAL LOW (ref 13.0–17.0)
MCH: 26.7 pg (ref 26.0–34.0)
MCHC: 33 g/dL (ref 30.0–36.0)
MCV: 81 fL (ref 78.0–100.0)
Platelets: 226 10*3/uL (ref 150–400)
RBC: 4.15 MIL/uL — ABNORMAL LOW (ref 4.22–5.81)
RDW: 15.2 % (ref 11.5–15.5)
WBC: 3.4 10*3/uL — ABNORMAL LOW (ref 4.0–10.5)

## 2014-03-15 LAB — PRO B NATRIURETIC PEPTIDE: Pro B Natriuretic peptide (BNP): 4212 pg/mL — ABNORMAL HIGH (ref 0–125)

## 2014-03-15 MED ORDER — FUROSEMIDE 10 MG/ML IJ SOLN
80.0000 mg | Freq: Once | INTRAMUSCULAR | Status: AC
  Administered 2014-03-15: 80 mg via INTRAVENOUS
  Filled 2014-03-15: qty 8

## 2014-03-15 NOTE — ED Notes (Signed)
EKG shows no change from previous.  Dr. Lorenz CoasterKeller said pt. and be discharged and taken by daughter to ED.

## 2014-03-15 NOTE — ED Notes (Addendum)
C/o SOB x 2-3 days.  Had bronchitis 1 month ago and took Doxycycline and Prednisone and got better.  Has slight cough.  No fever.  Never smoked. Ran out of Lasix, Crestor, Calcitrol and Imdur 3 days ago.  Denies chest pain.

## 2014-03-15 NOTE — ED Notes (Signed)
Ambulated pt around pod x 1.  O2 sats maintained between 89-93% on RA; expiratory wheezing heard.  Pt stated he felt "fine" ambulating.  EDP made aware.

## 2014-03-15 NOTE — ED Notes (Addendum)
[facility]. Has a dry non-productive cough for the past 3-4 days as well. Denies any cp or other symptoms. Pt has a fistula in left arm but has not started dialysis yet. Was suppose to have kidney removed on 3rd of December but didn't because he was retaining too much fluid.

## 2014-03-15 NOTE — ED Provider Notes (Signed)
CSN: [id number]     Arrival date & time 03/15/14  1833 History   First MD Initiated Contact with Patient 03/15/14 2059     Chief Complaint  Patient presents with  . Shortness of Breath     (Consider location/radiation/quality/duration/timing/severity/associated sxs/prior Treatment) Patient is a 68 y.o. male presenting with shortness of breath.  Shortness of Breath Severity:  Moderate Onset quality:  Gradual Duration:  3 days Timing:  Constant Progression:  Worsening Chronicity:  Recurrent Context: not activity and not pollens   Relieved by:  None tried Worsened by:  Deep breathing Ineffective treatments:  None tried Associated symptoms: cough   Associated symptoms: no abdominal pain, no chest pain, no rash and no vomiting   Risk factors: no recent alcohol use     Past Medical History  Diagnosis Date  . Hypertension   . Diabetes mellitus without complication   . Hypercholesteremia   . Renal disorder     in process of being diagnosed   Past Surgical History  Procedure Laterality Date  . Av fistula placement Left 11/10/2013    Procedure: LEFT ARM  ARTERIOVENOUS (AV) FISTULA CREATION;  Surgeon: Rosetta Posner, MD;  Location: [facility];  Service: Vascular;  Laterality: Left;   History reviewed. No pertinent family history. History  Substance Use Topics  . Smoking status: Never Smoker   . Smokeless tobacco: Never Used  . Alcohol Use: No    Review of Systems  Respiratory: Positive for cough and shortness of breath.   Cardiovascular: Positive for leg swelling. Negative for chest pain.  Gastrointestinal: Negative for vomiting, abdominal pain and abdominal distention.  Skin: Negative for rash and wound.  All other systems reviewed and are negative.     Allergies  Review of patient's allergies indicates no known allergies.  Home Medications   Prior to Admission medications   Medication Sig Start Date End Date Taking? Authorizing Provider  amLODipine (NORVASC) 10 MG  tablet Take 1 tablet (10 mg total) by mouth daily. 11/11/13   Bonnielee Haff, MD  calcitRIOL (ROCALTROL) 0.25 MCG capsule Take 1 capsule (0.25 mcg total) by mouth daily. 11/11/13   Bonnielee Haff, MD  carvedilol (COREG) 25 MG tablet Take 1 tablet (25 mg total) by mouth 2 (two) times daily with a meal. 11/11/13   Bonnielee Haff, MD  doxycycline (VIBRAMYCIN) 100 MG capsule Take 1 capsule (100 mg total) by mouth 2 (two) times daily. 02/14/14   Melony Overly, MD  furosemide (LASIX) 80 MG tablet Take 2 tablets (160 mg total) by mouth 2 (two) times daily. 11/11/13   Bonnielee Haff, MD  GlucoCom Lancets MISC Check blood sugar TID & QHS 11/15/13   Lorayne Marek, MD  glucose blood (BAYER CONTOUR NEXT TEST) test strip 1 each by Other route as needed for other (blood sugar). Use as instructed    Historical Provider, MD  glucose blood (CHOICE DM FORA G20 TEST STRIPS) test strip Use as instructed 11/15/13   Lorayne Marek, MD  glucose monitoring kit (FREESTYLE) monitoring kit 1 each by Does not apply route as needed for other. 11/15/13   Lorayne Marek, MD  hydrALAZINE (APRESOLINE) 50 MG tablet Take 1 tablet (50 mg total) by mouth every 8 (eight) hours. 11/11/13   Bonnielee Haff, MD  Insulin Aspart Prot & Aspart (NOVOLOG MIX 70/30 FLEXPEN) (70-30) 100 UNIT/ML Pen Inject 15 Units into the skin 2 (two) times daily. 11/06/13   Harden Mo, MD  insulin NPH-regular Human (NOVOLIN 70/30) (70-30) 100 UNIT/ML  injection Inject 15 Units into the skin 2 (two) times daily with a meal.    Historical Provider, MD  isosorbide mononitrate (IMDUR) 30 MG 24 hr tablet Take 1 tablet (30 mg total) by mouth daily. 11/11/13   Bonnielee Haff, MD  Lancet Device MISC 1 strip by Does not apply route daily as needed.     Historical Provider, MD  polyethylene glycol (MIRALAX / GLYCOLAX) packet Take 17 g by mouth 2 (two) times daily. 11/11/13   Bonnielee Haff, MD  potassium chloride SA (K-DUR,KLOR-CON) 20 MEQ tablet Take 2 tablets (40 mEq total) by mouth  2 (two) times daily. 11/11/13   Bonnielee Haff, MD  predniSONE (DELTASONE) 50 MG tablet Take 1 pill daily for 5 days. 02/14/14   Melony Overly, MD  rosuvastatin (CRESTOR) 10 MG tablet Take 1 tablet (10 mg total) by mouth 2 (two) times daily. 11/11/13   Bonnielee Haff, MD   BP 191/87 mmHg  Pulse 76  Temp(Src) 98.3 F (36.8 C) (Oral)  Resp 25  Ht _0  (1.778 m)  Wt 190 lb (86.183 kg)  BMI 27.26 kg/m2  SpO2 96% Physical Exam  Constitutional: He is oriented to person, place, and time. He appears well-developed and well-nourished.  HENT:  Head: Normocephalic and atraumatic.  Eyes: Conjunctivae and EOM are normal.  Neck: Normal range of motion. Neck supple.  Cardiovascular: Normal rate and regular rhythm.   Pulmonary/Chest: Effort normal. No respiratory distress. He has no decreased breath sounds. He has no wheezes. He has no rales.  Abdominal: Soft. There is no tenderness.  Musculoskeletal: Normal range of motion. He exhibits edema. He exhibits no tenderness.  Neurological: He is alert and oriented to person, place, and time.  Skin: Skin is warm and dry.  Nursing note and vitals reviewed.   ED Course  Procedures (including critical care time) Labs Review Labs Reviewed  CBC - Abnormal; Notable for the following:    WBC 3.4 (*)    RBC 4.15 (*)    Hemoglobin 11.1 (*)    HCT 33.6 (*)    All other components within normal limits  BASIC METABOLIC PANEL - Abnormal; Notable for the following:    Glucose, Bld 235 (*)    BUN 48 (*)    Creatinine, Ser 3.67 (*)    GFR calc non Af Amer 15 (*)    GFR calc Af Amer 18 (*)    All other components within normal limits  PRO B NATRIURETIC PEPTIDE - Abnormal; Notable for the following:    Pro B Natriuretic peptide (BNP) 4212.0 (*)    All other components within normal limits  I-STAT TROPOININ, ED    Imaging Review Dg Chest 2 View  03/15/2014   CLINICAL DATA:  Initial evaluation for shortness of breath.  EXAM: CHEST  2 VIEW  COMPARISON:   Prior radiograph from 10/28/2013.  FINDINGS: Cardiomegaly is stable from prior study. Tortuosity intrathoracic aorta noted.  Lungs are mildly hypoinflated. There are extensive bibasilar patchy and linear opacities, which may reflect atelectasis or infiltrates. There is mild diffuse pulmonary vascular congestion without overt pulmonary edema. Minimal blunting of the right costophrenic angle suggests a small right pleural effusion. No pneumothorax.  No acute osseus abnormality.  IMPRESSION: 1. Bibasilar patchy and linear opacities. While these findings may reflect atelectasis, possible infectious infiltrates could be considered in the correct clinical setting. 2. Cardiomegaly with mild diffuse pulmonary vascular congestion without overt pulmonary edema. 3. Minimal blunting of the right costophrenic angle, suggestive of  tiny right pleural effusion.   Electronically Signed   By: Jeannine Boga M.D.   On: 03/15/2014 21:05     EKG Interpretation None      MDM   Final diagnoses:  SOB (shortness of breath)    68 yo M w/ ESRD (getting ready for dialysis) and heart failure here with fluid overload likely 2/2 not having meds for the last few days. Elevated BNP. No acute issues. Will ensure diuresis starts here and likely dc on TID lasix with PCP follow up.  Patient with good response to lasix. However BNP significantly elevated, quite tachypneic and hypoxic to 89 while walking. Will admit for more diuresis.     Merrily Pew, MD 03/16/14 [place], MD 03/16/14 1536

## 2014-03-15 NOTE — ED Provider Notes (Signed)
Patient presented to the ER with shortness of breath. Patient reports that he has been appearance and increased shortness of breath for the last 3-4 days. Patient does have a history of renal failure, recently had a fistula placed in his arm in anticipation of initiation of dialysis. Patient has been prescribed increasing doses of Lasix recently for volume overload. He ran out of his Lasix 3 days ago, has not taken any since. He is not expressing any chest pain.  Face to face Exam: HEENT - PERRLA Lungs - faint crackles Heart - RRR, no M/R/G Abd - S/NT/ND Neuro - alert, oriented x3  Plan: Initiated diuresis. Patient will be given IV Lasix here in the ER. If he has significant diuresis, no hypoxia, can be discharged home on oral Lasix and follow-up with his doctor.   Gilda Creasehristopher J. Taziah Difatta, MD 03/15/14 2219

## 2014-03-15 NOTE — Discharge Instructions (Signed)
We have determined that your problem requires further evaluation in the emergency department.  We will take care of your transport there.  Once at the emergency department, you will be evaluated by a provider and they will order whatever treatment or tests they deem necessary.  We cannot guarantee that they will do any specific test or do any specific treatment.  ° °

## 2014-03-15 NOTE — ED Provider Notes (Signed)
Chief Complaint   Shortness of Breath   History of Present Illness   Michael Dorsey is a 68 year old male with multiple medical problems including diabetes, hypertension, congestive heart failure, coronary artery disease, chronic kidney disease, and hypercholesterolemia. He presents tonight with a one-week history of shortness of breath with exertion. He gets short of breath with minimal exertion such as climbing one flight of steps or walking about 100 feet. He's had a slight nonproductive cough. No wheezing or chest tightness. He denies any chest pain, tightness, pressure, or discomfort of any common. He has had some leg swelling, PND, and orthopnea and he has a history of congestive heart failure and was hospitalized for it last August. He was seen here month ago and diagnosed with bronchitis. He was given doxycycline and prednisone got better.  Review of Systems   Other than as noted above, the patient denies any of the following symptoms. Systemic:  No fever or chills. ENT:  No nasal congestion, rhinorrhea, or sore throat. Pulmonary:  No wheezing, sputum production, hemoptysis. Cardiac:  No chest pain, palpitations, rapid heartbeat, dizziness, presyncope or syncope. Skin:  No rash or itching. Ext:  No leg pain. Psych:  No anxiety or depression.  PMFSH   Past medical history, family history, social history, meds, and allergies were reviewed. He has no medication allergies. He has a history of diabetes, chronic kidney disease, hypertension, congestive heart failure, coronary artery disease, pleural effusions, and hypercholesterolemia. Current medications include amlodipine, carvedilol, furosemide, hydralazine, Novolin 70/30, Imdur, Crestor, Calcitrol, and MiraLAX. He has run out of multiple medications including his furosemide and isosorbide mononitrate.  Physical Examination    Vital signs:  There were no vitals taken for this visit. Gen:  Alert, oriented, in no distress, skin  warm and dry. ENT:  Mucous membranes moist, pharynx clear. Neck:  Supple, no adenopathy or tenderness.  No JVD. Lungs:  He has diminished breath sounds at both bases and rales about halfway up both lung fields.  Heart:  Regular rhythm.  No gallops, murmers, clicks or rubs. Abdomen:  Soft, nontender, no organomegaly or mass.  Bowel sounds normal.  No pulsatile abdominal mass or bruit. He has a fistula in the left arm. Ext:  He has pitting edema both ankles.  No calf tenderness and Homann's sign negative.   Skin:  Warm and dry.  No rash.   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 EKG is unchanged since his previous EKG from last August.  Old EKG Reviewed: unchanged   Assessment   The encounter diagnosis was Acute on chronic congestive heart failure, unspecified congestive heart failure type.  His symptoms are due to congestive heart failure and he would benefit from diuresis. He is stable right now and in no acute distress, therefore we'll allow his daughter to transport him to the emergency room.  Plan    The patient was transferred to the ED via private vehicle in stable condition.  Medical Decision Making:  68 year old male with a one-week history of shortness of breath with exertion, ankle edema, PND, and orthopnea. He has a history of congestive heart failure and bilateral pleural effusions. He also has multiple other chronic medical comorbidities including diabetes, hypertension, and chronic kidney disease. On examination  he has pitting edema both ankles, diminished breath sounds at his bases, and rales halfway up both lung fields posteriorly. There is no JVD. He's in  no acute distress right now. His O2 sat is 93%. I believe he would benefit from diuresis. Since he is stable and not in any acute distress, I will allow his daughter to transport him to the emergency room.       Michael Likesavid C Zacharia Sowles, MD 03/15/14 [phone]

## 2014-03-16 DIAGNOSIS — I10 Essential (primary) hypertension: Secondary | ICD-10-CM

## 2014-03-16 DIAGNOSIS — I5032 Chronic diastolic (congestive) heart failure: Secondary | ICD-10-CM | POA: Diagnosis present

## 2014-03-16 DIAGNOSIS — N186 End stage renal disease: Secondary | ICD-10-CM

## 2014-03-16 DIAGNOSIS — N2889 Other specified disorders of kidney and ureter: Secondary | ICD-10-CM

## 2014-03-16 DIAGNOSIS — E119 Type 2 diabetes mellitus without complications: Secondary | ICD-10-CM

## 2014-03-16 DIAGNOSIS — I369 Nonrheumatic tricuspid valve disorder, unspecified: Secondary | ICD-10-CM

## 2014-03-16 DIAGNOSIS — J90 Pleural effusion, not elsewhere classified: Secondary | ICD-10-CM

## 2014-03-16 DIAGNOSIS — I5033 Acute on chronic diastolic (congestive) heart failure: Principal | ICD-10-CM

## 2014-03-16 LAB — COMPREHENSIVE METABOLIC PANEL
ALK PHOS: 132 U/L — AB (ref 39–117)
ALT: 41 U/L (ref 0–53)
AST: 26 U/L (ref 0–37)
Albumin: 2.7 g/dL — ABNORMAL LOW (ref 3.5–5.2)
Anion gap: 13 (ref 5–15)
BILIRUBIN TOTAL: 0.3 mg/dL (ref 0.3–1.2)
BUN: 48 mg/dL — ABNORMAL HIGH (ref 6–23)
CHLORIDE: 99 meq/L (ref 96–112)
CO2: 25 meq/L (ref 19–32)
Calcium: 8.4 mg/dL (ref 8.4–10.5)
Creatinine, Ser: 3.53 mg/dL — ABNORMAL HIGH (ref 0.50–1.35)
GFR, EST AFRICAN AMERICAN: 19 mL/min — AB (ref >90)
GFR, EST NON AFRICAN AMERICAN: 16 mL/min — AB (ref >90)
GLUCOSE: 188 mg/dL — AB (ref 70–99)
POTASSIUM: 3.4 meq/L — AB (ref 3.7–5.3)
SODIUM: 137 meq/L (ref 137–147)
Total Protein: 6.4 g/dL (ref 6.0–8.3)

## 2014-03-16 LAB — CBC
HCT: 33.1 % — ABNORMAL LOW (ref 39.0–52.0)
HEMOGLOBIN: 10.8 g/dL — AB (ref 13.0–17.0)
MCH: 26.5 pg (ref 26.0–34.0)
MCHC: 32.6 g/dL (ref 30.0–36.0)
MCV: 81.1 fL (ref 78.0–100.0)
Platelets: 225 10*3/uL (ref 150–400)
RBC: 4.08 MIL/uL — AB (ref 4.22–5.81)
RDW: 15.1 % (ref 11.5–15.5)
WBC: 3.6 10*3/uL — ABNORMAL LOW (ref 4.0–10.5)

## 2014-03-16 LAB — GLUCOSE, CAPILLARY
GLUCOSE-CAPILLARY: 228 mg/dL — AB (ref 70–99)
GLUCOSE-CAPILLARY: 168 mg/dL — AB (ref 70–99)
GLUCOSE-CAPILLARY: 109 mg/dL — AB (ref 70–99)
Glucose-Capillary: 153 mg/dL — ABNORMAL HIGH (ref 70–99)
Glucose-Capillary: 137 mg/dL — ABNORMAL HIGH (ref 70–99)
Glucose-Capillary: 40 mg/dL — CL (ref 70–99)

## 2014-03-16 LAB — PROTIME-INR
INR: 1.14 (ref 0.00–1.49)
Prothrombin Time: 14.7 seconds (ref 11.6–15.2)

## 2014-03-16 MED ORDER — INSULIN ASPART 100 UNIT/ML ~~LOC~~ SOLN
0.0000 [IU] | Freq: Every day | SUBCUTANEOUS | Status: DC
  Administered 2014-03-16: 2 [IU] via SUBCUTANEOUS

## 2014-03-16 MED ORDER — INSULIN ASPART PROT & ASPART (70-30 MIX) 100 UNIT/ML ~~LOC~~ SUSP
15.0000 [IU] | Freq: Two times a day (BID) | SUBCUTANEOUS | Status: DC
  Administered 2014-03-16 – 2014-03-17 (×3): 15 [IU] via SUBCUTANEOUS
  Filled 2014-03-16: qty 10

## 2014-03-16 MED ORDER — INSULIN ASPART 100 UNIT/ML ~~LOC~~ SOLN
0.0000 [IU] | Freq: Three times a day (TID) | SUBCUTANEOUS | Status: DC
  Administered 2014-03-16: 3 [IU] via SUBCUTANEOUS
  Administered 2014-03-16: 2 [IU] via SUBCUTANEOUS
  Administered 2014-03-16: 3 [IU] via SUBCUTANEOUS

## 2014-03-16 MED ORDER — ACETAMINOPHEN 325 MG PO TABS
650.0000 mg | ORAL_TABLET | Freq: Four times a day (QID) | ORAL | Status: DC | PRN
  Filled 2014-03-16: qty 2

## 2014-03-16 MED ORDER — SODIUM CHLORIDE 0.9 % IJ SOLN
3.0000 mL | Freq: Two times a day (BID) | INTRAMUSCULAR | Status: DC
  Administered 2014-03-16 – 2014-03-17 (×4): 3 mL via INTRAVENOUS

## 2014-03-16 MED ORDER — ISOSORBIDE MONONITRATE ER 30 MG PO TB24
30.0000 mg | ORAL_TABLET | Freq: Every day | ORAL | Status: DC
  Administered 2014-03-16: 30 mg via ORAL
  Filled 2014-03-16: qty 1

## 2014-03-16 MED ORDER — ACETAMINOPHEN 650 MG RE SUPP: 650.0000 mg | Freq: Four times a day (QID) | RECTAL | Status: DC | PRN

## 2014-03-16 MED ORDER — ONDANSETRON HCL 4 MG/2ML IJ SOLN: 4.0000 mg | Freq: Four times a day (QID) | INTRAMUSCULAR | Status: DC | PRN

## 2014-03-16 MED ORDER — INFLUENZA VAC SPLIT QUAD 0.5 ML IM SUSY
0.5000 mL | PREFILLED_SYRINGE | INTRAMUSCULAR | Status: AC
  Administered 2014-03-17: 0.5 mL via INTRAMUSCULAR
  Filled 2014-03-16: qty 0.5

## 2014-03-16 MED ORDER — HEPARIN SODIUM (PORCINE) 5000 UNIT/ML IJ SOLN
5000.0000 [IU] | Freq: Three times a day (TID) | INTRAMUSCULAR | Status: DC
  Administered 2014-03-16 – 2014-03-17 (×5): 5000 [IU] via SUBCUTANEOUS
  Filled 2014-03-16 (×6): qty 1

## 2014-03-16 MED ORDER — ONDANSETRON HCL 4 MG PO TABS: 4.0000 mg | ORAL_TABLET | Freq: Four times a day (QID) | ORAL | Status: DC | PRN

## 2014-03-16 MED ORDER — FUROSEMIDE 10 MG/ML IJ SOLN
40.0000 mg | Freq: Two times a day (BID) | INTRAMUSCULAR | Status: DC
  Administered 2014-03-16 – 2014-03-17 (×3): 40 mg via INTRAVENOUS
  Filled 2014-03-16 (×6): qty 4

## 2014-03-16 MED ORDER — HYDRALAZINE HCL 50 MG PO TABS
50.0000 mg | ORAL_TABLET | Freq: Three times a day (TID) | ORAL | Status: DC
  Administered 2014-03-16 – 2014-03-17 (×6): 50 mg via ORAL
  Filled 2014-03-16 (×8): qty 1

## 2014-03-16 MED ORDER — HYDRALAZINE HCL 20 MG/ML IJ SOLN: 10.0000 mg | INTRAMUSCULAR | Status: DC | PRN

## 2014-03-16 MED ORDER — AMLODIPINE BESYLATE 10 MG PO TABS
10.0000 mg | ORAL_TABLET | Freq: Every day | ORAL | Status: DC
  Administered 2014-03-16: 10 mg via ORAL
  Filled 2014-03-16: qty 1

## 2014-03-16 MED ORDER — ROSUVASTATIN CALCIUM 10 MG PO TABS
10.0000 mg | ORAL_TABLET | Freq: Two times a day (BID) | ORAL | Status: DC
  Administered 2014-03-16 – 2014-03-17 (×4): 10 mg via ORAL
  Filled 2014-03-16 (×5): qty 1

## 2014-03-16 MED ORDER — CARVEDILOL 25 MG PO TABS
25.0000 mg | ORAL_TABLET | Freq: Two times a day (BID) | ORAL | Status: DC
  Administered 2014-03-16 – 2014-03-17 (×3): 25 mg via ORAL
  Filled 2014-03-16 (×5): qty 1

## 2014-03-16 MED ORDER — ISOSORBIDE MONONITRATE ER 60 MG PO TB24
60.0000 mg | ORAL_TABLET | Freq: Every day | ORAL | Status: DC
  Administered 2014-03-17: 60 mg via ORAL
  Filled 2014-03-16: qty 1

## 2014-03-16 NOTE — Care Management Note (Unsigned)
    Page 1 of 1   03/16/2014     4:45:38 PM CARE MANAGEMENT NOTE 03/16/2014  Patient:  Michael Dorsey,Michael Dorsey   Account Number:  [id number]  Date Initiated:  03/16/2014  Documentation initiated by:  Mckinleigh Schuchart  Subjective/Objective Assessment:   Pt adm on 12/15 with SOB, pedal edema.  PTA, pt independent of ADLS.   [facility]     Action/Plan:   CM consult for medication needs.   Anticipated DC Date:  03/18/2014   Anticipated DC Plan:  HOME/SELF CARE      DC Planning Services  CM consult      Choice offered to / List presented to:             Status of service:  In process, will continue to follow Medicare Important Message given?   (If response is "NO", the following Medicare IM given date fields will be blank) Date Medicare IM given:   Medicare IM given by:   Date Additional Medicare IM given:   Additional Medicare IM given by:    Discharge Disposition:    Per UR Regulation:  Reviewed for med. necessity/level of care/duration of stay  If discussed at Long Length of Stay Meetings, dates discussed:    Comments:  03/16/14 Sidney AceJulie Adara Kittle, RN, BSN [phone] Pt states he does not have a financial issue with getting meds.  He does have an issue with transportation to pharmacy, and gets frustrated when pharmacy tells him he has to wait 2-3 days to get a Rx filled.  He has trouble keeping up with when they are all due to be filled. Will contact [facility] pharmacy to brainstorm ideas for a better schedule for him; ie, putting all refills on same schedule if possible.  Will follow up.

## 2014-03-16 NOTE — Progress Notes (Signed)
Patient arrived on unit from ED, BP 194/89.  Dr. Allena KatzPatel at bedside and aware.  Patient alert and oriented x4, daughter Nicole CellaDorothy at bedside with patient.  Will continue to monitor.

## 2014-03-16 NOTE — H&P (Signed)
[facility] History and Physical  Patient: Michael Dorsey  TKP:[id number]  DOB: 06-24-1945  DOS: the patient was seen and examined on 03/16/2014 PCP: Lorayne Marek, MD  Chief Complaint: Shortness of breath and pedal edema  HPI: Michael Dorsey is a 68 y.o. male with Past medical history of hypertension, diabetes mellitus, chronic kidney disease preparing to initiate dialysis, renal mass, chronic diastolic dysfunction. 2. The patient presents with progressively worsening shortness of breath as well as leg swelling. He mentions that since last one week he has been noticing increasing swelling of his legs. He was scheduled for surgery on his kidney to remove the renal mass and when he was found to have increasing leg swelling he was recommended to postpone the surgery and follow-up with his nephrologist. In the meantime he was also recommended to increase his dose of Lasix from twice a day to 3 times a day. He mentions he has been taking this dose since last 3 days despite which he continues to have progressively worsening dry cough as well as PND and orthopnea as well as leg swelling. He denies any nausea or vomiting denies any diarrhea or constipation denies any burning urination. Other than Lasix there is no change in his medication recently.  The patient is coming from home. And at his baseline independent for most of his ADL.  Review of Systems: as mentioned in the history of present illness.  A Comprehensive review of the other systems is negative.  Past Medical History  Diagnosis Date  . Hypertension   . Diabetes mellitus without complication   . Hypercholesteremia   . Renal disorder     in process of being diagnosed   Past Surgical History  Procedure Laterality Date  . Av fistula placement Left 11/10/2013    Procedure: LEFT ARM  ARTERIOVENOUS (AV) FISTULA CREATION;  Surgeon: Rosetta Posner, MD;  Location: [place];  Service: Vascular;  Laterality: Left;   Social  History:  reports that he has never smoked. He has never used smokeless tobacco. He reports that he does not drink alcohol or use illicit drugs.  No Known Allergies  History reviewed. No pertinent family history.  Prior to Admission medications   Medication Sig Start Date End Date Taking? Authorizing Provider  amLODipine (NORVASC) 10 MG tablet Take 1 tablet (10 mg total) by mouth daily. 11/11/13  Yes Bonnielee Haff, MD  carvedilol (COREG) 25 MG tablet Take 1 tablet (25 mg total) by mouth 2 (two) times daily with a meal. 11/11/13  Yes Bonnielee Haff, MD  furosemide (LASIX) 80 MG tablet Take 80 mg by mouth 3 (three) times daily. Take three times a day per wife   Yes Historical Provider, MD  hydrALAZINE (APRESOLINE) 50 MG tablet Take 1 tablet (50 mg total) by mouth every 8 (eight) hours. 11/11/13  Yes Bonnielee Haff, MD  insulin NPH-regular Human (NOVOLIN 70/30) (70-30) 100 UNIT/ML injection Inject 15 Units into the skin 2 (two) times daily with a meal.   Yes Historical Provider, MD  isosorbide mononitrate (IMDUR) 30 MG 24 hr tablet Take 1 tablet (30 mg total) by mouth daily. 11/11/13  Yes Bonnielee Haff, MD  rosuvastatin (CRESTOR) 10 MG tablet Take 1 tablet (10 mg total) by mouth 2 (two) times daily. 11/11/13  Yes Bonnielee Haff, MD  calcitRIOL (ROCALTROL) 0.25 MCG capsule Take 1 capsule (0.25 mcg total) by mouth daily. Patient not taking: Reported on 03/15/2014 11/11/13   Bonnielee Haff, MD  doxycycline (VIBRAMYCIN) 100 MG capsule Take 1 capsule (  100 mg total) by mouth 2 (two) times daily. Patient not taking: Reported on 03/15/2014 02/14/14   Melony Overly, MD  furosemide (LASIX) 80 MG tablet Take 2 tablets (160 mg total) by mouth 2 (two) times daily. Patient not taking: Reported on 03/15/2014 11/11/13   Bonnielee Haff, MD  GlucoCom Lancets MISC Check blood sugar TID & QHS 11/15/13   Lorayne Marek, MD  glucose blood (BAYER CONTOUR NEXT TEST) test strip 1 each by Other route as needed for other (blood  sugar). Use as instructed    Historical Provider, MD  glucose blood (CHOICE DM FORA G20 TEST STRIPS) test strip Use as instructed 11/15/13   Lorayne Marek, MD  glucose monitoring kit (FREESTYLE) monitoring kit 1 each by Does not apply route as needed for other. 11/15/13   Lorayne Marek, MD  Insulin Aspart Prot & Aspart (NOVOLOG MIX 70/30 FLEXPEN) (70-30) 100 UNIT/ML Pen Inject 15 Units into the skin 2 (two) times daily. Patient not taking: Reported on 03/15/2014 11/06/13   Harden Mo, MD  Lancet Device MISC 1 strip by Does not apply route daily as needed.     Historical Provider, MD  polyethylene glycol (MIRALAX / GLYCOLAX) packet Take 17 g by mouth 2 (two) times daily. Patient not taking: Reported on 03/15/2014 11/11/13   Bonnielee Haff, MD  potassium chloride SA (K-DUR,KLOR-CON) 20 MEQ tablet Take 2 tablets (40 mEq total) by mouth 2 (two) times daily. Patient not taking: Reported on 03/15/2014 11/11/13   Bonnielee Haff, MD  predniSONE (DELTASONE) 50 MG tablet Take 1 pill daily for 5 days. Patient not taking: Reported on 03/15/2014 02/14/14   Melony Overly, MD    Physical Exam: Filed Vitals:   03/16/14 0032 03/16/14 0106 03/16/14 0335 03/16/14 0507  BP: 194/89 189/88 153/71 142/73  Pulse: 85 80 77   Temp: 98.1 F (36.7 C)   98 F (36.7 C)  TempSrc: Oral   Oral  Resp: 24   20  Height: _0  (1.753 m)     Weight: 80.151 kg (176 lb 11.2 oz)     SpO2: 95%   95%    General: Alert, Awake and Oriented to Time, Place and Person. Appear in mild distress Eyes: PERRL ENT: Oral Mucosa clear moisty. Neck: Mild JVD Cardiovascular: S1 and S2 Present, no Murmur, Peripheral Pulses Present Respiratory: Bilateral Air entry equal and Decreased, Clear to Auscultation, noCrackles, no wheezes Abdomen: Bowel Sound present, Soft and non tender Skin: no Rash Extremities: Bilateral Pedal edema, no calf tenderness Neurologic: Grossly no focal neuro deficit.  Labs on Admission:  CBC:  Recent Labs Lab  03/15/14 1900  WBC 3.4*  HGB 11.1*  HCT 33.6*  MCV 81.0  PLT 226    CMP     Component Value Date/Time   NA 137 03/15/2014 1900   K 3.8 03/15/2014 1900   CL 99 03/15/2014 1900   CO2 26 03/15/2014 1900   GLUCOSE 235* 03/15/2014 1900   BUN 48* 03/15/2014 1900   CREATININE 3.67* 03/15/2014 1900   CREATININE 4.36* 11/24/2013 1240   CALCIUM 8.6 03/15/2014 1900   PROT 7.2 11/24/2013 1240   ALBUMIN 4.0 11/24/2013 1240   AST 16 11/24/2013 1240   ALT 20 11/24/2013 1240   ALKPHOS 70 11/24/2013 1240   BILITOT 0.4 11/24/2013 1240   GFRNONAA 15* 03/15/2014 1900   GFRNONAA 13* 11/24/2013 1240   GFRAA 18* 03/15/2014 1900   GFRAA 15* 11/24/2013 1240    No results for input(s): LIPASE,  AMYLASE in the last 168 hours. No results for input(s): AMMONIA in the last 168 hours.  No results for input(s): CKTOTAL, CKMB, CKMBINDEX, TROPONINI in the last 168 hours. BNP (last 3 results)  Recent Labs  11/06/13 1938 03/15/14 1900  PROBNP 1815.0* 4212.0*    Radiological Exams on Admission: Dg Chest 2 View  03/15/2014   CLINICAL DATA:  Initial evaluation for shortness of breath.  EXAM: CHEST  2 VIEW  COMPARISON:  Prior radiograph from 10/28/2013.  FINDINGS: Cardiomegaly is stable from prior study. Tortuosity intrathoracic aorta noted.  Lungs are mildly hypoinflated. There are extensive bibasilar patchy and linear opacities, which may reflect atelectasis or infiltrates. There is mild diffuse pulmonary vascular congestion without overt pulmonary edema. Minimal blunting of the right costophrenic angle suggests a small right pleural effusion. No pneumothorax.  No acute osseus abnormality.  IMPRESSION: 1. Bibasilar patchy and linear opacities. While these findings may reflect atelectasis, possible infectious infiltrates could be considered in the correct clinical setting. 2. Cardiomegaly with mild diffuse pulmonary vascular congestion without overt pulmonary edema. 3. Minimal blunting of the right  costophrenic angle, suggestive of tiny right pleural effusion.   Electronically Signed   By: Jeannine Boga M.D.   On: 03/15/2014 21:05    EKG: Independently reviewed. normal sinus rhythm, nonspecific ST and T waves changes.  Assessment/Plan Active Problems:   Pleural effusion, bilateral   DM II (diabetes mellitus, type II), controlled   Accelerated hypertension   Renal mass   End stage renal disease   Acute on chronic diastolic CHF (congestive heart failure)   1. Acute on chronic diastolic CHF (congestive heart failure) The patient is presenting with complaints of orthopnea and PND. He is found to have elevation of his serum BNp as well as pedal edema. Chest x-ray shows congestion and right-sided pleural effusion. With this he has received Lasix in the ER and has diuresed well. Continuing him on Lasix at present. Monitor him on telemetry.  2. Essential hypertension. Blood pressure mildly elevated. Currently continuing his home medication.  3. End-stage renal disease. Patient is following up with nephrology he already has a fistula in place but is not going for dialysis at present. Close monitoring of his serum BMP acquired.  4. Diabetes mellitus. Continuing patient's NPH and placing him on sliding scale.   Advance goals of care discussion: Full code   DVT Prophylaxis: subcutaneous Heparin Nutrition: Cardiac diabetic and renal diet  Disposition: Admitted to inpatient in telemetry unit.  Author: Berle Mull, MD [facility] Pager: [phone] 03/16/2014, 5:10 AM    If 7PM-7AM, please contact night-coverage [email or web address] Password [email or web address]

## 2014-03-16 NOTE — Progress Notes (Signed)
Echocardiogram 2D Echocardiogram has been performed.  Michael Dorsey, [name] M 03/16/2014, 1:19 PM

## 2014-03-16 NOTE — Progress Notes (Signed)
Patient ID: Michael BookmanJohn Dorsey  male  ZOX:[id number]    DOB: [birth year]    DOA: 03/15/2014  PCP: Doris CheadleADVANI, DEEPAK, MD  Brief history of present illness  Patient is a 68 year old male with hypertension, diabetes, CAD, preparing to initiate dialysis, renal mass, chronic diastolic dysfunction presented with progressively worsening shortness of breath and leg swelling. Patient reported that for last 1 week he had been noticing increasing swelling of his legs, he was scheduled for surgery on his kidney to remove the renal mass when he was found to have pedal edema and was recommended to postpone the surgery and follow-up with his nephrologist. He was recommended to increase the Lasix from twice a day to 3 times a day. Patient subsequently ran out of Lasix. He had not taken Lasix in the last 3 days.  Assessment/Plan: Principal Problem:   Acute on chronic diastolic CHF (congestive heart failure) - Symptoms improving after IV Lasix, has diuresed 1.9 L. BNP 4212 at the time of admission - 2-D echo in 8/15 showed EF of 55-60%, grade 2 diastolic dysfunction , mild to moderate pulmonary hypertension, small pericardial effusion.  - recheck 2-D echocardiogram to assess the pericardial effusion and any worsening in EF  - chest x-ray showed bibasilar patchy and linear opacities cardiomegaly with mild diffuse pulmonary vascular congestion without pulmonary edema.   Active Problems:   Pleural effusion, bilateral - Continue IV Lasix    DM II (diabetes mellitus, type II), UNcontrolled - Continue sliding scale insulin with NovoLog 70/30    Accelerated hypertension - BP somewhat better controlled today  - DC Norvasc due to #1, increased Imdur to 60 mg daily, continue Coreg, hydralazine and IV diuresis    Renal mass - Surgery currently postponed due to admission   CK D stage V not on hemodialysis yet - Creatinine function stable and improving  DVT Prophylaxis: heparin subcutaneous   Code Status: full  code   Family Communication:  Disposition: hopefully next 24-48 hours   Consultants: None  Procedures None  Antibiotics :  None    Subjective: Patient seen and examined, state now significantly improving after IV diuresis, denies any chest pain rated shortness of breath improving   Objective  Weight change:   Intake/Output Summary (Last 24 hours) at 03/16/14 1318 Last data filed at 03/16/14 1100  Gross per 24 hour  Intake    733 ml  Output   2675 ml  Net  -1942 ml   Blood pressure 142/73, pulse 77, temperature 98 F (36.7 C), temperature source Oral, resp. rate 20, height 5\' 9"  (1.753 m), weight 80.151 kg (176 lb 11.2 oz), SpO2 95 %.  Physical Exam: General: Alert and awake, oriented x3, not in any acute distress. HEENT: anicteric sclera, PERLA, EOMI CVS: S1-S2 clear, no murmur rubs or gallops ChDecreased breath sounds at the bases Abdomen: soft nontender, nondistended, normal bowel sounds  Extremities: no cyanosis, clubbing, + edema noted bilaterally Neuro: Cranial nerves II-XII intact, no focal neurological deficits  Lab Results: Basic Metabolic Panel:  Recent Labs Lab 03/15/14 1900 03/16/14 0440  NA 137 137  K 3.8 3.4*  CL 99 99  CO2 26 25  GLUCOSE 235* 188*  BUN 48* 48*  CREATININE 3.67* 3.53*  CALCIUM 8.6 8.4   Liver Function Tests:  Recent Labs Lab 03/16/14 0440  AST 26  ALT 41  ALKPHOS 132*  BILITOT 0.3  PROT 6.4  ALBUMIN 2.7*   No results for input(s): LIPASE, AMYLASE in the last 168 hours. No  results for input(s): AMMONIA in the last 168 hours. CBC:  Recent Labs Lab 03/15/14 1900 03/16/14 0440  WBC 3.4* 3.6*  HGB 11.1* 10.8*  HCT 33.6* 33.1*  MCV 81.0 81.1  PLT 226 225   Cardiac Enzymes: No results for input(s): CKTOTAL, CKMB, CKMBINDEX, TROPONINI in the last 168 hours. BNP: Invalid input(s): POCBNP CBG:  Recent Labs Lab 03/16/14 0114 03/16/14 0601 03/16/14 1128  GLUCAP 228* 153* 168*     Micro Results: No  results found for this or any previous visit (from the past 240 hour(s)).  Studies/Results: Dg Chest 2 View  03/15/2014   CLINICAL DATA:  Initial evaluation for shortness of breath.  EXAM: CHEST  2 VIEW  COMPARISON:  Prior radiograph from 10/28/2013.  FINDINGS: Cardiomegaly is stable from prior study. Tortuosity intrathoracic aorta noted.  Lungs are mildly hypoinflated. There are extensive bibasilar patchy and linear opacities, which may reflect atelectasis or infiltrates. There is mild diffuse pulmonary vascular congestion without overt pulmonary edema. Minimal blunting of the right costophrenic angle suggests a small right pleural effusion. No pneumothorax.  No acute osseus abnormality.  IMPRESSION: 1. Bibasilar patchy and linear opacities. While these findings may reflect atelectasis, possible infectious infiltrates could be considered in the correct clinical setting. 2. Cardiomegaly with mild diffuse pulmonary vascular congestion without overt pulmonary edema. 3. Minimal blunting of the right costophrenic angle, suggestive of tiny right pleural effusion.   Electronically Signed   By: Rise MuBenjamin  McClintock M.D.   On: 03/15/2014 21:05    Medications: Scheduled Meds: . amLODipine  10 mg Oral Daily  . carvedilol  25 mg Oral BID WC  . furosemide  40 mg Intravenous BID  . heparin  5,000 Units Subcutaneous 3 times per day  . hydrALAZINE  50 mg Oral 3 times per day  . [START ON 03/17/2014] Influenza vac split quadrivalent PF  0.5 mL Intramuscular Tomorrow-1000  . insulin aspart  0-15 Units Subcutaneous TID WC  . insulin aspart  0-5 Units Subcutaneous QHS  . insulin aspart protamine- aspart  15 Units Subcutaneous BID WC  . isosorbide mononitrate  30 mg Oral Daily  . rosuvastatin  10 mg Oral BID  . sodium chloride  3 mL Intravenous Q12H      LOS: 1 day   RAI,RIPUDEEP M.D. [facility] 03/16/2014, 1:18 PM Pager: [phone]  If 7PM-7AM, please contact  night-coverage [email or web address] Password [email or web address]

## 2014-03-17 LAB — CBC
HCT: 31 % — ABNORMAL LOW (ref 39.0–52.0)
Hemoglobin: 10.3 g/dL — ABNORMAL LOW (ref 13.0–17.0)
MCH: 27 pg (ref 26.0–34.0)
MCHC: 33.2 g/dL (ref 30.0–36.0)
MCV: 81.2 fL (ref 78.0–100.0)
Platelets: 211 10*3/uL (ref 150–400)
RBC: 3.82 MIL/uL — ABNORMAL LOW (ref 4.22–5.81)
RDW: 15.2 % (ref 11.5–15.5)
WBC: 3.6 10*3/uL — ABNORMAL LOW (ref 4.0–10.5)

## 2014-03-17 LAB — BASIC METABOLIC PANEL
ANION GAP: 13 (ref 5–15)
BUN: 47 mg/dL — AB (ref 6–23)
CALCIUM: 8.1 mg/dL — AB (ref 8.4–10.5)
CO2: 27 meq/L (ref 19–32)
CREATININE: 3.76 mg/dL — AB (ref 0.50–1.35)
Chloride: 103 mEq/L (ref 96–112)
GFR calc Af Amer: 17 mL/min — ABNORMAL LOW (ref >90)
GFR calc non Af Amer: 15 mL/min — ABNORMAL LOW (ref >90)
Glucose, Bld: 92 mg/dL (ref 70–99)
Potassium: 3.3 mEq/L — ABNORMAL LOW (ref 3.7–5.3)
Sodium: 143 mEq/L (ref 137–147)

## 2014-03-17 LAB — GLUCOSE, CAPILLARY
Glucose-Capillary: 120 mg/dL — ABNORMAL HIGH (ref 70–99)
Glucose-Capillary: 182 mg/dL — ABNORMAL HIGH (ref 70–99)
Glucose-Capillary: 36 mg/dL — CL (ref 70–99)
Glucose-Capillary: 58 mg/dL — ABNORMAL LOW (ref 70–99)
Glucose-Capillary: 88 mg/dL (ref 70–99)

## 2014-03-17 MED ORDER — FUROSEMIDE 80 MG PO TABS: 80.0000 mg | ORAL_TABLET | Freq: Two times a day (BID) | ORAL | Status: DC

## 2014-03-17 MED ORDER — FUROSEMIDE 80 MG PO TABS: 80.0000 mg | ORAL_TABLET | Freq: Three times a day (TID) | ORAL | Status: DC

## 2014-03-17 MED ORDER — FUROSEMIDE 80 MG PO TABS
80.0000 mg | ORAL_TABLET | Freq: Three times a day (TID) | ORAL | Status: DC
  Filled 2014-03-17 (×2): qty 1

## 2014-03-17 MED ORDER — POTASSIUM CHLORIDE CRYS ER 20 MEQ PO TBCR: 40.0000 meq | EXTENDED_RELEASE_TABLET | Freq: Two times a day (BID) | ORAL | Status: DC

## 2014-03-17 MED ORDER — ISOSORBIDE MONONITRATE ER 60 MG PO TB24: 60.0000 mg | ORAL_TABLET | Freq: Every day | ORAL | Status: DC

## 2014-03-17 MED ORDER — POTASSIUM CHLORIDE CRYS ER 20 MEQ PO TBCR
40.0000 meq | EXTENDED_RELEASE_TABLET | Freq: Every day | ORAL | Status: DC
  Administered 2014-03-17: 40 meq via ORAL
  Filled 2014-03-17: qty 2

## 2014-03-17 NOTE — Progress Notes (Signed)
Pharmacist HF Discharge Medication Education   Patient was educated on process for home medication reconciliation and provided written instructions as well. Patient was also counseled on HF medications and changes to be made upon arriving home. He was provided with the "Living Better with Heart Failure" patient education packet and a medication bag to be brought with him to his follow up clinic visits with his current medications.   Michael Dorsey, PharmD Clinical Pharmacist - Resident Pager: [phone] Pharmacy: [phone] 03/17/2014 1:44 PM

## 2014-03-17 NOTE — Discharge Summary (Signed)
Physician Discharge Summary  Patient ID: Michael Dorsey MRN: [id number] DOB/AGE: 08-03-1945 68 y.o.  Admit date: 03/15/2014 Discharge date: 03/17/2014  Primary Care Physician:  Lorayne Marek, MD  Discharge Diagnoses:    . Acute on chronic diastolic CHF (congestive heart failure) . Accelerated hypertension . CKD stage V, not on hemodialysis yet  . Pleural effusion, bilateral . Renal mass  Consults: None   Recommendations for Outpatient Follow-up:  Please note that patient may not need Lasix 80 mg 3 times a day, please decrease his Lasix dose to 80 mg twice a day upon your clinical evaluation at the follow-up appointment on 03/21/14.  Patient needs to follow up with outpatient nephrology  TESTS THAT NEED FOLLOW-UP BMET   DIET: Carb modified diet with low sodium    Allergies:  No Known Allergies   Discharge Medications:   Medication List    STOP taking these medications        amLODipine 10 MG tablet  Commonly known as:  NORVASC     doxycycline 100 MG capsule  Commonly known as:  VIBRAMYCIN     predniSONE 50 MG tablet  Commonly known as:  DELTASONE      TAKE these medications        calcitRIOL 0.25 MCG capsule  Commonly known as:  ROCALTROL  Take 1 capsule (0.25 mcg total) by mouth daily.     carvedilol 25 MG tablet  Commonly known as:  COREG  Take 1 tablet (25 mg total) by mouth 2 (two) times daily with a meal.     furosemide 80 MG tablet  Commonly known as:  LASIX  Take 1 tablet (80 mg total) by mouth 3 (three) times daily. Take three times a day per wife     GlucoCom Lancets Misc  Check blood sugar TID & QHS     BAYER CONTOUR NEXT TEST test strip  Generic drug:  glucose blood  1 each by Other route as needed for other (blood sugar). Use as instructed     glucose blood test strip  Commonly known as:  CHOICE DM FORA G20 TEST STRIPS  Use as instructed     glucose monitoring kit monitoring kit  1 each by Does not apply route as needed  for other.     hydrALAZINE 50 MG tablet  Commonly known as:  APRESOLINE  Take 1 tablet (50 mg total) by mouth every 8 (eight) hours.     Insulin Aspart Prot & Aspart (70-30) 100 UNIT/ML Pen  Commonly known as:  NOVOLOG MIX 70/30 FLEXPEN  Inject 15 Units into the skin 2 (two) times daily.     insulin NPH-regular Human (70-30) 100 UNIT/ML injection  Commonly known as:  NOVOLIN 70/30  Inject 15 Units into the skin 2 (two) times daily with a meal.     isosorbide mononitrate 60 MG 24 hr tablet  Commonly known as:  IMDUR  Take 1 tablet (60 mg total) by mouth daily.     Lancet Device Misc  1 strip by Does not apply route daily as needed.     polyethylene glycol packet  Commonly known as:  MIRALAX / GLYCOLAX  Take 17 g by mouth 2 (two) times daily.     potassium chloride SA 20 MEQ tablet  Commonly known as:  K-DUR,KLOR-CON  Take 2 tablets (40 mEq total) by mouth 2 (two) times daily.     rosuvastatin 10 MG tablet  Commonly known as:  CRESTOR  Take 1 tablet (10 mg  total) by mouth 2 (two) times daily.         Brief H and P: For complete details please refer to admission H and P, but in briefPatient is a 68 year old male with hypertension, diabetes, CAD, preparing to initiate dialysis, renal mass, chronic diastolic dysfunction presented with progressively worsening shortness of breath and leg swelling. Patient reported that for last 1 week he had been noticing increasing swelling of his legs, he was scheduled for surgery on his kidney to remove the renal mass when he was found to have pedal edema and was recommended to postpone the surgery and follow-up with his nephrologist. He was recommended to increase the Lasix from twice a day to 3 times a day. Patient subsequently ran out of Lasix. He had not taken Lasix in the last 3 days.   Hospital Course:  Acute on chronic diastolic CHF (congestive heart failure): Significantly improved, patient had run out of his Lasix. He was admitted and  started on IV diuresis, has diuresed 3.2 L since admission, weight down from 190 at admission to 170lbs. 2-D echo was done which showed EF of 55-60% with grade 2 diastolic dysfunction, moderate pulmonary hypertension. Pericardial effusion has resolved. Patient was taking oral Lasix 80 mg 3 times a day prior to admission and he had run out. I have resumed the Lasix 80 mg 3 times a day upon discharge however he may not need higher dose, please clinically evaluate at the time of follow-up and adjust Lasix dose. Patient was also strongly recommended to follow up with his outpatient nephrologist due to his chronic kidney disease, nearing hemodialysis after his urological surgery.  Active Problems:  Pleural effusion, bilateral Improved with diuresis   DM II (diabetes mellitus, type II), UNcontrolled - Continue sliding scale insulin with NovoLog 70/30   Accelerated hypertension - BP somewhat better controlled, Norvasc was discontinued due to acute on chronic diastolic CHF exacerbation, Imdur was increased, continue Coreg and hydralazine, Lasix   Renal mass - Urology Surgery currently postponed due to admission, patient will follow outpatient with urology.   CK D stage V not on hemodialysis yet - Creatinine function stable, baseline 3.5-4.3  Day of Discharge BP 154/72 mmHg  Pulse 82  Temp(Src) 98.2 F (36.8 C) (Oral)  Resp 20  Ht _0  (1.753 m)  Wt 77.3 kg (170 lb 6.7 oz)  BMI 25.15 kg/m2  SpO2 98%  Physical Exam: General: Alert and awake oriented x3 not in any acute distress. HEENT: anicteric sclera, pupils reactive to light and accommodation CVS: S1-S2 clear no murmur rubs or gallops Chest: clear to auscultation bilaterally, no wheezing rales or rhonchi Abdomen: soft nontender, nondistended, normal bowel sounds Extremities: no cyanosis, clubbing or edema noted bilaterally Neuro: Cranial nerves II-XII intact, no focal neurological deficits   The results of significant  diagnostics from this hospitalization (including imaging, microbiology, ancillary and laboratory) are listed below for reference.    LAB RESULTS: Basic Metabolic Panel:  Recent Labs Lab 03/16/14 0440 03/17/14 0545  NA 137 143  K 3.4* 3.3*  CL 99 103  CO2 25 27  GLUCOSE 188* 92  BUN 48* 47*  CREATININE 3.53* 3.76*  CALCIUM 8.4 8.1*   Liver Function Tests:  Recent Labs Lab 03/16/14 0440  AST 26  ALT 41  ALKPHOS 132*  BILITOT 0.3  PROT 6.4  ALBUMIN 2.7*   No results for input(s): LIPASE, AMYLASE in the last 168 hours. No results for input(s): AMMONIA in the last 168 hours. CBC:  Recent Labs Lab 03/16/14 0440 03/17/14 0545  WBC 3.6* 3.6*  HGB 10.8* 10.3*  HCT 33.1* 31.0*  MCV 81.1 81.2  PLT 225 211   Cardiac Enzymes: No results for input(s): CKTOTAL, CKMB, CKMBINDEX, TROPONINI in the last 168 hours. BNP: Invalid input(s): POCBNP CBG:  Recent Labs Lab 03/16/14 2313 03/17/14 0052  GLUCAP 109* 120*    Significant Diagnostic Studies:  Dg Chest 2 View  03/15/2014   CLINICAL DATA:  Initial evaluation for shortness of breath.  EXAM: CHEST  2 VIEW  COMPARISON:  Prior radiograph from 10/28/2013.  FINDINGS: Cardiomegaly is stable from prior study. Tortuosity intrathoracic aorta noted.  Lungs are mildly hypoinflated. There are extensive bibasilar patchy and linear opacities, which may reflect atelectasis or infiltrates. There is mild diffuse pulmonary vascular congestion without overt pulmonary edema. Minimal blunting of the right costophrenic angle suggests a small right pleural effusion. No pneumothorax.  No acute osseus abnormality.  IMPRESSION: 1. Bibasilar patchy and linear opacities. While these findings may reflect atelectasis, possible infectious infiltrates could be considered in the correct clinical setting. 2. Cardiomegaly with mild diffuse pulmonary vascular congestion without overt pulmonary edema. 3. Minimal blunting of the right costophrenic angle,  suggestive of tiny right pleural effusion.   Electronically Signed   By: Jeannine Boga M.D.   On: 03/15/2014 21:05    2D ECHO: Study Conclusions  - Left ventricle: The cavity size was normal. Wall thickness was increased in a pattern of mild LVH. Systolic function was normal. The estimated ejection fraction was in the range of 55% to 60%. Wall motion was normal; there were no regional wall motion abnormalities. Features are consistent with a pseudonormal left ventricular filling pattern, with concomitant abnormal relaxation and increased filling pressure (grade 2 diastolic dysfunction). - Left atrium: The atrium was moderately dilated. - Right ventricle: Systolic function was mildly reduced. - Pulmonary arteries: Systolic pressure was moderately increased. PA peak pressure: 53 mm Hg (S).  Disposition and Follow-up:     Discharge Instructions    (HEART FAILURE PATIENTS) Call MD:  Anytime you have any of the following symptoms: 1) 3 pound weight gain in 24 hours or 5 pounds in 1 week 2) shortness of breath, with or without a dry hacking cough 3) swelling in the hands, feet or stomach 4) if you have to sleep on extra pillows at night in order to breathe.    Complete by:  As directed      Diet Carb Modified    Complete by:  As directed      Discharge instructions    Complete by:  As directed   Please review all your medications and new changes.     Increase activity slowly    Complete by:  As directed             DISPOSITION: Home   DISCHARGE FOLLOW-UP Follow-up Information    Follow up with Chari Manning, NP On 03/21/2014.   Specialty:  Internal Medicine   Why:  at 2:30 PM, for hospital follow-up, obtain labs for BMET   Contact information:   [address] [id number] [phone]        Time spent on Discharge: 35 minutes  Signed:   Cherri Yera M.D. [facility] 03/17/2014, 10:28 AM Pager: [phone]

## 2014-03-17 NOTE — Progress Notes (Signed)
Discharge instuctions completed with patient and daughter. Medication lost reviewed. Verbalizes understanding.

## 2014-03-17 NOTE — Progress Notes (Signed)
Inpatient Diabetes Program Recommendations  AACE/ADA: New Consensus Statement on Inpatient Glycemic Control (2013)  Target Ranges:  Prepandial:   less than 140 mg/dL      Peak postprandial:   less than 180 mg/dL (1-2 hours)      Critically ill patients:  140 - 180 mg/dL  Results for Michael Dorsey, Michael Dorsey (MRN [id number]) as of 03/17/2014 14:33  Ref. Range 03/17/2014 00:52 03/17/2014 06:21 03/17/2014 12:37 03/17/2014 12:54 03/17/2014 14:23  Glucose-Capillary Latest Range: 70-99 mg/dL 409120 (H) 88 36 (LL) 58 (L) 182 (H)   Inpatient Diabetes Program Recommendations Insulin - Basal: consider decreasing 70/30 to 10 units  Thank you  Piedad ClimesGina Aarthi Uyeno BSN, RN,CDE Inpatient Diabetes Coordinator [phone] (team pager)

## 2014-03-17 NOTE — Progress Notes (Signed)
Blood sugar 36. Pt asymptomatic. Orange juice and crackers given. Lunch tray came up shortly after. Pt currently eating lunch. Md Rai notified.

## 2014-03-21 ENCOUNTER — Encounter: Payer: Self-pay | Admitting: Internal Medicine

## 2014-03-21 ENCOUNTER — Ambulatory Visit: Payer: No Typology Code available for payment source | Attending: Internal Medicine | Admitting: Internal Medicine

## 2014-03-21 VITALS — BP 161/72 | HR 72 | Temp 98.4°F | Resp 18 | Ht 69.0 in | Wt 174.0 lb

## 2014-03-21 DIAGNOSIS — E780 Pure hypercholesterolemia: Secondary | ICD-10-CM | POA: Insufficient documentation

## 2014-03-21 DIAGNOSIS — I5033 Acute on chronic diastolic (congestive) heart failure: Secondary | ICD-10-CM | POA: Insufficient documentation

## 2014-03-21 DIAGNOSIS — E119 Type 2 diabetes mellitus without complications: Secondary | ICD-10-CM | POA: Insufficient documentation

## 2014-03-21 DIAGNOSIS — N186 End stage renal disease: Secondary | ICD-10-CM

## 2014-03-21 DIAGNOSIS — Z794 Long term (current) use of insulin: Secondary | ICD-10-CM | POA: Insufficient documentation

## 2014-03-21 DIAGNOSIS — N2889 Other specified disorders of kidney and ureter: Secondary | ICD-10-CM

## 2014-03-21 DIAGNOSIS — I151 Hypertension secondary to other renal disorders: Secondary | ICD-10-CM

## 2014-03-21 DIAGNOSIS — I120 Hypertensive chronic kidney disease with stage 5 chronic kidney disease or end stage renal disease: Secondary | ICD-10-CM | POA: Insufficient documentation

## 2014-03-21 LAB — POCT GLYCOSYLATED HEMOGLOBIN (HGB A1C): Hemoglobin A1C: 8.1

## 2014-03-21 LAB — COMPREHENSIVE METABOLIC PANEL
ALBUMIN: 3.1 g/dL — AB (ref 3.5–5.2)
ALT: 26 U/L (ref 0–53)
AST: 21 U/L (ref 0–37)
Alkaline Phosphatase: 109 U/L (ref 39–117)
BILIRUBIN TOTAL: 0.4 mg/dL (ref 0.2–1.2)
BUN: 42 mg/dL — ABNORMAL HIGH (ref 6–23)
CALCIUM: 8.4 mg/dL (ref 8.4–10.5)
CHLORIDE: 101 meq/L (ref 96–112)
CO2: 27 mEq/L (ref 19–32)
Creat: 3.72 mg/dL — ABNORMAL HIGH (ref 0.50–1.35)
Glucose, Bld: 120 mg/dL — ABNORMAL HIGH (ref 70–99)
POTASSIUM: 4.5 meq/L (ref 3.5–5.3)
SODIUM: 136 meq/L (ref 135–145)
TOTAL PROTEIN: 6.1 g/dL (ref 6.0–8.3)

## 2014-03-21 LAB — GLUCOSE, POCT (MANUAL RESULT ENTRY): POC GLUCOSE: 156 mg/dL — AB (ref 70–99)

## 2014-03-21 MED ORDER — ISOSORBIDE MONONITRATE ER 60 MG PO TB24: 90.0000 mg | ORAL_TABLET | Freq: Every day | ORAL | Status: DC

## 2014-03-21 MED ORDER — CALCITRIOL 0.25 MCG PO CAPS: 0.2500 ug | ORAL_CAPSULE | Freq: Every day | ORAL | Status: DC

## 2014-03-21 NOTE — Progress Notes (Signed)
Hospital follow for CHF patient discharged last week. Patient unable to have kidney surgery due to edema Need referral to urologist/nephrologist

## 2014-03-21 NOTE — Progress Notes (Signed)
Patient ID: Michael Dorsey, male   DOB: 22-Sep-1945, 68 y.o.   MRN: [id number]  CC: Hospital follow-up  HPI: Michael Dorsey is a 68 y.o. male here today for a follow up visit.  Patient has past medical history of HTN, T2DM, HLD, CHF, ESRD, and renal mass.  Patient reports that he was due to follow up with urology for a right renal mass removal but was unable to have the surgery due to severe edema.  Patient has had left arm fistula placed and will possibly begin dialysis once the right kidney is removed.   Patient reports that he was unable to get in with [facility] due to not having insurance, so he went to [facility].  The patient has been to see a Nephrologist in [facility] but was told that since he has the [facility] card he will need to return to [facility] to have any procedures done.  Today he is requesting a referral to urology and nephrology here in [facility]   Patient was recently hospitalized last week for CHF.  He is currently taking lasix 80 mg TID.  He has noticed some improvement in BLE edema, but has not been weighing himself daily because he does not own a scale. Upon review of charts patient has picked up 4 pounds since hospital discharge on 12/17.     No Known Allergies Past Medical History  Diagnosis Date  . Hypertension   . Diabetes mellitus without complication   . Hypercholesteremia   . Renal disorder     in process of being diagnosed   Current Outpatient Prescriptions on File Prior to Visit  Medication Sig Dispense Refill  . calcitRIOL (ROCALTROL) 0.25 MCG capsule Take 1 capsule (0.25 mcg total) by mouth daily. 30 capsule 1  . carvedilol (COREG) 25 MG tablet Take 1 tablet (25 mg total) by mouth 2 (two) times daily with a meal. 60 tablet 3  . furosemide (LASIX) 80 MG tablet Take 1 tablet (80 mg total) by mouth 3 (three) times daily. Take three times a day per wife 90 tablet 3  . GlucoCom Lancets MISC Check blood sugar TID & QHS 100 each  4  . glucose blood (BAYER CONTOUR NEXT TEST) test strip 1 each by Other route as needed for other (blood sugar). Use as instructed    . glucose blood (CHOICE DM FORA G20 TEST STRIPS) test strip Use as instructed 100 each 12  . glucose monitoring kit (FREESTYLE) monitoring kit 1 each by Does not apply route as needed for other. 1 each 0  . hydrALAZINE (APRESOLINE) 50 MG tablet Take 1 tablet (50 mg total) by mouth every 8 (eight) hours. 90 tablet 2  . insulin NPH-regular Human (NOVOLIN 70/30) (70-30) 100 UNIT/ML injection Inject 15 Units into the skin 2 (two) times daily with a meal.    . isosorbide mononitrate (IMDUR) 60 MG 24 hr tablet Take 1 tablet (60 mg total) by mouth daily. 30 tablet 4  . Lancet Device MISC 1 strip by Does not apply route daily as needed.     . potassium chloride SA (K-DUR,KLOR-CON) 20 MEQ tablet Take 2 tablets (40 mEq total) by mouth 2 (two) times daily. 60 tablet 2  . rosuvastatin (CRESTOR) 10 MG tablet Take 1 tablet (10 mg total) by mouth 2 (two) times daily. 30 tablet 2  . polyethylene glycol (MIRALAX / GLYCOLAX) packet Take 17 g by mouth 2 (two) times daily. (Patient not taking: Reported on 03/15/2014) 14 each  0   No current facility-administered medications on file prior to visit.   History reviewed. No pertinent family history. History   Social History  . Marital Status: Married    Spouse Name: N/A    Number of Children: N/A  . Years of Education: N/A   Occupational History  . Not on file.   Social History Main Topics  . Smoking status: Never Smoker   . Smokeless tobacco: Never Used  . Alcohol Use: No  . Drug Use: No  . Sexual Activity: Not Currently   Other Topics Concern  . Not on file   Social History Narrative    Review of Systems  Respiratory: Negative for cough and shortness of breath.   Cardiovascular: Positive for leg swelling. Negative for chest pain, palpitations, orthopnea, claudication and PND.  All other systems reviewed and are  negative.      Objective:   Filed Vitals:   03/21/14 1427  BP: 161/72  Pulse: 72  Temp: 98.4 F (36.9 C)  Resp: 18    Physical Exam  Neck: No thyromegaly present.  Cardiovascular: Normal rate, regular rhythm and normal heart sounds.   Pulmonary/Chest: Effort normal and breath sounds normal. He has no rales.  Abdominal: Soft. Bowel sounds are normal. He exhibits no distension.  Musculoskeletal: He exhibits edema (right>left).  Lymphadenopathy:    He has no cervical adenopathy.  Skin: Skin is warm and dry.     Lab Results  Component Value Date   WBC 3.6* 03/17/2014   HGB 10.3* 03/17/2014   HCT 31.0* 03/17/2014   MCV 81.2 03/17/2014   PLT 211 03/17/2014   Lab Results  Component Value Date   CREATININE 3.76* 03/17/2014   BUN 47* 03/17/2014   NA 143 03/17/2014   K 3.3* 03/17/2014   CL 103 03/17/2014   CO2 27 03/17/2014    Lab Results  Component Value Date   HGBA1C 8.1 03/21/2014   Lipid Panel  No results found for: CHOL, TRIG, HDL, CHOLHDL, VLDL, LDLCALC     Assessment and plan:   Virat was seen today for hospitalization follow-up.  Diagnoses and associated orders for this visit:  DM II (diabetes mellitus, type II), controlled - Glucose (CBG) - POCT glycosylated hemoglobin (Hb A1C) Patients diabetes is control as evidence by low a1c.  Patient will continue with current therapy and continue to make necessary lifestyle changes.  Reviewed foot care, diet, exercise, annual health maintenance with patient.   Acute on chronic diastolic CHF (congestive heart failure) - Ambulatory referral to Cardiology  Hypertension secondary to other renal disorders - Increased to isosorbide mononitrate (IMDUR) 60 MG 24 hr tablet; Take 1.5 tablets (90 mg total) by mouth daily. Previously on 60 mg daily, consulted with Dr. Annitta Needs.   End stage renal disease - Ambulatory referral to Nephrology--for fistula and HD maintenance - Comprehensive metabolic panel - calcitRIOL  (ROCALTROL) 0.25 MCG capsule; Take 1 capsule (0.25 mcg total) by mouth daily.  Renal mass - Ambulatory referral to Urology---for mass removal   Will have pt come back in 1 week with RN for BP check and weight check, she will at that time address DASH diet.  Will come back in 2 weeks with PCP for deciding on lasix dose (will decrease to BID if appropriate)      Chari Manning, [facility] [phone] 03/21/2014, 2:55 PM

## 2014-04-07 ENCOUNTER — Ambulatory Visit: Payer: No Typology Code available for payment source | Attending: Internal Medicine | Admitting: *Deleted

## 2014-04-07 VITALS — BP 156/84 | HR 75 | Temp 98.8°F | Resp 20 | Wt 168.0 lb

## 2014-04-07 DIAGNOSIS — E119 Type 2 diabetes mellitus without complications: Secondary | ICD-10-CM | POA: Insufficient documentation

## 2014-04-07 DIAGNOSIS — I151 Hypertension secondary to other renal disorders: Secondary | ICD-10-CM

## 2014-04-07 DIAGNOSIS — N2889 Other specified disorders of kidney and ureter: Secondary | ICD-10-CM

## 2014-04-07 DIAGNOSIS — I120 Hypertensive chronic kidney disease with stage 5 chronic kidney disease or end stage renal disease: Secondary | ICD-10-CM | POA: Insufficient documentation

## 2014-04-07 DIAGNOSIS — Z794 Long term (current) use of insulin: Secondary | ICD-10-CM | POA: Insufficient documentation

## 2014-04-07 NOTE — Progress Notes (Signed)
Patient presents with daughter for BP check Med list reviewed; states taking all meds as directed Left arm fistula present States wife cooks with less salt but still uses some in cooking Discussed need for low sodium diet and Mrs. Dash as an alternative to salt  BP 156/84 Right arm  P 75 R  20 T  98.8 oral SPO2  97%  Wt 168lb No lower extremity edema noted  Patient given literature on the DASH Eating Plan Patient will follow up with PCP at first available appt as PCP wanted to see pt 2 weeks from last OV

## 2014-04-07 NOTE — Patient Instructions (Signed)
DASH Eating Plan °DASH stands for "Dietary Approaches to Stop Hypertension." The DASH eating plan is a healthy eating plan that has been shown to reduce high blood pressure (hypertension). Additional health benefits may include reducing the risk of type 2 diabetes mellitus, heart disease, and stroke. The DASH eating plan may also help with weight loss. °WHAT DO I NEED TO KNOW ABOUT THE DASH EATING PLAN? °For the DASH eating plan, you will follow these general guidelines: °· Choose foods with a percent daily value for sodium of less than 5% (as listed on the food label). °· Use salt-free seasonings or herbs instead of table salt or sea salt. °· Check with your health care provider or pharmacist before using salt substitutes. °· Eat lower-sodium products, often labeled as "lower sodium" or "no salt added." °· Eat fresh foods. °· Eat more vegetables, fruits, and low-fat dairy products. °· Choose whole grains. Look for the word "whole" as the first word in the ingredient list. °· Choose fish and skinless chicken or turkey more often than red meat. Limit fish, poultry, and meat to 6 oz (170 g) each day. °· Limit sweets, desserts, sugars, and sugary drinks. °· Choose heart-healthy fats. °· Limit cheese to 1 oz (28 g) per day. °· Eat more home-cooked food and less restaurant, buffet, and fast food. °· Limit fried foods. °· Cook foods using methods other than frying. °· Limit canned vegetables. If you do use them, rinse them well to decrease the sodium. °· When eating at a restaurant, ask that your food be prepared with less salt, or no salt if possible. °WHAT FOODS CAN I EAT? °Seek help from a dietitian for individual calorie needs. °Grains °Whole grain or whole wheat bread. Brown rice. Whole grain or whole wheat pasta. Quinoa, bulgur, and whole grain cereals. Low-sodium cereals. Corn or whole wheat flour tortillas. Whole grain cornbread. Whole grain crackers. Low-sodium crackers. °Vegetables °Fresh or frozen vegetables  (raw, steamed, roasted, or grilled). Low-sodium or reduced-sodium tomato and vegetable juices. Low-sodium or reduced-sodium tomato sauce and paste. Low-sodium or reduced-sodium canned vegetables.  °Fruits °All fresh, canned (in natural juice), or frozen fruits. °Meat and Other Protein Products °Ground beef (85% or leaner), grass-fed beef, or beef trimmed of fat. Skinless chicken or turkey. Ground chicken or turkey. Pork trimmed of fat. All fish and seafood. Eggs. Dried beans, peas, or lentils. Unsalted nuts and seeds. Unsalted canned beans. °Dairy °Low-fat dairy products, such as skim or 1% milk, 2% or reduced-fat cheeses, low-fat ricotta or cottage cheese, or plain low-fat yogurt. Low-sodium or reduced-sodium cheeses. °Fats and Oils °Tub margarines without trans fats. Light or reduced-fat mayonnaise and salad dressings (reduced sodium). Avocado. Safflower, olive, or canola oils. Natural peanut or almond butter. °Other °Unsalted popcorn and pretzels. °The items listed above may not be a complete list of recommended foods or beverages. Contact your dietitian for more options. °WHAT FOODS ARE NOT RECOMMENDED? °Grains °White bread. White pasta. White rice. Refined cornbread. Bagels and croissants. Crackers that contain trans fat. °Vegetables °Creamed or fried vegetables. Vegetables in a cheese sauce. Regular canned vegetables. Regular canned tomato sauce and paste. Regular tomato and vegetable juices. °Fruits °Dried fruits. Canned fruit in light or heavy syrup. Fruit juice. °Meat and Other Protein Products °Fatty cuts of meat. Ribs, chicken wings, bacon, sausage, bologna, salami, chitterlings, fatback, hot dogs, bratwurst, and packaged luncheon meats. Salted nuts and seeds. Canned beans with salt. °Dairy °Whole or 2% milk, cream, half-and-half, and cream cheese. Whole-fat or sweetened yogurt. Full-fat   cheeses or blue cheese. Nondairy creamers and whipped toppings. Processed cheese, cheese spreads, or cheese  curds. °Condiments °Onion and garlic salt, seasoned salt, table salt, and sea salt. Canned and packaged gravies. Worcestershire sauce. Tartar sauce. Barbecue sauce. Teriyaki sauce. Soy sauce, including reduced sodium. Steak sauce. Fish sauce. Oyster sauce. Cocktail sauce. Horseradish. Ketchup and mustard. Meat flavorings and tenderizers. Bouillon cubes. Hot sauce. Tabasco sauce. Marinades. Taco seasonings. Relishes. °Fats and Oils °Butter, stick margarine, lard, shortening, ghee, and bacon fat. Coconut, palm kernel, or palm oils. Regular salad dressings. °Other °Pickles and olives. Salted popcorn and pretzels. °The items listed above may not be a complete list of foods and beverages to avoid. Contact your dietitian for more information. °WHERE CAN I FIND MORE INFORMATION? °National Heart, Lung, and [employer] Institute: www.nhlbi.nih.gov/health/health-topics/topics/dash/ °Document Released: 03/07/2011 Document Revised: 08/02/2013 Document Reviewed: 01/20/2013 °ExitCare® Patient Information ©2015 [employer]. This information is not intended to replace advice given to you by your health care provider. Make sure you discuss any questions you have with your health care provider. ° °

## 2014-04-25 ENCOUNTER — Ambulatory Visit: Payer: Self-pay | Admitting: Internal Medicine

## 2014-05-06 ENCOUNTER — Ambulatory Visit: Payer: Self-pay | Admitting: Cardiovascular Disease

## 2014-05-09 ENCOUNTER — Ambulatory Visit: Payer: Self-pay | Attending: Internal Medicine

## 2014-05-09 ENCOUNTER — Other Ambulatory Visit: Payer: Self-pay | Admitting: Internal Medicine

## 2014-05-10 ENCOUNTER — Ambulatory Visit (INDEPENDENT_AMBULATORY_CARE_PROVIDER_SITE_OTHER): Payer: No Typology Code available for payment source | Admitting: Cardiovascular Disease

## 2014-05-10 ENCOUNTER — Encounter: Payer: Self-pay | Admitting: Cardiovascular Disease

## 2014-05-10 VITALS — BP 120/60 | HR 72 | Ht 69.0 in | Wt 166.2 lb

## 2014-05-10 DIAGNOSIS — E785 Hyperlipidemia, unspecified: Secondary | ICD-10-CM

## 2014-05-10 DIAGNOSIS — I5033 Acute on chronic diastolic (congestive) heart failure: Secondary | ICD-10-CM

## 2014-05-10 DIAGNOSIS — Z01818 Encounter for other preprocedural examination: Secondary | ICD-10-CM

## 2014-05-10 NOTE — Assessment & Plan Note (Signed)
History of hypertension blood pressure measured at 120/60. He is on hydralazine and isosorbide. He is also on carvedilol. Continue current meds at current dosing

## 2014-05-10 NOTE — Patient Instructions (Addendum)
Lexiscan Myoview- this is a test that looks at the blood flow to your heart muscle.  It takes approximately 2 1/2 hours. Please follow instruction sheet, as given.  We request that you follow-up in: 6 months with an extender and in 12 months with Dr Allyson SabalBerry.  You will receive a reminder letter in the mail two months in advance. If you don't receive a letter, please call our office to schedule the follow-up appointment.

## 2014-05-10 NOTE — Progress Notes (Signed)
05/10/2014 Michael Dorsey   11-17-1945  [id number]  Primary Physician Lorayne Marek, MD Primary Cardiologist: Lorretta Harp MD Renae Gloss   HPI:  Michael Dorsey is a 69 year old thin-appearing married African-American male father of 97, grandfather and grandchildren who is accompanied by his daughter today. He was previously a patient of Dr. Einar Dorsey. History of hypertension and hyperlipidemia. He has never had a heart attack or stroke. He was admitted in late December with congestive heart failure. He has normal LV function with grade 2 diastolic dysfunction. He also has chronic renal insufficiency. Dr. Donnetta Dorsey placed a dialysis graft in August which he began to use 2 weeks ago.   Current Outpatient Prescriptions  Medication Sig Dispense Refill  . calcitRIOL (ROCALTROL) 0.25 MCG capsule Take 1 capsule (0.25 mcg total) by mouth daily. 30 capsule 1  . carvedilol (COREG) 25 MG tablet Take 1 tablet (25 mg total) by mouth 2 (two) times daily with a meal. 60 tablet 3  . furosemide (LASIX) 80 MG tablet Take 1 tablet (80 mg total) by mouth 3 (three) times daily. Take three times a day per wife 90 tablet 3  . GlucoCom Lancets MISC Check blood sugar TID & QHS 100 each 4  . glucose blood (BAYER CONTOUR NEXT TEST) test strip 1 each by Other route as needed for other (blood sugar). Use as instructed    . glucose blood (CHOICE DM FORA G20 TEST STRIPS) test strip Use as instructed 100 each 12  . glucose monitoring kit (FREESTYLE) monitoring kit 1 each by Does not apply route as needed for other. 1 each 0  . hydrALAZINE (APRESOLINE) 50 MG tablet Take 1 tablet (50 mg total) by mouth every 8 (eight) hours. 90 tablet 2  . insulin NPH-regular Human (NOVOLIN 70/30) (70-30) 100 UNIT/ML injection Inject 15 Units into the skin 2 (two) times daily with a meal.    . isosorbide mononitrate (IMDUR) 60 MG 24 hr tablet Take 1.5 tablets (90 mg total) by mouth daily. 30 tablet 4  . Lancet Device MISC  1 strip by Does not apply route daily as needed.     . polyethylene glycol (MIRALAX / GLYCOLAX) packet Take 17 g by mouth 2 (two) times daily. 14 each 0  . potassium chloride SA (K-DUR,KLOR-CON) 20 MEQ tablet Take 2 tablets (40 mEq total) by mouth 2 (two) times daily. 60 tablet 2  . rosuvastatin (CRESTOR) 10 MG tablet Take 1 tablet (10 mg total) by mouth 2 (two) times daily. 30 tablet 2   No current facility-administered medications for this visit.    No Known Allergies  History   Social History  . Marital Status: Married    Spouse Name: N/A    Number of Children: N/A  . Years of Education: N/A   Occupational History  . Not on file.   Social History Main Topics  . Smoking status: Never Smoker   . Smokeless tobacco: Never Used  . Alcohol Use: No  . Drug Use: No  . Sexual Activity: Not Currently   Other Topics Concern  . Not on file   Social History Narrative     Review of Systems: General: negative for chills, fever, night sweats or weight changes.  Cardiovascular: negative for chest pain, dyspnea on exertion, edema, orthopnea, palpitations, paroxysmal nocturnal dyspnea or shortness of breath Dermatological: negative for rash Respiratory: negative for cough or wheezing Urologic: negative for hematuria Abdominal: negative for nausea, vomiting, diarrhea, bright red blood per rectum, melena, or  hematemesis Neurologic: negative for visual changes, syncope, or dizziness All other systems reviewed and are otherwise negative except as noted above.    Blood pressure 120/60, pulse 72, height _0  (1.753 m), weight 166 lb 3.2 oz (75.388 kg).  General appearance: alert and no distress Neck: no adenopathy, no JVD, supple, symmetrical, trachea midline, thyroid not enlarged, symmetric, no tenderness/mass/nodules and soft left carotid bruit probably related to his AV fistula Lungs: clear to auscultation bilaterally Heart: regular rate and rhythm, S1, S2 normal, no murmur, click,  rub or gallop Extremities: extremities normal, atraumatic, no cyanosis or edema  EKG not performed today  ASSESSMENT AND PLAN:   Hypertension, renal disease History of hypertension blood pressure measured at 120/60. He is on hydralazine and isosorbide. He is also on carvedilol. Continue current meds at current dosing   Hyperlipidemia On statin therapy followed by his PCP   Acute on chronic diastolic CHF (congestive heart failure) The patient was recently admitted with congestive heart failure 03/15/14 through the 17th. He had normal LV function with grade 2 diastolic dysfunction. He was diuresed. He ultimately was started on hemodialysis several weeks ago. He denies chest pain or shortness of breath.       Lorretta Harp MD FACP,FACC,FAHA, [place] 05/10/2014 12:00 PM

## 2014-05-10 NOTE — Assessment & Plan Note (Signed)
The patient was recently admitted with congestive heart failure 03/15/14 through the 17th. He had normal LV function with grade 2 diastolic dysfunction. He was diuresed. He ultimately was started on hemodialysis several weeks ago. He denies chest pain or shortness of breath.

## 2014-05-10 NOTE — Assessment & Plan Note (Signed)
On statin therapy followed by his PCP 

## 2014-05-12 ENCOUNTER — Telehealth (HOSPITAL_COMMUNITY): Payer: Self-pay

## 2014-05-12 NOTE — Telephone Encounter (Signed)
Encounter complete. 

## 2014-05-17 ENCOUNTER — Other Ambulatory Visit: Payer: Self-pay | Admitting: Emergency Medicine

## 2014-05-17 ENCOUNTER — Ambulatory Visit (HOSPITAL_COMMUNITY)
Admission: RE | Admit: 2014-05-17 | Discharge: 2014-05-17 | Disposition: A | Discharge status: Home / Self Care | Payer: Self-pay | Source: Ambulatory Visit | Attending: Internal Medicine | Admitting: Internal Medicine

## 2014-05-17 ENCOUNTER — Telehealth: Payer: Self-pay | Admitting: Internal Medicine

## 2014-05-17 DIAGNOSIS — Z01818 Encounter for other preprocedural examination: Secondary | ICD-10-CM

## 2014-05-17 DIAGNOSIS — Z794 Long term (current) use of insulin: Secondary | ICD-10-CM | POA: Insufficient documentation

## 2014-05-17 DIAGNOSIS — E119 Type 2 diabetes mellitus without complications: Secondary | ICD-10-CM | POA: Insufficient documentation

## 2014-05-17 DIAGNOSIS — E785 Hyperlipidemia, unspecified: Secondary | ICD-10-CM | POA: Insufficient documentation

## 2014-05-17 DIAGNOSIS — I10 Essential (primary) hypertension: Secondary | ICD-10-CM | POA: Insufficient documentation

## 2014-05-17 DIAGNOSIS — R0609 Other forms of dyspnea: Secondary | ICD-10-CM | POA: Insufficient documentation

## 2014-05-17 MED ORDER — HYDRALAZINE HCL 50 MG PO TABS: 50.0000 mg | ORAL_TABLET | Freq: Three times a day (TID) | ORAL | Status: DC

## 2014-05-17 MED ORDER — TECHNETIUM TC 99M SESTAMIBI GENERIC - CARDIOLITE
10.8000 | Freq: Once | INTRAVENOUS | Status: AC | PRN
  Administered 2014-05-17: 11 via INTRAVENOUS

## 2014-05-17 MED ORDER — TECHNETIUM TC 99M SESTAMIBI GENERIC - CARDIOLITE
29.4000 | Freq: Once | INTRAVENOUS | Status: AC | PRN
  Administered 2014-05-17: 29 via INTRAVENOUS

## 2014-05-17 MED ORDER — REGADENOSON 0.4 MG/5ML IV SOLN
0.4000 mg | Freq: Once | INTRAVENOUS | Status: AC
  Administered 2014-05-17: 0.4 mg via INTRAVENOUS

## 2014-05-17 MED ORDER — CARVEDILOL 25 MG PO TABS: 25.0000 mg | ORAL_TABLET | Freq: Two times a day (BID) | ORAL | Status: DC

## 2014-05-17 NOTE — Telephone Encounter (Signed)
[facility] anesthesiologist calling to request refill of pt needs refill on hydralazine and potassium on behalf of patient.  Pt has an upcoming surgery scheduled, for any question please f/u with caller or with pt.

## 2014-05-17 NOTE — Procedures (Addendum)
[facility] [address] [place] [address] [phone]  Cardiology Nuclear Med Study  Michael BookmanJohn Dorsey is a 69 y.o. male     MRN : [id number]     DOB: Feb 20, 1946  Procedure Date: 05/17/2014  Nuclear Med Background Indication for Stress Test:  Evaluation for Ischemia History:  CHF;Chronic renal Insuff.;Grade II Diastolic Dysfunction;Normal LVF; Cardiac Risk Factors: Hypertension, IDDM Type 2 and Lipids  Symptoms:  DOE and SOB   Nuclear Pre-Procedure Caffeine/Decaff Intake:  7:00pm NPO After: 5:00am   IV Site: R Forearm  IV 0.9% NS with Angio Cath:  22g  Chest Size (in):  44" IV Started by: Berdie OgrenAmanda Wease, RN  Height: 5\' 9"  (1.753 m)  Cup Size: n/a  BMI:  Body mass index is 24.5 kg/(m^2). Weight:  166 lb (75.297 kg)   Tech Comments:  n/a    Nuclear Med Study 1 or 2 day study: 1 day  Stress Test Type:  Lexiscan  Order Authorizing Provider:  Nanetta BattyJonathan Chancellor Vanderloop, MD   Resting Radionuclide: Technetium 6086m Sestamibi  Resting Radionuclide Dose: 10.8 mCi   Stress Radionuclide:  Technetium 5986m Sestamibi  Stress Radionuclide Dose: 29.4 mCi           Stress Protocol Rest HR: 74 Stress HR: 96  Rest BP: 174/99 Stress BP: 174/99  Exercise Time (min): n/a METS: n/a          Dose of Adenosine (mg):  n/a Dose of Lexiscan: 0.4 mg  Dose of Atropine (mg): n/a Dose of Dobutamine: n/a mcg/kg/min (at max HR)  Stress Test Technologist: Ernestene MentionGwen Farrington, CCT Nuclear Technologist: Gonzella LexPam Phillips, CNMT   Rest Procedure:  Myocardial perfusion imaging was performed at rest 45 minutes following the intravenous administration of Technetium 7686m Sestamibi. Stress Procedure:  The patient received IV Lexiscan 0.4 mg over 15-seconds.  Technetium 7486m Sestamibi injected IV at 30-seconds.  There were no significant changes with Lexiscan.  Quantitative spect images were obtained after a 45 minute delay.  Transient Ischemic Dilatation (Normal <1.22):   1.07  QGS EDV:  175 ml QGS ESV:  93 ml LV Ejection Fraction: 47%        Rest ECG: NSR-LVH  Stress ECG: No significant change from baseline ECG  QPS Raw Data Images:  Normal; no motion artifact; normal heart/lung ratio. Stress Images:  Normal homogeneous uptake in all areas of the myocardium. Rest Images:  Normal homogeneous uptake in all areas of the myocardium. Subtraction (SDS):  No evidence of ischemia.  Impression Exercise Capacity:  Lexiscan with no exercise. BP Response:  Normal blood pressure response. Clinical Symptoms:  No significant symptoms noted. ECG Impression:  No significant ST segment change suggestive of ischemia. Comparison with Prior Nuclear Study: No images to compare  Overall Impression:  Normal stress nuclear study.  LV Wall Motion:  Mild LV dysfunction   Runell GessBERRY,Michie Molnar J, MD  05/17/2014 12:38 PM

## 2014-05-24 ENCOUNTER — Telehealth: Payer: Self-pay | Admitting: *Deleted

## 2014-05-24 NOTE — Telephone Encounter (Signed)
Left message for patient to call back. Need to discuss results and find out where to send clearance letter.

## 2014-05-24 NOTE — Telephone Encounter (Signed)
-----   Message from Runell GessJonathan J Berry, MD sent at 05/17/2014  1:32 PM EST ----- Low risk myoview with nl perfusion and mild LV dysfunction Cleared for surgery at low risk

## 2014-05-24 NOTE — Telephone Encounter (Signed)
Spoke to pt's daughter Jae DireKate. Advised her of pt's results of stress test and that he was cleared at low risk. [facility]. He has been discharged and is coming along well.

## 2014-06-16 ENCOUNTER — Other Ambulatory Visit: Payer: Self-pay | Admitting: Internal Medicine

## 2014-06-17 ENCOUNTER — Ambulatory Visit: Payer: Self-pay | Admitting: Cardiovascular Disease

## 2014-06-22 ENCOUNTER — Ambulatory Visit: Payer: No Typology Code available for payment source | Attending: Internal Medicine | Admitting: Internal Medicine

## 2014-06-22 ENCOUNTER — Encounter: Payer: Self-pay | Admitting: Internal Medicine

## 2014-06-22 ENCOUNTER — Ambulatory Visit (HOSPITAL_COMMUNITY)
Admission: RE | Admit: 2014-06-22 | Discharge: 2014-06-22 | Disposition: A | Discharge status: Home / Self Care | Payer: Self-pay | Source: Ambulatory Visit | Attending: Internal Medicine | Admitting: Internal Medicine

## 2014-06-22 DIAGNOSIS — N185 Chronic kidney disease, stage 5: Secondary | ICD-10-CM

## 2014-06-22 DIAGNOSIS — I120 Hypertensive chronic kidney disease with stage 5 chronic kidney disease or end stage renal disease: Secondary | ICD-10-CM | POA: Insufficient documentation

## 2014-06-22 DIAGNOSIS — M25562 Pain in left knee: Secondary | ICD-10-CM

## 2014-06-22 DIAGNOSIS — N183 Chronic kidney disease, stage 3 (moderate): Secondary | ICD-10-CM

## 2014-06-22 DIAGNOSIS — N184 Chronic kidney disease, stage 4 (severe): Secondary | ICD-10-CM

## 2014-06-22 DIAGNOSIS — I5033 Acute on chronic diastolic (congestive) heart failure: Secondary | ICD-10-CM | POA: Insufficient documentation

## 2014-06-22 DIAGNOSIS — E119 Type 2 diabetes mellitus without complications: Secondary | ICD-10-CM | POA: Insufficient documentation

## 2014-06-22 DIAGNOSIS — W19XXXA Unspecified fall, initial encounter: Secondary | ICD-10-CM

## 2014-06-22 DIAGNOSIS — N189 Chronic kidney disease, unspecified: Secondary | ICD-10-CM

## 2014-06-22 DIAGNOSIS — M25462 Effusion, left knee: Secondary | ICD-10-CM | POA: Insufficient documentation

## 2014-06-22 DIAGNOSIS — N181 Chronic kidney disease, stage 1: Secondary | ICD-10-CM

## 2014-06-22 DIAGNOSIS — Z794 Long term (current) use of insulin: Secondary | ICD-10-CM | POA: Insufficient documentation

## 2014-06-22 DIAGNOSIS — M179 Osteoarthritis of knee, unspecified: Secondary | ICD-10-CM | POA: Insufficient documentation

## 2014-06-22 DIAGNOSIS — W109XXA Fall (on) (from) unspecified stairs and steps, initial encounter: Secondary | ICD-10-CM | POA: Insufficient documentation

## 2014-06-22 DIAGNOSIS — Z79899 Other long term (current) drug therapy: Secondary | ICD-10-CM

## 2014-06-22 DIAGNOSIS — M2342 Loose body in knee, left knee: Secondary | ICD-10-CM | POA: Insufficient documentation

## 2014-06-22 DIAGNOSIS — N182 Chronic kidney disease, stage 2 (mild): Secondary | ICD-10-CM

## 2014-06-22 LAB — POCT URINALYSIS DIPSTICK
Bilirubin, UA: NEGATIVE
GLUCOSE UA: 250
KETONES UA: NEGATIVE
LEUKOCYTES UA: NEGATIVE
Nitrite, UA: NEGATIVE
Protein, UA: >=300
Spec Grav, UA: 1.02
Urobilinogen, UA: 1
pH, UA: 7

## 2014-06-22 LAB — COMPLETE METABOLIC PANEL WITH GFR
ALT: 18 U/L (ref 0–53)
AST: 13 U/L (ref 0–37)
Albumin: 3.3 g/dL — ABNORMAL LOW (ref 3.5–5.2)
Alkaline Phosphatase: 89 U/L (ref 39–117)
BUN: 48 mg/dL — AB (ref 6–23)
CALCIUM: 8.4 mg/dL (ref 8.4–10.5)
CHLORIDE: 92 meq/L — AB (ref 96–112)
CO2: 30 meq/L (ref 19–32)
CREATININE: 4.95 mg/dL — AB (ref 0.50–1.35)
GFR, EST AFRICAN AMERICAN: 13 mL/min — AB
GFR, EST NON AFRICAN AMERICAN: 11 mL/min — AB
Glucose, Bld: 322 mg/dL — ABNORMAL HIGH (ref 70–99)
Potassium: 3.6 mEq/L (ref 3.5–5.3)
Sodium: 136 mEq/L (ref 135–145)
Total Bilirubin: 0.5 mg/dL (ref 0.2–1.2)
Total Protein: 6.3 g/dL (ref 6.0–8.3)

## 2014-06-22 LAB — POCT GLYCOSYLATED HEMOGLOBIN (HGB A1C): Hemoglobin A1C: 9.3

## 2014-06-22 LAB — GLUCOSE, POCT (MANUAL RESULT ENTRY)
POC GLUCOSE: 300 mg/dL — AB (ref 70–99)
POC Glucose: 304 mg/dl — AB (ref 70–99)

## 2014-06-22 MED ORDER — INSULIN SYRINGE 28G X 1/2" 0.5 ML MISC
Status: DC
Start: 2014-06-22 — End: 2015-12-22

## 2014-06-22 MED ORDER — INSULIN NPH ISOPHANE & REGULAR (70-30) 100 UNIT/ML ~~LOC~~ SUSP: 15.0000 [IU] | Freq: Two times a day (BID) | SUBCUTANEOUS | Status: DC

## 2014-06-22 MED ORDER — INSULIN ASPART 100 UNIT/ML ~~LOC~~ SOLN
10.0000 [IU] | Freq: Once | SUBCUTANEOUS | Status: AC
  Administered 2014-06-22: 10 [IU] via SUBCUTANEOUS

## 2014-06-22 NOTE — Progress Notes (Signed)
Patient ID: Michael Dorsey, male   DOB: November 04, 1945, 69 y.o.   MRN: [id number]  CC: f/u  HPI: Michael Dorsey is a 69 y.o. male here today for a follow up visit.  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  He reports today as a follow up.  He currently has a Left AV fistula and is now receiving dialysis on Tuesday, Thursday, and Saturday.   He is currently being followed by [facility] but has not been since before AV fistula placement on 05/19/14 at [facility]. He is a poor historian and reports being unsure of follow up dates.  He is unable to tell me what his BP ranges are on dialysis days.   He reports that he has been running low on his insuin and has not had any insulin in over one week. His wife shows me the bottle which has a substantial amount left in the bottle. He reports a fall at home two days ago when he was walking up the stairs. He reports that he fell down on his left knee causing him to have swelling and severe pain. He denies hitting head or losing consciousness.  He reports that he has not had any difficult with breathing. No orthopnea, PND, SOB, or fatigue.   No Known Allergies Past Medical History  Diagnosis Date  . Hypertension   . Diabetes mellitus without complication   . Hypercholesteremia   . Renal disorder     in process of being diagnosed  . Diastolic dysfunction     heart failure with preserved LV function   Current Outpatient Prescriptions on File Prior to Visit  Medication Sig Dispense Refill  . calcitRIOL (ROCALTROL) 0.25 MCG capsule TAKE 1 CAPSULE BY MOUTH DAILY. 30 capsule 1  . carvedilol (COREG) 25 MG tablet Take 1 tablet (25 mg total) by mouth 2 (two) times daily with a meal. 60 tablet 3  . furosemide (LASIX) 80 MG tablet Take 1 tablet (80 mg total) by mouth 3 (three) times daily. Take three times a day per wife 90 tablet 3  . GlucoCom  Lancets MISC Check blood sugar TID & QHS 100 each 4  . glucose blood (BAYER CONTOUR NEXT TEST) test strip 1 each by Other route as needed for other (blood sugar). Use as instructed    . glucose blood (CHOICE DM FORA G20 TEST STRIPS) test strip Use as instructed 100 each 12  . glucose monitoring kit (FREESTYLE) monitoring kit 1 each by Does not apply route as needed for other. 1 each 0  . hydrALAZINE (APRESOLINE) 50 MG tablet Take 1 tablet (50 mg total) by mouth every 8 (eight) hours. 90 tablet 2  . insulin NPH-regular Human (NOVOLIN 70/30) (70-30) 100 UNIT/ML injection Inject 15 Units into the skin 2 (two) times daily with a meal.    . isosorbide mononitrate (IMDUR) 60 MG 24 hr tablet Take 1.5 tablets (90 mg total) by mouth daily. 30 tablet 4  . Lancet Device MISC 1 strip by Does not apply route daily as needed.     . polyethylene glycol (MIRALAX / GLYCOLAX) packet Take 17 g by mouth 2 (two) times daily. (Patient not taking: Reported on 06/22/2014) 14 each 0  . potassium chloride SA (K-DUR,KLOR-CON) 20 MEQ tablet Take 2 tablets (40 mEq total) by mouth 2 (two) times daily. (Patient not taking: Reported on 06/22/2014) 60 tablet 2  . rosuvastatin (  CRESTOR) 10 MG tablet Take 1 tablet (10 mg total) by mouth 2 (two) times daily. (Patient not taking: Reported on 06/22/2014) 30 tablet 2   No current facility-administered medications on file prior to visit.   History reviewed. No pertinent family history. History   Social History  . Marital Status: Married    Spouse Name: N/A  . Number of Children: N/A  . Years of Education: N/A   Occupational History  . Not on file.   Social History Main Topics  . Smoking status: Never Smoker   . Smokeless tobacco: Never Used  . Alcohol Use: No  . Drug Use: No  . Sexual Activity: Not Currently   Other Topics Concern  . Not on file   Social History Narrative    Review of Systems: Constitutional: Negative for fever, chills, diaphoresis, activity change,  appetite change and fatigue. HENT: Negative for ear pain, nosebleeds, congestion, facial swelling, rhinorrhea, neck pain, neck stiffness and ear discharge.  Eyes: Negative for pain, discharge, redness, itching and visual disturbance. Respiratory: Negative for cough, choking, chest tightness, shortness of breath, wheezing and stridor.  Cardiovascular: Negative for chest pain, palpitations and leg swelling. Gastrointestinal: Negative for abdominal distention. Genitourinary: Negative for dysuria, urgency, frequency, hematuria, flank pain, decreased urine volume, difficulty urinating and dyspareunia.  Musculoskeletal: Negative for back pain, joint swelling, arthralgias and gait problem. Neurological: Negative for dizziness, tremors, seizures, syncope, facial asymmetry, speech difficulty, weakness, light-headedness, numbness and headaches.  Hematological: Negative for adenopathy. Does not bruise/bleed easily. Psychiatric/Behavioral: Negative for hallucinations, behavioral problems, confusion, dysphoric mood, decreased concentration and agitation.    Objective:   Filed Vitals:   06/22/14 1000  BP: 180/77  Pulse: 88  Temp: 97.7 F (36.5 C)  Resp: 16    Physical Exam  Constitutional: He is oriented to person, place, and time.  Cardiovascular: Regular rhythm and intact distal pulses.  Exam reveals gallop.   Patent AV fistula  Pulmonary/Chest: Effort normal and breath sounds normal. He has no wheezes. He has no rales.  Musculoskeletal: He exhibits edema (left knee swollen).  Pain with left knee ROM  Neurological: He is alert and oriented to person, place, and time.     Lab Results  Component Value Date   WBC 3.6* 03/17/2014   HGB 10.3* 03/17/2014   HCT 31.0* 03/17/2014   MCV 81.2 03/17/2014   PLT 211 03/17/2014   Lab Results  Component Value Date   CREATININE 3.72* 03/21/2014   BUN 42* 03/21/2014   NA 136 03/21/2014   K 4.5 03/21/2014   CL 101 03/21/2014   CO2 27 03/21/2014     Lab Results  Component Value Date   HGBA1C 9.3 06/22/2014   Lipid Panel  No results found for: CHOL, TRIG, HDL, CHOLHDL, VLDL, LDLCALC     Assessment and plan:   Wasyl was seen today for follow-up.  Diagnoses and all orders for this visit:  Hypertension, renal disease, stage 5 chronic kidney disease or end stage renal disease Patient's pressure is not well controlled, unable to determine if patients pressure is controlled on dialysis days. He is a poor historian, I will defer patient back to Nephrologist for determining if medication changes are needed.   DM II (diabetes mellitus, type II), controlled Orders: -     Glucose (CBG) -     HgB A1c -     insulin aspart (novoLOG) injection 10 Units; Inject 0.1 mLs (10 Units total) into the skin once. -     Urinalysis  Dipstick -     insulin NPH-regular Human (NOVOLIN 70/30) (70-30) 100 UNIT/ML injection; Inject 15 Units into the skin 2 (two) times daily with a meal. -     INSULIN SYRINGE .5CC/28G 28G X 1/2" 0.5 ML MISC; Take insulin twice daily for e11.40 -     Glucose (CBG) Patients diabetes remains uncontrolled as evidence by hemoglobin a1c >8.  Patient has been non-compliant with medication regimen. Stressed the multiple complications associated with uncontrolled diabetes.  Patient will stay on current medication dose and report back to clinic with cbg log in 2 weeks.  Long-term use of high-risk medication Orders: -     COMPLETE METABOLIC PANEL WITH GFR---will check potassium levels  Fall, initial encounter/Left knee pain Orders: -     DG Knee Complete 4 Views Left; Future Effusion, send to ortho for aspiration   Acute on chronic diastolic CHF (congestive heart failure) Continue to monitor weight and breathing.  Return for 2 weeks Nurse Visit-cbg/BP and 6 weeks PCP.       Chari Manning, NP-C [facility] [phone] 06/22/2014, 10:25 AM

## 2014-06-22 NOTE — Progress Notes (Signed)
Pt is here following up on his HTN, hyperlipidemia and his diabetes. Pt states that he fell on Saturday and injured his left knee. His knee is in extreme pain.

## 2014-07-01 ENCOUNTER — Telehealth: Payer: Self-pay | Admitting: *Deleted

## 2014-07-01 DIAGNOSIS — M25462 Effusion, left knee: Secondary | ICD-10-CM

## 2014-07-01 NOTE — Telephone Encounter (Signed)
-----   Message from Ambrose FinlandValerie A Keck, NP sent at 06/23/2014  6:06 PM EDT ----- Please send ortho referral. Patient has large knee effusion that may require drainage or injection.

## 2014-07-01 NOTE — Telephone Encounter (Signed)
Pt is aware of the findings and he is aware of the ortho referral.

## 2014-07-05 ENCOUNTER — Telehealth: Payer: Self-pay | Admitting: *Deleted

## 2014-07-05 MED ORDER — POTASSIUM CHLORIDE ER 10 MEQ PO TBCR: 10.0000 meq | EXTENDED_RELEASE_TABLET | Freq: Every day | ORAL | Status: DC

## 2014-07-05 NOTE — Telephone Encounter (Signed)
-----   Message from Ambrose FinlandValerie A Keck, NP sent at 06/23/2014  6:09 PM EDT ----- Potassium is still within normal limits. Find out if patient makes urine and how much. Find out if he has made appt to see kidney specialist. Please send potassium 10 mEq to take daily. Will need potassium rechecked when he gets to kidney specialist or by me in 1 month.

## 2014-07-05 NOTE — Telephone Encounter (Signed)
Spoke with pt's daughter and gave her his lab results.

## 2014-07-08 ENCOUNTER — Encounter: Payer: Self-pay | Admitting: Family Medicine

## 2014-07-08 ENCOUNTER — Ambulatory Visit (INDEPENDENT_AMBULATORY_CARE_PROVIDER_SITE_OTHER): Payer: No Typology Code available for payment source | Admitting: Family Medicine

## 2014-07-08 VITALS — BP 176/85 | HR 78 | Ht 69.0 in | Wt 161.0 lb

## 2014-07-08 DIAGNOSIS — M25462 Effusion, left knee: Secondary | ICD-10-CM

## 2014-07-08 DIAGNOSIS — M25562 Pain in left knee: Secondary | ICD-10-CM

## 2014-07-08 MED ORDER — METHYLPREDNISOLONE ACETATE 40 MG/ML IJ SUSP
40.0000 mg | Freq: Once | INTRAMUSCULAR | Status: AC
  Administered 2014-07-08: 40 mg via INTRA_ARTICULAR

## 2014-07-08 NOTE — Progress Notes (Signed)
Subjective:    Patient ID: Michael Dorsey, male    DOB: [birth year], 69 y.o.   MRN: [id number]  HPI [name] is a 69 year old male who presents with his daughter for left knee pain. Onset was approximately 3 weeks ago following a fall down a few stairs in which he hyperflexed his left knee. He noticed pain and swelling following the injury. He was seen by his primary care physician, who ordered x-rays of the left knee. This did not show any acute fracture, but did show a loose body in the knee joint. He has noticed persistent pain and swelling it is worse with walking. Rest and elevation relieves his symptoms. He has not tried anything else. 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  His past medical history is complicated by end-stage renal disease on dialysis, hypertension, and uncontrolled diabetes. Review of Systems 7 point review of systems was performed and was otherwise negative unless noted in the history of present illness.     Objective:   Physical Exam BP 176/85 mmHg  Pulse 78  Ht 5\' 9"  (1.753 m)  Wt 161 lb (73.029 kg)  BMI 23.76 kg/m2 GEN: The patient is well-developed well-nourished male and in no acute distress.  He is awake alert and oriented x3. SKIN: warm and well-perfused, no rash  EXTR: No calf tenderness Neuro: Strength 5/5 globally. Sensation intact throughout. No focal deficits. Vasc: +2 bilateral distal pulses. MSK: Examination of the left knee reveals range of motion from 5-110 with pain at the endpoints of motion. Moderate to large left knee effusion is present. No overlying erythema, warmth, or induration. Tenderness to palpation along the medial joint line. Positive patellar grind test. He has active extension of the knee with palpably intact quadriceps and patellar tendons. No knee instability.  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 A loose body is noted as well.  Limited  musculoskeletal ultrasound: Long and short axis views were obtained of the left knee which show a 6.25 cm effusion in the suprapatellar pouch. Examination of the medial joint line reveals significant narrowing and osteophyte formation as well as a 50% protruding meniscus with surrounding hypoechoic fluid. The quadriceps and patellar tendons appear to be intact.  Aspiration and Injection Left Knee: Consent obtained and verified. In particular, we discussed the risk of blood glucose elevation and transient blood pressure elevation, with the associated risk of stroke or MI. Sterile betadine prep. Furthur cleansed with alcohol. Topical analgesic spray: Ethyl chloride. Joint/Bursa: Left Knee Approached in typical fashion with: 5 cc's plain 1% lidocaine for anesthesia. Using an 18-gauge needle on a 60 mL syringe 20 mL of clear yellow joint aspirate were obtained. This was not sent for culture or crystals, as the effusion did not warrant suspicion for septic arthritis or gout. The large syringe was removed and 40 mg methylprednisolone and 4 mL of 1% plain lidocaine was injected through the same 18-gauge needle. The needle was removed and hemostasis was achieved with application of a Band-Aid. Completed without difficulty. Aftercare instructions and Red flags advised.  Particular attention was made to counseled regarding signs of infection to watch for, as well as close monitoring of blood glucose and blood pressure. Should any concern for septic arthritis arise, I instructed him to contact us immediately for reevaluation. If his blood glucose is significantly elevated from his normal levels or if his blood pressure remains elevated despite dialysis, and instructed him to either contact us or his  primary care physician to discuss further titration of medications if needed.     Assessment & Plan:  Please see problem based assessment and plan in the problem list.

## 2014-07-08 NOTE — Assessment & Plan Note (Addendum)
6.5cm moderate to large effusion in suprapatellar pouch on US today, 07/08/14. Likely due to acute exacerbation of OA, combined with loose body and meniscal injury. X-ray with severe tricompartmental OA and loose body. No fracture.  Aspiration/Injection today: Clear yellow fluid -Particular attention was made to counseled regarding signs of infection to watch for, as well as close monitoring of blood glucose and blood pressure. Should any concern for septic arthritis arise, I instructed him to contact us immediately for reevaluation. If his blood glucose is significantly elevated from his normal levels or if his blood pressure remains elevated despite dialysis, and instructed him to either contact us or his primary care physician to discuss further titration of medications if needed.  -Wrapped with ACE wrap to wear for next 2 weeks. -Ice, elevate, rest -Closely monitor BP and glucose, post-CST injection -Plan follow-up in 2 weeks, as may need re-aspiration -Discussion may need to be made if persists regarding risks/benefits of knee replacement surgery, though poor candidate right now with uncontrolled diabetes and ESRD. -Consider viscosupplementation and re-injection of CST if persists.

## 2014-07-22 ENCOUNTER — Ambulatory Visit (INDEPENDENT_AMBULATORY_CARE_PROVIDER_SITE_OTHER): Payer: No Typology Code available for payment source | Admitting: Family Medicine

## 2014-07-22 ENCOUNTER — Encounter: Payer: Self-pay | Admitting: Family Medicine

## 2014-07-22 VITALS — BP 166/81

## 2014-07-22 DIAGNOSIS — M25462 Effusion, left knee: Secondary | ICD-10-CM

## 2014-07-22 DIAGNOSIS — M25562 Pain in left knee: Secondary | ICD-10-CM

## 2014-07-22 NOTE — Patient Instructions (Signed)
[facility]  Mailing Address:         [address]                                       [place], [place] [address]   Phone:                          [phone], please ask for Sales Fax:                               [phone]   Email:                            [email or web address]

## 2014-07-22 NOTE — Assessment & Plan Note (Signed)
Significant improvement in knee pain and effusion. He still has a small effusion. 4 to think he has capped the Ace wrap on his left knee most of the time which is caused some pooling of fluid in the lower left extremity. There is no sign of DVT, no sign of cellulitis. I discussed this at length with him and with his male family member. He needs to elevated over the next 48 hours at least at hip height or above. In general anytime he sitting down he needs to elevated at this level. Also gave him information on a way to purchase some support hose that are fairly good but not horribly expensive at elastic therapy in [place]. I would recommend he use these. To get rid of the current swelling we placed him in an Ace wrap starting a toes up to right below the knee. He is to leave this on for the next 24-48 hours and then discontinue it totally. If he has any new or worsening leg pain or swelling he needs to be seen immediately either here at the emergency department.  I suspect is severe osteoarthritis of the left knee will cause pain again in the future we would be happy see him back. He could be L Roderic ScarceJewell for corticosteroid injection every 3 months if needed. Explained all of these things at length spinning greater than 50% of her 40 minute office visit in counseling and education.

## 2014-07-22 NOTE — Progress Notes (Signed)
Patient ID: Michael Dorsey, male   DOB: [birth year], 69 y.o.   MRN: [id number]  Michael Dorsey - 69 y.o. male MRN [id number]  Date of birth: [birth year]    SUBJECTIVE:     Follow-up left knee pain. At last office visit he had an aspiration and injection. At the time he had fairly significant knee effusion. He has long history of knee arthritis but had sustained a fall on 06/18/2014. After the fall he had significant increase in his pain and swelling. He had x-rays done on March 23. Since the aspiration and injection at last office visit he's had significant improvement in his knee pain and swelling. He still has some knee effusion. He's been wearing the Ace bandage quite a bit around the knee. When she's noticed in the last few days is that his foot and ankle and area below the knee are extremely swollen. He has history of CHF and bilateral pedal edema but it's never been unilateral before. He denies any calf pain. He's not had any redness or warmth of the left lower extremity. ROS:     See history of present illness  PERTINENT  PMH / PSH FH / / SH:  Past Medical, Surgical, Social, and Family History Reviewed & Updated in the EMR.  Pertinent findings include:  End-stage renal disease CHF Hypertension Diabetes mellitus  OBJECTIVE: BP 166/81 mmHg  Physical Exam:  Vital signs are reviewed. GEN.: Patient is no acute distress. He is accompanied by male family member. EXTREMITY: Left foot and lower extremity up to about 2 inches above the ankle have some nonpitting edema. This area is not tender to palpation. The calf is supple, without mass, nontender, no erythema, no warmth. Negative Homans's sign. He has some imprints in the skin from the Ace bandage which she is had wrapped around his knee and the swelling pretty much starts right below where those imprints stop. His right foot has some mild nonpitting edema but significantly less than his left. KNEE: Left. Small effusion he lacks full  extension by 10. He has full flexion. He has medial and lateral joint line tenderness. He has crepitus on extension. GAIT: He walks with a cane and has an antalgic gait. IMAGING: Complete for a few x-rays of the left knee done on March 23 show tricompartmental severe osteoarthritis with some evidence of loose bodies and effusion.  ASSESSMENT & PLAN:  See problem based charting & AVS for pt instructions.

## 2014-07-29 ENCOUNTER — Ambulatory Visit: Payer: No Typology Code available for payment source | Attending: Internal Medicine | Admitting: *Deleted

## 2014-07-29 VITALS — BP 134/62 | HR 71 | Temp 98.3°F | Resp 18

## 2014-07-29 DIAGNOSIS — I151 Hypertension secondary to other renal disorders: Secondary | ICD-10-CM

## 2014-07-29 DIAGNOSIS — E119 Type 2 diabetes mellitus without complications: Secondary | ICD-10-CM

## 2014-07-29 DIAGNOSIS — Z992 Dependence on renal dialysis: Secondary | ICD-10-CM | POA: Insufficient documentation

## 2014-07-29 DIAGNOSIS — E1165 Type 2 diabetes mellitus with hyperglycemia: Secondary | ICD-10-CM | POA: Insufficient documentation

## 2014-07-29 DIAGNOSIS — N2889 Other specified disorders of kidney and ureter: Secondary | ICD-10-CM

## 2014-07-29 LAB — GLUCOSE, POCT (MANUAL RESULT ENTRY): POC GLUCOSE: 243 mg/dL — AB (ref 70–99)

## 2014-07-29 MED ORDER — GLUCOSE BLOOD VI STRP: ORAL_STRIP | Status: DC

## 2014-07-29 MED ORDER — INSULIN NPH ISOPHANE & REGULAR (70-30) 100 UNIT/ML ~~LOC~~ SUSP: SUBCUTANEOUS | Status: DC

## 2014-07-29 MED ORDER — ROSUVASTATIN CALCIUM 10 MG PO TABS: 10.0000 mg | ORAL_TABLET | Freq: Two times a day (BID) | ORAL | Status: DC

## 2014-07-29 MED ORDER — FREESTYLE SYSTEM KIT: 1.0000 | PACK | Status: DC | PRN

## 2014-07-29 MED ORDER — ISOSORBIDE MONONITRATE ER 60 MG PO TB24: 60.0000 mg | ORAL_TABLET | Freq: Every day | ORAL | Status: DC

## 2014-07-29 NOTE — Progress Notes (Signed)
Patient presents with daughter for CBG and record review for T2DM Med list reviewed; patient reports taking all meds as directed except not taking crestor and taking only  1 tab of imdur instead of 1.5 tabs Taking 70/30 15 units bid Patient's AM fasting blood sugars ranging 77-189 Patient's before lunch blood sugars ranging 110-270 Patient's before dinner blood sugars ranging 180-300 1 reading at 438 Patient's before bedtime blood sugars ranging 200-340 BS readings are from 06/24/14-07/09/14. Patient states he has continued to check BS but has not been recording for last 2 weeks. Shunt left antecub; receiving dialysis T, Th and Sat BP taken in right arm  CBG 243 1.5 hours after meal of grits with milk, 2 slices of bread, 1 egg and water  Lab Results  Component Value Date   HGBA1C 9.3 06/22/2014   Filed Vitals:   07/29/14 0955  BP: 134/62  Pulse: 71  Temp: 98.3 F (36.8 C)  Resp: 18    Per PCP: Increase evening dose of 70/30 to 18 units; Keep AM dose at 15 units Update med list to reflect patient taking Imdur 60 mg 1 tab only   Patient advised to call for med refills at least 7 days before running out so as not to go without.  Patient to return in 2 weeks for nurse visit for BP check, CBG and record review

## 2014-08-26 ENCOUNTER — Telehealth: Payer: Self-pay | Admitting: Internal Medicine

## 2014-09-02 ENCOUNTER — Other Ambulatory Visit: Payer: Self-pay | Admitting: *Deleted

## 2014-09-02 MED ORDER — CARVEDILOL 25 MG PO TABS: 25.0000 mg | ORAL_TABLET | Freq: Two times a day (BID) | ORAL | Status: DC

## 2014-09-02 MED ORDER — CALCITRIOL 0.25 MCG PO CAPS: 0.2500 ug | ORAL_CAPSULE | Freq: Every day | ORAL | Status: DC

## 2014-09-02 MED ORDER — FUROSEMIDE 80 MG PO TABS: 80.0000 mg | ORAL_TABLET | Freq: Three times a day (TID) | ORAL | Status: DC

## 2014-09-02 NOTE — Telephone Encounter (Signed)
Patient came into office requesting medication refill for furosemide (LASIX) 80 MG tablet,carvedilol (COREG) 25 MG tablet,calcitRIOL (ROCALTROL) 0.25 MCG capsule Patient is waiting in clinic

## 2014-10-06 ENCOUNTER — Other Ambulatory Visit: Payer: Self-pay | Admitting: Internal Medicine

## 2014-10-14 ENCOUNTER — Other Ambulatory Visit: Payer: Self-pay | Admitting: Internal Medicine

## 2014-10-28 ENCOUNTER — Ambulatory Visit: Payer: No Typology Code available for payment source | Attending: Internal Medicine | Admitting: Internal Medicine

## 2014-10-28 ENCOUNTER — Encounter: Payer: Self-pay | Admitting: Internal Medicine

## 2014-10-28 VITALS — BP 129/68 | HR 78 | Temp 98.4°F | Resp 18 | Ht 70.0 in | Wt 163.0 lb

## 2014-10-28 DIAGNOSIS — E785 Hyperlipidemia, unspecified: Secondary | ICD-10-CM | POA: Insufficient documentation

## 2014-10-28 DIAGNOSIS — I5033 Acute on chronic diastolic (congestive) heart failure: Secondary | ICD-10-CM | POA: Insufficient documentation

## 2014-10-28 DIAGNOSIS — I10 Essential (primary) hypertension: Secondary | ICD-10-CM

## 2014-10-28 DIAGNOSIS — I129 Hypertensive chronic kidney disease with stage 1 through stage 4 chronic kidney disease, or unspecified chronic kidney disease: Secondary | ICD-10-CM | POA: Insufficient documentation

## 2014-10-28 DIAGNOSIS — E1140 Type 2 diabetes mellitus with diabetic neuropathy, unspecified: Secondary | ICD-10-CM

## 2014-10-28 DIAGNOSIS — Z794 Long term (current) use of insulin: Secondary | ICD-10-CM | POA: Insufficient documentation

## 2014-10-28 DIAGNOSIS — N184 Chronic kidney disease, stage 4 (severe): Secondary | ICD-10-CM

## 2014-10-28 DIAGNOSIS — Z9111 Patient's noncompliance with dietary regimen: Secondary | ICD-10-CM | POA: Insufficient documentation

## 2014-10-28 DIAGNOSIS — E1165 Type 2 diabetes mellitus with hyperglycemia: Secondary | ICD-10-CM

## 2014-10-28 DIAGNOSIS — IMO0002 Reserved for concepts with insufficient information to code with codable children: Secondary | ICD-10-CM

## 2014-10-28 LAB — BASIC METABOLIC PANEL
BUN: 50 mg/dL — ABNORMAL HIGH (ref 7–25)
CALCIUM: 9.3 mg/dL (ref 8.6–10.3)
CHLORIDE: 92 mmol/L — AB (ref 98–110)
CO2: 32 mmol/L — AB (ref 20–31)
CREATININE: 6.22 mg/dL — AB (ref 0.70–1.18)
Glucose, Bld: 251 mg/dL — ABNORMAL HIGH (ref 65–99)
POTASSIUM: 4.2 mmol/L (ref 3.5–5.3)
Sodium: 135 mmol/L (ref 135–146)

## 2014-10-28 LAB — GLUCOSE, POCT (MANUAL RESULT ENTRY): POC Glucose: 260 mg/dl — AB (ref 70–99)

## 2014-10-28 LAB — POCT GLYCOSYLATED HEMOGLOBIN (HGB A1C): Hemoglobin A1C: 10.2

## 2014-10-28 MED ORDER — ATORVASTATIN CALCIUM 40 MG PO TABS: 40.0000 mg | ORAL_TABLET | Freq: Every day | ORAL | Status: DC

## 2014-10-28 MED ORDER — ROSUVASTATIN CALCIUM 10 MG PO TABS: 10.0000 mg | ORAL_TABLET | Freq: Two times a day (BID) | ORAL | Status: DC

## 2014-10-28 NOTE — Progress Notes (Signed)
Follow up for diabetes. Current blood glucose 260  Patient does not have sugar log. Fasting blood sugar this morning was 165.  Blood sugar range 67-250.  Patient had Novolin 15 units this morning. Patient had breakfast, oatmeal, bread, and boiled eggs, and drank water,  less than 1 hour.  Patient takes a "calcium pill".

## 2014-10-28 NOTE — Progress Notes (Addendum)
Patient ID: Kel Senn, male   DOB: 01-28-1946, 69 y.o.   MRN: [id number] 1. HTN: Medication: coreg, lasix, hydralazine, isosorbide Home BP monitoring: does not monitor Positive ROS n/a Negative [id number], chest pain, SOB, palpitation    2. DM2:  Medication: Novolin 70/30 15 units Am, 18 units PM Home CBG monitoring: Did not bring log book but reports range of 67-250.  Hypoglycemic event: rarely  Positive ROS neuropathy-hands/feet Negative ROS: blurred vision, polyuria/dipsia Patient is non-compliant with diabetic diet. He takes his insulin daily but continues to have a diet high in starch/carbohydrates. Patient's A1C has increased from 9.3 to 10.2%. Patient had oatmeal and bread this AM before office visit.    3. HLD: Medication: Crestor--unable to afford. Has been taking atorvastatin 40 mg once daily Tolerance: well  Positive ROS n/a Negative [id number]:[id number], claudication   Social History reviewed: Smoker Never Exercise not currently   Physical Exam  Constitutional: He is oriented to person, place, and time.  Cardiovascular: Normal rate, regular rhythm and normal heart sounds.   Pulses:      Dorsalis pedis pulses are 1+ on the right side, and 1+ on the left side.       Posterior tibial pulses are 1+ on the right side, and 1+ on the left side.  Pulmonary/Chest: Effort normal and breath sounds normal.  Abdominal: Soft. Bowel sounds are normal.  Musculoskeletal: He exhibits no edema.  Feet:  Right Foot:  Protective Sensation: 10 sites tested.4 sites sensed. Left Foot:  Protective Sensation: 10 sites tested. 3 sites sensed. Neurological: He is alert and oriented to person, place, and time.    Melven was seen today for follow-up.  Diagnoses and all orders for this visit:  DM type 2, uncontrolled, with neuropathy Orders: -     Microalbumin, urine -     POCT glycosylated hemoglobin (Hb A1C) -     POCT glucose (manual entry) -     Amb Referral to Nutrition and  Diabetic E -     Basic Metabolic Panel -     Lipid panel; Future Patients diabetes remains uncontrolled as evidence by hemoglobin a1c >8.  Patient has been non-compliant with ADA diet recommendations. Stressed the multiple complications associated with uncontrolled diabetes.  Patient will stay on current medication dose and report back to clinic with cbg log in 2 weeks. I will also refer patient to diabetes education and nutrition.  Accelerated hypertension  Patient blood pressure is stable and may continue on current medication.  Education on diet, exercise, and modifiable risk factors discussed. Will obtain appropriate labs as needed. Will follow up in 3-6 months.   Hyperlipidemia Orders: -    Refill atorvastatin 40 mg QD. Education provided on proper lifestyle changes in order to lower cholesterol. Patient advised to maintain healthy weight and to keep total fat intake at 25-35% of total calories and carbohydrates 50-60% of total daily calories. Explained how high cholesterol places patient at risk for heart disease. Patient placed on appropriate medication and repeat labs in 6 months   Acute on chronic diastolic CHF (congestive heart failure) Continue Coreg and cardiology f/u  Chronic kidney disease (CKD), stage IV (severe) Patient has been referred to Nephrology in the past. Will recheck labs today   Return for 1 wk RN/Lab visit and 3 week Nurse Visit-log review, 3 mo PCP .  Ambrose Finland, NP 10/28/2014 1:46 PM

## 2014-10-28 NOTE — Addendum Note (Signed)
Addended by: Holland Commons A on: 10/28/2014 02:27 PM   Modules accepted: Orders, Medications

## 2014-10-29 LAB — MICROALBUMIN, URINE: Microalb, Ur: 40.6 mg/dL — ABNORMAL HIGH (ref <2.0)

## 2014-10-31 ENCOUNTER — Telehealth: Payer: Self-pay

## 2014-10-31 ENCOUNTER — Encounter: Payer: Self-pay | Admitting: Internal Medicine

## 2014-10-31 DIAGNOSIS — Z992 Dependence on renal dialysis: Secondary | ICD-10-CM

## 2014-10-31 DIAGNOSIS — N186 End stage renal disease: Secondary | ICD-10-CM | POA: Insufficient documentation

## 2014-10-31 NOTE — Telephone Encounter (Signed)
Patients daughter returned phone call, please f/u with pt.

## 2014-10-31 NOTE — Telephone Encounter (Signed)
Nurse called [employer]. Patient sees Dr. Annie Sable at [facility].

## 2014-10-31 NOTE — Telephone Encounter (Signed)
-----   Message from Valerie A Keck, NP sent at 10/30/2014  8:48 PM EDT ----- Patients kidney function is severely elevated. FInd out if he was was able to see Nephrologist. If not he needs to go ASAP! 

## 2014-10-31 NOTE — Telephone Encounter (Signed)
Patients daughter, Nicole Cella, called nurse, verified date of birth. Patient currently on dialysis Tuesday, Thursday and Saturday at [facility] on [address].  Patient went to [facility] in [place] before surgical removal of kidney in February 2016. Kidney surgically removed at [facility] in February 2016. Patient has seen physician for follow up appointment after surgery. Daughter reports appointment with urology at [facility] in September.  Nicole Cella reports patient needs appointment at the [facility] in [place].

## 2014-10-31 NOTE — Telephone Encounter (Signed)
Nurse called patient. Daughter, Jae Dire, answered telephone. Per instructions on epic, nurse can speak with Jae Dire or Nicole Cella, both are the patients' daughters.  Jae Dire verified patients date of birth. Jae Dire explains they removed one kidney in February 2016. Per daughter, Jae Dire, patient has appointment with nephrology in September.  Jae Dire requested nurse to call Margot Ables sister to find out details of appointment with nephrology in September.  Nurse left message on voicemail for Nicole Cella to return call to [facility] and ask for [place], [phone].

## 2014-10-31 NOTE — Telephone Encounter (Signed)
-----   Message from Michael Finland, NP sent at 10/30/2014  8:48 PM EDT ----- Patients kidney function is severely elevated. FInd out if he was was able to see Nephrologist. If not he needs to go ASAP!

## 2014-11-01 ENCOUNTER — Other Ambulatory Visit: Payer: Self-pay | Admitting: Internal Medicine

## 2014-11-01 ENCOUNTER — Encounter: Payer: Self-pay | Admitting: Nephrology

## 2014-11-01 ENCOUNTER — Ambulatory Visit: Payer: No Typology Code available for payment source

## 2014-11-01 ENCOUNTER — Ambulatory Visit: Payer: No Typology Code available for payment source | Attending: Internal Medicine | Admitting: Pharmacist

## 2014-11-01 DIAGNOSIS — E1140 Type 2 diabetes mellitus with diabetic neuropathy, unspecified: Secondary | ICD-10-CM

## 2014-11-01 DIAGNOSIS — E785 Hyperlipidemia, unspecified: Secondary | ICD-10-CM

## 2014-11-01 DIAGNOSIS — IMO0002 Reserved for concepts with insufficient information to code with codable children: Secondary | ICD-10-CM

## 2014-11-01 DIAGNOSIS — E1165 Type 2 diabetes mellitus with hyperglycemia: Secondary | ICD-10-CM

## 2014-11-01 NOTE — Patient Instructions (Signed)
Please schedule lab appointment for later this week.  Please schedule nurse visit to go over blood sugars in two weeks.

## 2014-11-01 NOTE — Progress Notes (Signed)
Patient did not fast for labs, will reschedule lab appointment for later this week and will schedule nurse visit for 2 weeks for blood glucose log follow up.

## 2014-11-04 ENCOUNTER — Telehealth: Payer: Self-pay | Admitting: Internal Medicine

## 2014-11-04 ENCOUNTER — Encounter: Payer: Self-pay | Admitting: Internal Medicine

## 2014-11-04 ENCOUNTER — Ambulatory Visit: Payer: No Typology Code available for payment source | Attending: Internal Medicine | Admitting: Internal Medicine

## 2014-11-04 ENCOUNTER — Ambulatory Visit: Payer: No Typology Code available for payment source

## 2014-11-04 ENCOUNTER — Ambulatory Visit (HOSPITAL_COMMUNITY)
Admission: RE | Admit: 2014-11-04 | Discharge: 2014-11-04 | Disposition: A | Discharge status: Home / Self Care | Payer: No Typology Code available for payment source | Source: Ambulatory Visit | Attending: Internal Medicine | Admitting: Internal Medicine

## 2014-11-04 ENCOUNTER — Other Ambulatory Visit: Payer: Self-pay | Admitting: Internal Medicine

## 2014-11-04 VITALS — BP 153/72 | HR 71 | Temp 98.4°F | Resp 18 | Ht 70.0 in | Wt 160.0 lb

## 2014-11-04 DIAGNOSIS — Y92099 Unspecified place in other non-institutional residence as the place of occurrence of the external cause: Secondary | ICD-10-CM

## 2014-11-04 DIAGNOSIS — S62615A Displaced fracture of proximal phalanx of left ring finger, initial encounter for closed fracture: Secondary | ICD-10-CM | POA: Insufficient documentation

## 2014-11-04 DIAGNOSIS — S62609A Fracture of unspecified phalanx of unspecified finger, initial encounter for closed fracture: Secondary | ICD-10-CM

## 2014-11-04 DIAGNOSIS — Y92009 Unspecified place in unspecified non-institutional (private) residence as the place of occurrence of the external cause: Principal | ICD-10-CM

## 2014-11-04 DIAGNOSIS — M79642 Pain in left hand: Secondary | ICD-10-CM

## 2014-11-04 DIAGNOSIS — E785 Hyperlipidemia, unspecified: Secondary | ICD-10-CM

## 2014-11-04 DIAGNOSIS — W19XXXA Unspecified fall, initial encounter: Secondary | ICD-10-CM

## 2014-11-04 DIAGNOSIS — M7989 Other specified soft tissue disorders: Secondary | ICD-10-CM

## 2014-11-04 DIAGNOSIS — W1830XA Fall on same level, unspecified, initial encounter: Secondary | ICD-10-CM | POA: Insufficient documentation

## 2014-11-04 DIAGNOSIS — E119 Type 2 diabetes mellitus without complications: Secondary | ICD-10-CM

## 2014-11-04 LAB — LIPID PANEL
CHOL/HDL RATIO: 2.7 ratio (ref <=5.0)
Cholesterol: 165 mg/dL (ref 125–200)
HDL: 62 mg/dL (ref >=40)
LDL Cholesterol: 88 mg/dL (ref <130)
TRIGLYCERIDES: 76 mg/dL (ref <150)
VLDL: 15 mg/dL (ref <30)

## 2014-11-04 LAB — GLUCOSE, POCT (MANUAL RESULT ENTRY): POC Glucose: 144 mg/dl — AB (ref 70–99)

## 2014-11-04 MED ORDER — ACETAMINOPHEN-CODEINE #3 300-30 MG PO TABS: 1.0000 | ORAL_TABLET | Freq: Two times a day (BID) | ORAL | Status: DC | PRN

## 2014-11-04 NOTE — Patient Instructions (Signed)
I am sending you over for a x-ray of left hand to check for fractures. I may possibly need to send you to Orthopedics depending on x-ray results

## 2014-11-04 NOTE — Progress Notes (Signed)
Left hand pain, at level 9, described as aching. Patient had a fall 3 weeks ago and has had hand pain since. Left hand is swollen, can not move some of the fingers.   Blood glucose 144.   Patient reports glucometer is not working properly, showing code.   Patient has AV fistula in left arm.

## 2014-11-04 NOTE — Telephone Encounter (Signed)
Spoke with patient's daughter Jae Dire and informed her of the left fourth finger fracture found on x-ray today. I have given her a appointment to [facility] facility on Monday at 2:15pm. I have also explained that if patient is able to make it today Sports Medicine has agreed to splint finger until he is seen on Monday.

## 2014-11-04 NOTE — Progress Notes (Signed)
Patient ID: Michael Dorsey, male   DOB: 07-Oct-1945, 69 y.o.   MRN: [id number]  CC: left hand pain   HPI: Michael Dorsey is a 69 y.o. male here today for a follow up visit of hand pain.  Patient has past medical history of T2DM, HTN, CKD on HD.  Patient reports that he fell 3 weeks ago on a outstretched hand. He states that he was attempting to get off the couch to answer the door and slide off the cough onto his bottom. He reports that for 3 weeks he has been having left hand swelling and pain.  No Known Allergies Past Medical History  Diagnosis Date  . Hypertension   . Diabetes mellitus without complication   . Hypercholesteremia   . Renal disorder     in process of being diagnosed  . Diastolic dysfunction     heart failure with preserved LV function   Current Outpatient Prescriptions on File Prior to Visit  Medication Sig Dispense Refill  . atorvastatin (LIPITOR) 40 MG tablet Take 1 tablet (40 mg total) by mouth daily. 90 tablet 3  . calcitRIOL (ROCALTROL) 0.25 MCG capsule Take 1 capsule (0.25 mcg total) by mouth daily. 30 capsule 1  . carvedilol (COREG) 25 MG tablet Take 1 tablet (25 mg total) by mouth 2 (two) times daily with a meal. 60 tablet 3  . furosemide (LASIX) 80 MG tablet Take 1 tablet (80 mg total) by mouth 3 (three) times daily. Take three times a day per wife 90 tablet 3  . GlucoCom Lancets MISC Check blood sugar TID & QHS 100 each 4  . glucose blood (CHOICE DM FORA G20 TEST STRIPS) test strip Use as instructed 100 each 12  . glucose monitoring kit (FREESTYLE) monitoring kit 1 each by Does not apply route as needed for other. 1 each 0  . hydrALAZINE (APRESOLINE) 50 MG tablet TAKE 1 TABLET BY MOUTH EVERY 8 HOURS. 90 tablet 2  . insulin NPH-regular Human (NOVOLIN 70/30) (70-30) 100 UNIT/ML injection Inject 15 units in AM with breakfast and 18 units with evening meal 10 mL 5  . INSULIN SYRINGE .5CC/28G 28G X 1/2" 0.5 ML MISC Take insulin twice daily for e11.40 100 each  3  . isosorbide mononitrate (IMDUR) 60 MG 24 hr tablet TAKE 1&1/2 TABLETS BY MOUTH DAILY. (DOSE INCREASE) 45 tablet 4  . Lancet Device MISC 1 strip by Does not apply route daily as needed.     . polyethylene glycol (MIRALAX / GLYCOLAX) packet Take 17 g by mouth 2 (two) times daily. (Patient not taking: Reported on 06/22/2014) 14 each 0  . potassium chloride (K-DUR) 10 MEQ tablet Take 1 tablet (10 mEq total) by mouth daily. (Patient not taking: Reported on 10/28/2014) 60 tablet 2   No current facility-administered medications on file prior to visit.   History reviewed. No pertinent family history. History   Social History  . Marital Status: Married    Spouse Name: N/A  . Number of Children: N/A  . Years of Education: N/A   Occupational History  . Not on file.   Social History Main Topics  . Smoking status: Never Smoker   . Smokeless tobacco: Never Used  . Alcohol Use: Yes     Comment: occasional   . Drug Use: No  . Sexual Activity: Not Currently   Other Topics Concern  . Not on file   Social History Narrative    Review of Systems: Other than what is stated in HPI, all  other systems are negative.     Objective:   Filed Vitals:   11/04/14 1041  BP: 153/72  Pulse: 71  Temp: 98.4 F (36.9 C)  Resp: 18    Physical Exam  Constitutional: He is oriented to person, place, and time.  Cardiovascular: Normal rate, regular rhythm and normal heart sounds.   Pulmonary/Chest: Effort normal and breath sounds normal.  Musculoskeletal: He exhibits edema and tenderness.  Very limited ROM on left hand-4th and 5th digits. Moderate swelling. Normal cap refill Normal bilateral hip ROM  Neurological: He is alert and oriented to person, place, and time.    Lab Results  Component Value Date   WBC 3.6* 03/17/2014   HGB 10.3* 03/17/2014   HCT 31.0* 03/17/2014   MCV 81.2 03/17/2014   PLT 211 03/17/2014   Lab Results  Component Value Date   CREATININE 6.22* 10/28/2014   BUN 50*  10/28/2014   NA 135 10/28/2014   K 4.2 10/28/2014   CL 92* 10/28/2014   CO2 32* 10/28/2014    Lab Results  Component Value Date   HGBA1C 10.20 10/28/2014   Lipid Panel  No results found for: CHOL, TRIG, HDL, CHOLHDL, VLDL, LDLCALC     Assessment and plan:   Zalyn was seen today for hand pain.  Diagnoses and all orders for this visit:  Fall at home, initial encounter Not due to loss of balance. I do not suspect any fractures other than possible left forth and fifth digits.  Swelling of left hand Orders: -     DG Hand Complete Left; Future If fractured will send patient to Ortho for splinting  Left hand pain -     acetaminophen-codeine (TYLENOL #3) 300-30 MG per tablet; Take 1 tablet by mouth every 12 (twelve) hours as needed for moderate pain. See above  DM II (diabetes mellitus, type II), controlled Orders: -     Glucose (CBG) Stable. Patient reports that his glucometer is not reading. I have asked for him to bring meter back next week for nurse to fix for him  Return if symptoms worsen or fail to improve.       Lance Bosch, [place] [facility] [phone] 11/04/2014, 11:20 AM

## 2014-11-07 ENCOUNTER — Ambulatory Visit (INDEPENDENT_AMBULATORY_CARE_PROVIDER_SITE_OTHER): Payer: No Typology Code available for payment source | Admitting: Family Medicine

## 2014-11-07 ENCOUNTER — Telehealth: Payer: Self-pay

## 2014-11-07 VITALS — BP 138/73 | Ht 70.0 in | Wt 176.0 lb

## 2014-11-07 DIAGNOSIS — S62609A Fracture of unspecified phalanx of unspecified finger, initial encounter for closed fracture: Secondary | ICD-10-CM

## 2014-11-07 NOTE — Telephone Encounter (Signed)
-----   Message from Ambrose Finland, NP sent at 11/07/2014  3:04 PM EDT ----- Normal lipid panel. Great, continue on medication

## 2014-11-07 NOTE — Telephone Encounter (Signed)
Patient not available Left message on voice mail to return our call 

## 2014-11-08 ENCOUNTER — Encounter: Payer: Self-pay | Admitting: Pharmacist

## 2014-11-08 NOTE — Progress Notes (Signed)
   Subjective:    Patient ID: Michael Dorsey, male    DOB: 08-19-1945, 69 y.o.   MRN: [id number]  HPI Patient is here with his daughter today. He was seen on Friday of last week and evaluated for finger pain. X-rays showed proximal left fourth phalanx fracture. He is back today to be fitted for a brace and further evaluation and management. He's continued to have pain although it's not debilitating. Says he does not need any pain medicine. He still having some mild swelling but it's improving.   Review of Systems No new symptoms. No fever.    Objective:   Physical Exam  Vital signs are reviewed GEN.: Well-developed overweight male no acute distress HAND: Left. Fourth and fifth finger are mildly swollen. Flexion limited by pain at the MCP, PIP, DIP of the on the left. ring finger. There is no skin ecchymoses or lesion. He has normal temperature and capillary refill of all of his fingers the left hand.  X-rays reveal a mildly displaced proximal phalanx fracture of the left ring finger. PROCEDURE: I placed him in a small gutter splint for the fourth and fifth digits which we fashioned from prefabricated cast material. We used an Ace bandage to apply this. I'll see him back in 2 or 3 weeks. We discussed care of the splint including not getting it wet. His daughters: Examined everyday to make sure he is not getting any new problems such as finger swelling or skin maceration. If they have any issues in the interim though gets a call or come back sooner.       Assessment & Plan:

## 2014-11-15 ENCOUNTER — Encounter: Payer: Self-pay | Admitting: Pharmacist

## 2014-11-21 ENCOUNTER — Ambulatory Visit: Payer: Self-pay | Admitting: Sports Medicine

## 2014-11-22 ENCOUNTER — Ambulatory Visit: Payer: Self-pay | Attending: Internal Medicine | Admitting: Pharmacist

## 2014-11-22 DIAGNOSIS — E1165 Type 2 diabetes mellitus with hyperglycemia: Secondary | ICD-10-CM | POA: Insufficient documentation

## 2014-11-22 DIAGNOSIS — E119 Type 2 diabetes mellitus without complications: Secondary | ICD-10-CM

## 2014-11-22 DIAGNOSIS — E1140 Type 2 diabetes mellitus with diabetic neuropathy, unspecified: Secondary | ICD-10-CM | POA: Insufficient documentation

## 2014-11-22 DIAGNOSIS — Z794 Long term (current) use of insulin: Secondary | ICD-10-CM | POA: Insufficient documentation

## 2014-11-22 NOTE — Patient Instructions (Signed)
Please alternate taking your blood sugars before breakfast, lunch, dinner and at bedtime on the blod glucose log provided. Please continue taking your medications. Thank you for coming in today.

## 2014-11-22 NOTE — Progress Notes (Signed)
   S:    Patient arrives in good spirits with his daughter.  Presents for diabetes evaluation  No blood glucose meter today or CBG log.   Pt reports monitoring BG ACB. Pt reports most values ~160-165.  Reports lowest value of 95 and high of 165.   Patient reports adherence with medications. Current diabetes medications include: Novolin 70/30 15 units BID.   Patient denies hypoglycemic events.  Patient reported dietary habits: Breakfast: oatmeal, egg, bread  Lunch: White Rice, tomato sauce, chicken Dinner: Plantain with dough  Patient reported exercise habits: Hasnt had exercise lately because of knee problems.    Patient reports nocturia which has increased recently within the past week. Patient reports neuropathy in both feet. Patient denies visual changes.    O:  . Lab Results  Component Value Date   HGBA1C 10.20 10/28/2014      A/P: Diabetes currently uncontrolled with pt reported SMBG ACB ~160-165 on average.  A1C above goal on 10/28/14 at 10.2%.   denies hypoglycemic events and is able to verbalize appropriate hypoglycemia management plan after counseling.  reports adherence with medication.  Pt did not bring SMBG log or meter to clinic today.  BG control throughout the day is unknown.  Will continue current dose of Novolin 70/30 15 units BID.  Provided pt with CBG log and highlighted for pt to check BG daily alternating ACB/L/S and QHS.     Next A1C anticipated 11/16.  Written patient instructions provided.  Follow up in Pharmacist Clinic Visit 12/06/14.   Total time in face to face counseling 30 minutes.  Patient seen with Hazle Nordmann, PharmD, Pharmacy Resident.

## 2014-11-23 ENCOUNTER — Ambulatory Visit: Payer: Self-pay | Admitting: *Deleted

## 2014-11-25 ENCOUNTER — Ambulatory Visit (INDEPENDENT_AMBULATORY_CARE_PROVIDER_SITE_OTHER): Payer: Self-pay | Admitting: Family Medicine

## 2014-11-25 ENCOUNTER — Ambulatory Visit (HOSPITAL_COMMUNITY)
Admission: RE | Admit: 2014-11-25 | Discharge: 2014-11-25 | Disposition: A | Discharge status: Home / Self Care | Payer: Self-pay | Source: Ambulatory Visit | Attending: Family Medicine | Admitting: Family Medicine

## 2014-11-25 ENCOUNTER — Encounter: Payer: Self-pay | Admitting: Family Medicine

## 2014-11-25 VITALS — BP 150/76 | Ht 70.0 in | Wt 176.0 lb

## 2014-11-25 DIAGNOSIS — S62609G Fracture of unspecified phalanx of unspecified finger, subsequent encounter for fracture with delayed healing: Secondary | ICD-10-CM

## 2014-11-25 DIAGNOSIS — X58XXXD Exposure to other specified factors, subsequent encounter: Secondary | ICD-10-CM | POA: Insufficient documentation

## 2014-11-25 DIAGNOSIS — S62615D Displaced fracture of proximal phalanx of left ring finger, subsequent encounter for fracture with routine healing: Secondary | ICD-10-CM | POA: Insufficient documentation

## 2014-11-25 DIAGNOSIS — S62609A Fracture of unspecified phalanx of unspecified finger, initial encounter for closed fracture: Secondary | ICD-10-CM | POA: Insufficient documentation

## 2014-11-25 NOTE — Patient Instructions (Signed)
At your convenience either today or next week, please go by outpatient radiology at [facility] and get repeat films of your hand. I will send you a note about those in the mail unless there is something unexpected, in which case I would call you. I have placed in the splint. I want you to start using and moving the fingers is much as possible. Please come out of the splint 2 or 3 times a day and do some flexing and extending of the hand. We do not have to wear the splint all of the time. You dionot have to wear it to sleep. I'd like to see you back in 3 weeks. Continue to take Tylenol for pain.

## 2014-11-25 NOTE — Progress Notes (Signed)
Patient ID: Michael Dorsey, male   DOB: December 29, 1945, 69 y.o.   MRN: [id number]  CC: follow up for fracture of proximal fourth phalanx.  Patient is here with his daughter today. He was seen on 11/08/14 and put in a small gutter splint. He notes he is continuing to have off and on 5/10 in the area of fracture and has been taking extra strength tylenol as needed for pain.  He states that the pain comes randomly and is sometimes also in his thumb, first two digits, and up into his wrist.  Also complaining of numbness in his first two fingers, though he is pretty sure this was present before the fracture.   BP 150/76 mmHg  Ht  (1.778 m)  Wt 176 lb (79.833 kg)  BMI 25.25 kg/m2 Vital signs are reviewed GEN.: Well-developed overweight male no acute distress HAND: Left. Dorsum of hand is mildly swollen. Splint in place on initial exam. On removal of the splint, Fourth and fifth finger are mildly swollen. Flexion limited by pain at the MCP, PIP, DIP of the on the left. ring finger. There is no skin ecchymoses or lesion. He has normal temperature and capillary refill of all of his fingers the left hand.  Assessment:  69 year old man here for follow up for fracture of left proximal fourth phalanx, placed in gutter splint last time. It appears to have been fractured around the second week of July, making this about the fifth or sixth week since fracture. He has been in his current splint for about 2 and a half weeks.   PLAN:  Will repeat x-ray. I fashion the new splint to give him more finger movement. Like him to come out of that and try flexing and extending several times a day. Follow-up 3 weeks.

## 2014-11-28 ENCOUNTER — Encounter: Payer: Self-pay | Admitting: Family Medicine

## 2014-12-06 ENCOUNTER — Other Ambulatory Visit: Payer: Self-pay | Admitting: Internal Medicine

## 2014-12-06 ENCOUNTER — Encounter: Payer: Self-pay | Admitting: Pharmacist

## 2014-12-16 ENCOUNTER — Ambulatory Visit (INDEPENDENT_AMBULATORY_CARE_PROVIDER_SITE_OTHER): Payer: Self-pay | Admitting: Family Medicine

## 2014-12-16 ENCOUNTER — Encounter: Payer: Self-pay | Admitting: Family Medicine

## 2014-12-16 VITALS — BP 145/82 | Ht 70.0 in | Wt 176.0 lb

## 2014-12-16 DIAGNOSIS — S62605S Fracture of unspecified phalanx of left ring finger, sequela: Secondary | ICD-10-CM

## 2014-12-16 NOTE — Progress Notes (Signed)
Patient ID: Michael Dorsey, male   DOB: 31-Jan-1946, 69 y.o.   MRN: [id number]  CC: follow up for fracture of proximal fourth phalanx.  Patient is here with his daughter today. He was seen on 11/08/14 and put in a small gutter splint. He notes he is continuing to have off and on 5/10 in the area of fracture and has been taking extra strength tylenol as needed for pain.  He states that the pain comes randomly and is sometimes also in his thumb, first two digits, and up into his wrist.  Also complaining of numbness in his first two fingers, though he is pretty sure this was present before the fracture.  Pt seen on 11/25/14 in which he was continued in ulnar gutter splint with increased movement.  Since that point, his pain has much improved but his finger motion has not increased.  DOI was approximately 10/19/14   BP 145/82 mmHg  Ht  (1.778 m)  Wt 176 lb (79.833 kg)  BMI 25.25 kg/m2 Vital signs are reviewed GEN.: Well-developed overweight male no acute distress HAND: Left. Dorsum of hand is mildly swollen. Splint in place on initial exam. On removal of the splint, Fourth and fifth finger w/o edema. Flexion limited by pain at the MCP, PIP, DIP of the on the left. ring finger. There is no skin ecchymoses or lesion. He has normal temperature and capillary refill of all of his fingers the left hand.  Assessment:  69 year old man here for follow up for fracture of left proximal fourth phalanx  PLAN:  At this point, recommend exercise with ball squeeze to increase his F/E of the 4th digit.  X-ray from 8/26 did not reveal a whole lot of callous formation. Do not expect this to change a whole lot as he is severely demented and low functional capacity.  F/U in 6 weeks and can consider x-ray at that time however, most likely will take 6-12 months to completely heal.  Consider  starting on Vit D/Ca as well.

## 2014-12-16 NOTE — Progress Notes (Signed)
Patient ID: Michael Dorsey, male   DOB: 1945-07-20, 69 y.o.   MRN: [id number] [facility]: Attending Note: I have reviewed the chart, discussed wit the Sports Medicine Fellow. I agree with assessment and treatment plan as detailed in the Fellow's note. I spoke at length with his daughter. He continues to have quite a bit of loss of flexion extension that hand but the pain seems to be much better. The overall goals to have his pain significantly improved in his function at least partially returned. I don't think a follow-up x-rays, be helpful as a don't think he is a surgical candidate. We gave him a squishy ball today to do some flexion extension exercises with. I'll see him back in 6 weeks. At that time we can decide if we want to do another x-ray, although I don't think it will necessarily be meaningful, it might be helpful to his daughter.

## 2014-12-19 ENCOUNTER — Ambulatory Visit: Payer: Self-pay

## 2014-12-21 ENCOUNTER — Ambulatory Visit: Payer: Self-pay | Attending: Internal Medicine

## 2015-01-27 ENCOUNTER — Ambulatory Visit (HOSPITAL_COMMUNITY)
Admission: RE | Admit: 2015-01-27 | Discharge: 2015-01-27 | Disposition: A | Discharge status: Home / Self Care | Payer: Self-pay | Source: Ambulatory Visit | Attending: Family Medicine | Admitting: Family Medicine

## 2015-01-27 ENCOUNTER — Ambulatory Visit: Payer: Self-pay | Admitting: Family Medicine

## 2015-01-27 DIAGNOSIS — S62605S Fracture of unspecified phalanx of left ring finger, sequela: Secondary | ICD-10-CM

## 2015-01-27 DIAGNOSIS — M19032 Primary osteoarthritis, left wrist: Secondary | ICD-10-CM | POA: Insufficient documentation

## 2015-01-27 DIAGNOSIS — X58XXXD Exposure to other specified factors, subsequent encounter: Secondary | ICD-10-CM | POA: Insufficient documentation

## 2015-01-27 DIAGNOSIS — S62615D Displaced fracture of proximal phalanx of left ring finger, subsequent encounter for fracture with routine healing: Secondary | ICD-10-CM | POA: Insufficient documentation

## 2015-02-03 ENCOUNTER — Ambulatory Visit (INDEPENDENT_AMBULATORY_CARE_PROVIDER_SITE_OTHER): Payer: No Typology Code available for payment source | Admitting: Family Medicine

## 2015-02-03 ENCOUNTER — Encounter: Payer: Self-pay | Admitting: Family Medicine

## 2015-02-03 VITALS — BP 138/71 | Ht 70.0 in | Wt 176.0 lb

## 2015-02-03 DIAGNOSIS — S62609G Fracture of unspecified phalanx of unspecified finger, subsequent encounter for fracture with delayed healing: Secondary | ICD-10-CM

## 2015-02-04 NOTE — Assessment & Plan Note (Signed)
XR shows early healing. Discussed this with patient and family.  No pain at rest. Discussed continuing Rom exercises.  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  Left hand pain is clearly multifactorial at this point.   - At this point, continue to recommend exercise with ball squeeze to increase his F/E of the 4th digit. - Taking Cal, Vit D - F/U in 8 weeks

## 2015-02-04 NOTE — Progress Notes (Signed)
Sahaj Bona - 69 y.o. male MRN [id number]  Date of birth: Jan 12, 1946  CC: f/u fracture of proximal fourth phalynx fracture  SUBJECTIVE:   HPI  - Patient is again joined by his daughter today.  - Last seen on 12/16/14 - Over the last 7 weeks he is doing slightly better.  - He still has limited RoM in the left hand. This is again slightly improved.  - He is unable to quantify his level of pain. He still has pain in his thumb and wrist that he reports has been present for many years.   --No pain at rest - He feels like all of his fingers are numb - He is taking Calcium/vitamin D  ROS:     10 point RoS negative other than that listed in HPI above.   HISTORY: Past Medical, Surgical, Social, and Family History Reviewed & Updated per EMR.    XR from 01/25/2015 reviews: Agree with interval healing of the fracture involving the base of the proximal phalanx of the ring fingers.  healing is not yet complete.  OBJECTIVE: BP 138/71 mmHg  Ht 5' 10"  (1.778 m)  Wt 176 lb (79.833 kg)  BMI 25.25 kg/m2  Physical Exam  GEN.: Well-developed overweight male no acute distress HAND: Left. Dorsum of hand is no longerswollen.  Flexion limited by pain at the MCP, PIP, DIP of the on the left 4th and 5th digits. He has atrophy and his Thenar eminence, but no hypothenar eminence atrophy. Sensation intact/symmetric throughout the right hand. There is no skin ecchymoses or lesion. He has normal temperature and capillary refill of all of his fingers the left hand.  MEDICATIONS, LABS & OTHER ORDERS: Previous Medications   ACETAMINOPHEN-CODEINE (TYLENOL #3) 300-30 MG PER TABLET    Take 1 tablet by mouth every 12 (twelve) hours as needed for moderate pain.   ATORVASTATIN (LIPITOR) 40 MG TABLET    Take 1 tablet (40 mg total) by mouth daily.   CALCITRIOL (ROCALTROL) 0.25 MCG CAPSULE    TAKE 1 CAPSULE BY MOUTH DAILY.   CALCIUM ACETATE (PHOSLO) 667 MG CAPSULE       CARVEDILOL (COREG) 25 MG TABLET    Take 1  tablet (25 mg total) by mouth 2 (two) times daily with a meal.   FUROSEMIDE (LASIX) 80 MG TABLET    Take 1 tablet (80 mg total) by mouth 3 (three) times daily. Take three times a day per wife   GLUCOCOM LANCETS MISC    Check blood sugar TID & QHS   GLUCOSE BLOOD (CHOICE DM FORA G20 TEST STRIPS) TEST STRIP    Use as instructed   GLUCOSE MONITORING KIT (FREESTYLE) MONITORING KIT    1 each by Does not apply route as needed for other.   HYDRALAZINE (APRESOLINE) 50 MG TABLET    TAKE 1 TABLET BY MOUTH EVERY 8 HOURS.   INSULIN NPH-REGULAR HUMAN (NOVOLIN 70/30) (70-30) 100 UNIT/ML INJECTION    Inject 15 units in AM with breakfast and 18 units with evening meal   INSULIN SYRINGE .5CC/28G 28G X 1/2" 0.5 ML MISC    Take insulin twice daily for e11.40   ISOSORBIDE MONONITRATE (IMDUR) 60 MG 24 HR TABLET    TAKE 1&1/2 TABLETS BY MOUTH DAILY. (DOSE INCREASE)   LANCET DEVICE MISC    1 strip by Does not apply route daily as needed.    POLYETHYLENE GLYCOL (MIRALAX / GLYCOLAX) PACKET    Take 17 g by mouth 2 (two) times daily.  POTASSIUM CHLORIDE (K-DUR) 10 MEQ TABLET    Take 1 tablet (10 mEq total) by mouth daily.   Modified Medications   No medications on file   New Prescriptions   No medications on file   Discontinued Medications   No medications on file  No orders of the defined types were placed in this encounter.   ASSESSMENT & PLAN: See problem based charting & AVS for pt instructions.

## 2015-02-28 ENCOUNTER — Other Ambulatory Visit: Payer: Self-pay | Admitting: Internal Medicine

## 2015-05-05 MED FILL — ?FUROSEMIDE 80MG TABLET: 80 | 30 days supply | Qty: 90 | Fill #0

## 2015-05-05 MED FILL — ISOSORBIDE MN ER 60 MG TAB: 60 | 30 days supply | Qty: 45 | Fill #4

## 2015-05-05 MED FILL — NOVOLIN 70/30 100 UNITS/ML: (70-30) 100 | 30 days supply | Qty: 10 | Fill #2

## 2015-05-05 MED FILL — CALCITRIOL 0.25 MCG CAPSULE: 0.25 | 30 days supply | Qty: 30 | Fill #2

## 2015-05-05 MED FILL — hydrALAZINE HCL 50 MG TABS: 50 | 30 days supply | Qty: 90 | Fill #0

## 2015-05-12 ENCOUNTER — Other Ambulatory Visit: Payer: Self-pay | Admitting: Pharmacist

## 2015-05-12 MED ORDER — TRUEPLUS LANCETS 28G MISC: Status: AC

## 2015-05-12 MED ORDER — TRUE METRIX METER W/DEVICE KIT: PACK | Status: AC

## 2015-05-12 MED ORDER — GLUCOSE BLOOD VI STRP: ORAL_STRIP | Status: DC

## 2015-05-12 MED FILL — !TRUE METRIX BLOOD GLUCOSE: 365 days supply | Qty: 1 | Fill #0

## 2015-05-12 MED FILL — TRUEplus LANCETS 28G MISC: 25 days supply | Qty: 100 | Fill #0

## 2015-05-12 MED FILL — TRUE METRIX TEST STRIP: 25 days supply | Qty: 100 | Fill #0

## 2015-05-26 ENCOUNTER — Other Ambulatory Visit: Payer: Self-pay | Admitting: Internal Medicine

## 2015-05-29 MED FILL — CARVEDILOL 25 MG TABLET: 25 | 30 days supply | Qty: 60 | Fill #0

## 2015-06-09 ENCOUNTER — Other Ambulatory Visit: Payer: Self-pay | Admitting: Internal Medicine

## 2015-06-09 MED FILL — NOVOLIN 70/30 100 UNITS/ML: (70-30) 100 | 30 days supply | Qty: 10 | Fill #3

## 2015-06-12 ENCOUNTER — Other Ambulatory Visit: Payer: Self-pay | Admitting: Internal Medicine

## 2015-07-04 MED FILL — ISOSORBIDE MN ER 60 MG TAB: 60 | 30 days supply | Qty: 45 | Fill #0

## 2015-07-04 MED FILL — ?CARVEDILOL 25 MG TABLET: 25 | 30 days supply | Qty: 60 | Fill #0

## 2015-07-04 MED FILL — CALCITRIOL 0.25 MCG CAPSULE: 0.25 | 30 days supply | Qty: 30 | Fill #0

## 2015-07-11 ENCOUNTER — Other Ambulatory Visit: Payer: Self-pay | Admitting: Internal Medicine

## 2015-07-11 MED FILL — NOVOLIN 70/30 100 UNITS/ML: (70-30) 100 | 30 days supply | Qty: 10 | Fill #0

## 2015-07-13 ENCOUNTER — Encounter: Payer: Self-pay | Admitting: Family Medicine

## 2015-07-13 ENCOUNTER — Ambulatory Visit: Payer: Self-pay | Attending: Family Medicine | Admitting: Family Medicine

## 2015-07-13 VITALS — BP 129/71 | HR 76 | Temp 97.7°F | Resp 18 | Ht 69.0 in | Wt 169.0 lb

## 2015-07-13 DIAGNOSIS — N185 Chronic kidney disease, stage 5: Secondary | ICD-10-CM | POA: Insufficient documentation

## 2015-07-13 DIAGNOSIS — Z794 Long term (current) use of insulin: Secondary | ICD-10-CM

## 2015-07-13 DIAGNOSIS — I120 Hypertensive chronic kidney disease with stage 5 chronic kidney disease or end stage renal disease: Secondary | ICD-10-CM

## 2015-07-13 DIAGNOSIS — N186 End stage renal disease: Secondary | ICD-10-CM

## 2015-07-13 DIAGNOSIS — Z992 Dependence on renal dialysis: Secondary | ICD-10-CM

## 2015-07-13 DIAGNOSIS — E785 Hyperlipidemia, unspecified: Secondary | ICD-10-CM

## 2015-07-13 DIAGNOSIS — Z9119 Patient's noncompliance with other medical treatment and regimen: Secondary | ICD-10-CM | POA: Insufficient documentation

## 2015-07-13 DIAGNOSIS — E1122 Type 2 diabetes mellitus with diabetic chronic kidney disease: Secondary | ICD-10-CM

## 2015-07-13 LAB — POCT GLYCOSYLATED HEMOGLOBIN (HGB A1C): HEMOGLOBIN A1C: 11.8

## 2015-07-13 LAB — GLUCOSE, POCT (MANUAL RESULT ENTRY): POC GLUCOSE: 99 mg/dL (ref 70–99)

## 2015-07-13 MED ORDER — CARVEDILOL 25 MG PO TABS: 25.0000 mg | ORAL_TABLET | Freq: Two times a day (BID) | ORAL | Status: DC

## 2015-07-13 MED ORDER — CALCIUM ACETATE (PHOS BINDER) 667 MG PO CAPS: 667.0000 mg | ORAL_CAPSULE | Freq: Two times a day (BID) | ORAL | Status: DC

## 2015-07-13 MED ORDER — INSULIN NPH ISOPHANE & REGULAR (70-30) 100 UNIT/ML ~~LOC~~ SUSP: 20.0000 [IU] | Freq: Two times a day (BID) | SUBCUTANEOUS | Status: DC

## 2015-07-13 MED ORDER — CALCITRIOL 0.25 MCG PO CAPS: 0.2500 ug | ORAL_CAPSULE | Freq: Every day | ORAL | Status: AC

## 2015-07-13 MED ORDER — HYDRALAZINE HCL 50 MG PO TABS: ORAL_TABLET | ORAL | Status: AC

## 2015-07-13 MED ORDER — ISOSORBIDE MONONITRATE ER 60 MG PO TB24: ORAL_TABLET | ORAL | Status: DC

## 2015-07-13 MED ORDER — ATORVASTATIN CALCIUM 40 MG PO TABS: 40.0000 mg | ORAL_TABLET | Freq: Every day | ORAL | Status: AC

## 2015-07-13 NOTE — Progress Notes (Signed)
Patient here for diabetes mellitus follow-up Patient indicates he has not checked blood sugar in about a week. Needs refill of calcium acetate CBG 99 A1c-11.8

## 2015-07-13 NOTE — Progress Notes (Signed)
Subjective:  Patient ID: Michael Dorsey, male    DOB: [birth year]  Age: 70 y.o. MRN: [id number]  CC: Diabetes   HPI Josejulian Tarango is a 70 year old male with a history of hypertension, type 2 diabetes mellitus (A1c 11.8, end-stage renal disease on hemodialysis, hyperlipidemia who was last seen in the clinic a year ago and comes in today for a follow-up visit.  He has been compliant with his dialysis appointments but has not been compliant with a diabetic diet and has been eating a lot of sweets. He informs me his sugars have been controlled at home however he is unable to give me any numbers from his readings. He has not had a recent eye exam. Informs me he increased his Novolin 70/30 from 15 units twice daily to 20 units twice daily given he had not been compliant with his office visits.  Denies chest pain, shortness of breath and has no other complaints at this time.  Outpatient Prescriptions Prior to Visit  Medication Sig Dispense Refill  . acetaminophen-codeine (TYLENOL #3) 300-30 MG per tablet Take 1 tablet by mouth every 12 (twelve) hours as needed for moderate pain. 30 tablet 0  . Blood Glucose Monitoring Suppl (TRUE METRIX METER) w/Device KIT USE AS INSTRUCTED 1 kit 0  . furosemide (LASIX) 80 MG tablet TAKE 1 TABLET BY MOUTH 3 TIMES DAILY. 90 tablet 3  . glucose blood (TRUE METRIX BLOOD GLUCOSE TEST) test strip Use as instructed 100 each 12  . INSULIN SYRINGE .5CC/28G 28G X 1/2" 0.5 ML MISC Take insulin twice daily for e11.40 100 each 3  . Lancet Device MISC 1 strip by Does not apply route daily as needed.     . TRUEPLUS LANCETS 28G MISC USE AS INSTRUCTED 100 each 5  . atorvastatin (LIPITOR) 40 MG tablet Take 1 tablet (40 mg total) by mouth daily. 90 tablet 3  . calcitRIOL (ROCALTROL) 0.25 MCG capsule TAKE 1 CAPSULE BY MOUTH DAILY. 30 capsule 0  . calcium acetate (PHOSLO) 667 MG capsule Reported on 07/13/2015  11  . carvedilol (COREG) 25 MG tablet TAKE 1 TABLET BY MOUTH 2  TIMES DAILY WITH A MEAL. MUST HAVE OFFICE VISIT FOR REFILLS 60 tablet 0  . hydrALAZINE (APRESOLINE) 50 MG tablet TAKE 1 TABLET BY MOUTH EVERY 8 HOURS. 90 tablet 2  . insulin NPH-regular Human (NOVOLIN 70/30) (70-30) 100 UNIT/ML injection Inject 15 Units into the skin 2 (two) times daily with a meal. Must have office visit for refills 10 mL 0  . isosorbide mononitrate (IMDUR) 60 MG 24 hr tablet Take 1 & 1/2 tablets by mouth daily. Must have office visit for more refills 45 tablet 0  . polyethylene glycol (MIRALAX / GLYCOLAX) packet Take 17 g by mouth 2 (two) times daily. (Patient not taking: Reported on 07/13/2015) 14 each 0  . potassium chloride (K-DUR) 10 MEQ tablet Take 1 tablet (10 mEq total) by mouth daily. (Patient not taking: Reported on 10/28/2014) 60 tablet 2   No facility-administered medications prior to visit.    ROS Review of Systems  Constitutional: Negative for activity change and appetite change.  HENT: Negative for sinus pressure and sore throat.   Eyes: Negative for visual disturbance.  Respiratory: Negative for cough, chest tightness and shortness of breath.   Cardiovascular: Negative for chest pain and leg swelling.  Gastrointestinal: Negative for abdominal pain, diarrhea, constipation and abdominal distention.  Endocrine: Negative.   Genitourinary: Negative for dysuria.  Musculoskeletal: Negative for myalgias and joint swelling.  Skin: Negative for rash.  Allergic/Immunologic: Negative.   Neurological: Negative for weakness, light-headedness and numbness.  Psychiatric/Behavioral: Negative for suicidal ideas and dysphoric mood.    Objective:  BP 129/71 mmHg  Pulse 76  Temp(Src) 97.7 F (36.5 C) (Oral)  Resp 18  Ht 5' 9" (1.753 m)  Wt 169 lb (76.658 kg)  BMI 24.95 kg/m2  SpO2 100%  BP/Weight 07/13/2015 02/03/2015 5/46/5681  Systolic BP 275 170 017  Diastolic BP 71 71 82  Wt. (Lbs) 169 176 176  BMI 24.95 25.25 25.25      Physical Exam  Constitutional: He  is oriented to person, place, and time. He appears well-developed and well-nourished.  Cardiovascular: Normal rate, normal heart sounds and intact distal pulses.   No murmur heard. Pulmonary/Chest: Effort normal and breath sounds normal. He has no wheezes. He has no rales. He exhibits no tenderness.  Abdominal: Soft. Bowel sounds are normal. He exhibits no distension and no mass. There is no tenderness.  Reducible umbilical hernia  Musculoskeletal: Normal range of motion.  Neurological: He is alert and oriented to person, place, and time.  Skin: Skin is warm and dry.    Lab Results  Component Value Date   HGBA1C 11.8 07/13/2015    Assessment & Plan:   1. Type 2 diabetes mellitus with chronic kidney disease on chronic dialysis, with long-term current use of insulin (HCC) Uncontrolled with A1c of 11.8 Patient reports that blood sugar log at home are controlled He has been advised to keep a blood sugar log which will be reviewed at his next office visit after which I will decide on his dose of Novolin 70/30. Keep blood sugar logs with fasting goals of 80-120 mg/dl, random of less than 180 and in the event of sugars less than 60 mg/dl or greater than 400 mg/dl please notify the clinic ASAP. It is recommended that you undergo annual eye exams and annual foot exams. Pneumovax is recommended every 5 years before the age of 61 and once for a lifetime at or after the age of 84. - Glucose (CBG) - HgB A1c - insulin NPH-regular Human (NOVOLIN 70/30) (70-30) 100 UNIT/ML injection; Inject 20 Units into the skin 2 (two) times daily with a meal. Must have office visit for refills  Dispense: 10 mL; Refill: 3  2. Hypertension, renal disease, stage 5 chronic kidney disease or end stage renal disease (HCC) Controlled - carvedilol (COREG) 25 MG tablet; Take 1 tablet (25 mg total) by mouth 2 (two) times daily with a meal.  Dispense: 60 tablet; Refill: 3 - isosorbide mononitrate (IMDUR) 60 MG 24 hr tablet;  Take 1 & 1/2 tablets by mouth daily.  Dispense: 45 tablet; Refill: 3 - hydrALAZINE (APRESOLINE) 50 MG tablet; TAKE 1 TABLET BY MOUTH EVERY 8 HOURS.  Dispense: 90 tablet; Refill: 3  3. Hyperlipidemia No recent lipid panel We'll addressed at the next visit - atorvastatin (LIPITOR) 40 MG tablet; Take 1 tablet (40 mg total) by mouth daily.  Dispense: 30 tablet; Refill: 3  4. ESRD on hemodialysis [facility]) Patient is on furosemide and I have asked him to clarify this with his nephrologist as I do not expect him to be on it. Hemodialysis as per protocol - calcitRIOL (ROCALTROL) 0.25 MCG capsule; Take 1 capsule (0.25 mcg total) by mouth daily.  Dispense: 30 capsule; Refill: 3 - calcium acetate (PHOSLO) 667 MG capsule; Take 1 capsule (667 mg total) by mouth 2 (two) times daily. Reported on 07/13/2015  Dispense: 60 capsule;  Refill: 3   Meds ordered this encounter  Medications  . carvedilol (COREG) 25 MG tablet    Sig: Take 1 tablet (25 mg total) by mouth 2 (two) times daily with a meal.    Dispense:  60 tablet    Refill:  3  . atorvastatin (LIPITOR) 40 MG tablet    Sig: Take 1 tablet (40 mg total) by mouth daily.    Dispense:  30 tablet    Refill:  3  . insulin NPH-regular Human (NOVOLIN 70/30) (70-30) 100 UNIT/ML injection    Sig: Inject 20 Units into the skin 2 (two) times daily with a meal. Must have office visit for refills    Dispense:  10 mL    Refill:  3  . calcitRIOL (ROCALTROL) 0.25 MCG capsule    Sig: Take 1 capsule (0.25 mcg total) by mouth daily.    Dispense:  30 capsule    Refill:  3  . isosorbide mononitrate (IMDUR) 60 MG 24 hr tablet    Sig: Take 1 & 1/2 tablets by mouth daily.    Dispense:  45 tablet    Refill:  3  . hydrALAZINE (APRESOLINE) 50 MG tablet    Sig: TAKE 1 TABLET BY MOUTH EVERY 8 HOURS.    Dispense:  90 tablet    Refill:  3  . calcium acetate (PHOSLO) 667 MG capsule    Sig: Take 1 capsule (667 mg total) by mouth 2 (two) times daily. Reported on 07/13/2015     Dispense:  60 capsule    Refill:  3    Follow-up: Return in about 3 weeks (around 08/03/2015) for Follow-up on diabetes mellitus.   Arnoldo Morale MD

## 2015-08-03 MED FILL — CALCIUM ACETATE 667 MG CAP: 667 | 30 days supply | Qty: 60 | Fill #0

## 2015-08-03 MED FILL — FUROSEMIDE 80 MG TABLET: 80 | 30 days supply | Qty: 90 | Fill #1

## 2015-08-18 MED FILL — NOVOLIN 70/30 100 UNITS/ML: (70-30) 100 | 30 days supply | Qty: 10 | Fill #0

## 2015-09-08 MED FILL — FUROSEMIDE 80 MG TABLET: 80 | 30 days supply | Qty: 90 | Fill #2

## 2015-09-08 MED FILL — hydrALAZINE HCL 50 MG TABS: 50 | 30 days supply | Qty: 90 | Fill #0

## 2015-09-08 MED FILL — CALCIUM ACETATE 667 MG CAP: 667 | 30 days supply | Qty: 60 | Fill #1

## 2015-09-08 MED FILL — ?ATORVASTATIN 40MG TABLET: 40 | 30 days supply | Qty: 30 | Fill #0

## 2015-09-08 MED FILL — !NOVOLIN 70/30 100 UNITS/ML: (70-30) 100 | 30 days supply | Qty: 10 | Fill #1

## 2015-09-08 MED FILL — ISOSORBIDE MN ER 60 MG TAB: 60 | 30 days supply | Qty: 45 | Fill #0

## 2015-09-14 MED FILL — TRUE METRIX TEST STRIP: 25 days supply | Qty: 100 | Fill #1

## 2015-09-29 MED FILL — CARVEDILOL 25 MG TABLET: 25 | 30 days supply | Qty: 60 | Fill #0

## 2015-10-13 MED FILL — hydrALAZINE HCL 50 MG TABS: 50 | 30 days supply | Qty: 90 | Fill #1

## 2015-10-13 MED FILL — ?ATORVASTATIN 40MG TABLET: 40 | 30 days supply | Qty: 30 | Fill #1

## 2015-10-13 MED FILL — ?FUROSEMIDE 80MG TABLET: 80 | 30 days supply | Qty: 90 | Fill #3

## 2015-10-13 MED FILL — ISOSORBIDE MN ER 60 MG TAB: 60 | 30 days supply | Qty: 45 | Fill #1

## 2015-10-18 MED FILL — !NOVOLIN 70/30 100 UNITS/ML: (70-30) 100 | 30 days supply | Qty: 10 | Fill #2

## 2015-11-10 MED FILL — ISOSORBIDE MN ER 60 MG TAB: 60 | 30 days supply | Qty: 45 | Fill #2

## 2015-11-10 MED FILL — NOVOLIN 70/30 100 UNITS/ML: (70-30) 100 | 30 days supply | Qty: 10 | Fill #3

## 2015-11-10 MED FILL — TRUE METRIX TEST STRIP: 25 days supply | Qty: 100 | Fill #2

## 2015-11-10 MED FILL — ?ATORVASTATIN 40MG TABLET: 40 | 30 days supply | Qty: 30 | Fill #2

## 2015-11-10 MED FILL — hydrALAZINE HCL 50 MG TABS: 50 | 30 days supply | Qty: 90 | Fill #2

## 2015-11-10 MED FILL — CARVEDILOL 25 MG TABLET: 25 | 30 days supply | Qty: 60 | Fill #1

## 2015-11-10 MED FILL — CALCIUM ACETATE 667 MG CAP: 667 | 30 days supply | Qty: 60 | Fill #2

## 2015-12-15 ENCOUNTER — Other Ambulatory Visit: Payer: Self-pay | Admitting: Family Medicine

## 2015-12-15 DIAGNOSIS — Z992 Dependence on renal dialysis: Principal | ICD-10-CM

## 2015-12-15 DIAGNOSIS — N186 End stage renal disease: Principal | ICD-10-CM

## 2015-12-15 DIAGNOSIS — E1122 Type 2 diabetes mellitus with diabetic chronic kidney disease: Secondary | ICD-10-CM

## 2015-12-15 DIAGNOSIS — Z794 Long term (current) use of insulin: Principal | ICD-10-CM

## 2015-12-15 MED FILL — CALCIUM ACETATE 667 MG CAP: 667 | 30 days supply | Qty: 60 | Fill #3

## 2015-12-15 MED FILL — hydrALAZINE HCL 50 MG TABS: 50 | 30 days supply | Qty: 90 | Fill #3

## 2015-12-15 MED FILL — ISOSORBIDE MN ER 60 MG TAB: 60 | 30 days supply | Qty: 45 | Fill #3

## 2015-12-15 MED FILL — ?ATORVASTATIN 40MG TABLET: 40 | 30 days supply | Qty: 30 | Fill #3

## 2015-12-18 MED FILL — NOVOLIN 70/30 100 UNITS/ML: (70-30) 100 | 25 days supply | Qty: 10 | Fill #0

## 2015-12-22 ENCOUNTER — Ambulatory Visit: Payer: Self-pay | Admitting: Vascular Surgery

## 2015-12-22 ENCOUNTER — Encounter: Payer: Self-pay | Admitting: Vascular Surgery

## 2015-12-22 ENCOUNTER — Ambulatory Visit (INDEPENDENT_AMBULATORY_CARE_PROVIDER_SITE_OTHER): Payer: Self-pay | Admitting: Vascular Surgery

## 2015-12-22 ENCOUNTER — Other Ambulatory Visit: Payer: Self-pay

## 2015-12-22 VITALS — BP 130/69 | HR 82 | Temp 97.6°F | Resp 19 | Ht 69.0 in | Wt 172.1 lb

## 2015-12-22 DIAGNOSIS — N186 End stage renal disease: Secondary | ICD-10-CM

## 2015-12-22 NOTE — Progress Notes (Signed)
  Referred by:  Enobong Amao, MD [address] [place], [place] [address]  Reason for referral: bleeding from left BC AVF  History of Present Illness  Michael Dorsey is a 69 y.o. (08/14/1945) male who presents for evaluation L arm fistula bleeding.  Pt was sent here for evaluation of two ulcers overly his L BC AVF created by Dr. Early in 11/10/13.  The pt is unaware of any bleeding episodes or difficulties with flow rates.  Past Medical History:  Diagnosis Date  . Diabetes mellitus without complication (HCC)   . Diastolic dysfunction    heart failure with preserved LV function  . Hypercholesteremia   . Hypertension   . Renal disorder    in process of being diagnosed    Past Surgical History:  Procedure Laterality Date  . AV FISTULA PLACEMENT Left 11/10/2013   Procedure: LEFT ARM  ARTERIOVENOUS (AV) FISTULA CREATION;  Surgeon: Todd F Early, MD;  Location: [facility] OR;  Service: Vascular;  Laterality: Left;  . NEPHRECTOMY     [facility]    Social History   Social History  . Marital status: Married    Spouse name: N/A  . Number of children: N/A  . Years of education: N/A   Occupational History  . Not on file.   Social History Main Topics  . Smoking status: Never Smoker  . Smokeless tobacco: Never Used  . Alcohol use Yes     Comment: occasional   . Drug use: No  . Sexual activity: Not Currently   Other Topics Concern  . Not on file   Social History Narrative  . No narrative on file    Family History: patient is unable to detail the medical history of his parents   Current Outpatient Prescriptions  Medication Sig Dispense Refill  . acetaminophen-codeine (TYLENOL #3) 300-30 MG per tablet Take 1 tablet by mouth every 12 (twelve) hours as needed for moderate pain. 30 tablet 0  . atorvastatin (LIPITOR) 40 MG tablet Take 1 tablet (40 mg total) by mouth daily. 30 tablet 3  . Blood Glucose Monitoring Suppl (TRUE METRIX METER) w/Device KIT USE AS INSTRUCTED 1 kit 0  .  calcitRIOL (ROCALTROL) 0.25 MCG capsule Take 1 capsule (0.25 mcg total) by mouth daily. 30 capsule 3  . calcium acetate (PHOSLO) 667 MG capsule Take 1 capsule (667 mg total) by mouth 2 (two) times daily. Reported on 07/13/2015 60 capsule 3  . carvedilol (COREG) 25 MG tablet Take 1 tablet (25 mg total) by mouth 2 (two) times daily with a meal. 60 tablet 3  . furosemide (LASIX) 80 MG tablet TAKE 1 TABLET BY MOUTH 3 TIMES DAILY. 90 tablet 3  . glucose blood (TRUE METRIX BLOOD GLUCOSE TEST) test strip Use as instructed 100 each 12  . hydrALAZINE (APRESOLINE) 50 MG tablet TAKE 1 TABLET BY MOUTH EVERY 8 HOURS. 90 tablet 3  . insulin NPH-regular Human (NOVOLIN 70/30) (70-30) 100 UNIT/ML injection Inject 20 Units into the skin 2 (two) times daily with a meal. 10 mL 0  . INSULIN SYRINGE .5CC/28G 28G X 1/2" 0.5 ML MISC Take insulin twice daily for e11.40 100 each 3  . isosorbide mononitrate (IMDUR) 60 MG 24 hr tablet Take 1 & 1/2 tablets by mouth daily. 45 tablet 3  . Lancet Device MISC 1 strip by Does not apply route daily as needed.     . polyethylene glycol (MIRALAX / GLYCOLAX) packet Take 17 g by mouth 2 (two) times daily. 14 each   0  . TRUEPLUS LANCETS 28G MISC USE AS INSTRUCTED 100 each 5   No current facility-administered medications for this visit.     No Known Allergies  REVIEW OF SYSTEMS:   Cardiac:  positive for: no symptoms, negative for: Chest pain or chest pressure, Shortness of breath upon exertion and Shortness of breath when lying flat,   Vascular:  positive for: no symptoms,  negative for: Pain in calf, thigh, or hip brought on by ambulation, Pain in feet at night that wakes you up from your sleep, Blood clot in your veins and Leg swelling  Pulmonary:  positive for: no symptoms,  negative for: Oxygen at home, Productive cough and Wheezing  Neurologic:  positive for: Sudden weakness in arms or legs, negative for: Sudden numbness in arms or legs, Sudden onset of difficulty  speaking or slurred speech, Temporary loss of vision in one eye and Problems with dizziness  Gastrointestinal:  positive for: no symptoms, negative for: Blood in stool and Vomited blood  Genitourinary:  positive for: no symptoms, negative for: Burning when urinating and Blood in urine  Psychiatric:  positive for: no symptoms,  negative for: Major depression  Hematologic:  positive for: no symptoms,  negative for: negative for: Bleeding problems and Problems with blood clotting too easily  Dermatologic:  positive for: no symptoms, negative for: Rashes or ulcers  Constitutional:  positive for: no symptoms, negative for: Fever or chills   Physical Examination  Vitals:   12/22/15 1430  BP: 130/69  Pulse: 82  Resp: 19  Temp: 97.6 F (36.4 C)  TempSrc: Oral  SpO2: 97%  Weight: 172 lb 1.6 oz (78.1 kg)  Height: 5' 9" (1.753 m)    Body mass index is 25.41 kg/m.  General: Alert, O x 3, WD,NAD  Head: /AT,   Ear/Nose/Throat: Hearing grossly intact, nares without erythema or drainage, oropharynx without Erythema or Exudate , Mallampati score: 3, Dentition intact  Eyes: PERRLA, EOMI,   Neck: Supple, mid-line trachea,    Pulmonary: Sym exp, good B air movt,CTA B  Cardiac: RRR, Nl S1, S2, no Murmurs, No rubs, No S3,S4  Vascular: Vessel Right Left  Radial Palpable Palpable  Brachial Palpable Palpable  Carotid Palpable, No Bruit Palpable, No Bruit  Aorta Not palpable N/A  Femoral Palpable Palpable  Popliteal Not palpable Not palpable  PT Palpable Palpable  DP Palpable Palpable   Gastrointestinal: soft, non-distended, non-tender to palpation, No guarding or rebound, no HSM, no masses, no CVAT B, No palpable prominent aortic pulse,    Musculoskeletal: M/S 5/5 throughout  , Extremities without ischemic changes, L BC AVF with large pseudoaneurysm in mid-segment with scabbed two ulcer overlying the fistula, strong thrill in distal fistula, more pulsatile  proximally  Neurologic: CN 2-12 intact , Pain and light touch intact in extremities , Motor exam as listed above  Psychiatric: Judgement intact, Mood & affect appropriate for pt's clinical situation  Dermatologic: See M/S exam for extremity exam, No rashes otherwise noted  Lymph : Palpable lymph nodes: None   Medical Decision Making  Michael Dorsey is a 69 y.o. male who presents with ESRD requiring hemodialysis, ulcerated L BC AVF PSA   I recommend: plication of L BC AVF as an attempt to salvage the fistula. Risk, benefits, and alternatives to access surgery were discussed.   The patient is aware the risks include but are not limited to: bleeding, infection, steal syndrome, nerve damage, ischemic monomelic neuropathy, failure to mature, need for additional procedures, death and   stroke.  The patient agrees to proceed forward with the procedure.  The patient has agreed to proceed with the above procedure which will be scheduled Monday 25 SEP 17.   Brian Chen, MD [facility] Office: [phone] Pager: [phone]  12/22/2015, 3:19 PM    

## 2015-12-22 NOTE — Progress Notes (Signed)
I was unable to reach patient by phone.  I left  A message on voice mail.  I instructed the patient to [facility] at 1100   , nothing to eat or drink after midnight.   I instructed the patient to take the following medications in the am with just enough water to get them down:Coreg, Hydralazine, Isosorbide, NO Insulin morning of OR. Take 1/2 of scheduled Insulin the evening before surgery I asked patient to not wear any lotions, powders, cologne, jewelry, piercing, make-up or nail polish.  I asked the patient to call [phone], in the am if there were any questions or problems.  I also instructed patient to check CBG t and if it is less than 70 to treat it with Glucose Gel, Glucose tablets or 1/2 cup of clear juice like apple juice or cranberry juice, or 1/2 cup of regular soda. (not cream soda). I instructed patient to recheck CBG in 15 minutes and if CBG is not greater than 70, to  Call [phone] (pre- op). If it is before pre-op opens to retreat as before and recheck CBG in 15 minutes. I told patient to make note of time that liquid is taken and amount, that surgical time may have to be adjusted.

## 2015-12-25 ENCOUNTER — Ambulatory Visit (HOSPITAL_COMMUNITY): Payer: Self-pay | Admitting: Certified Registered Nurse Anesthetist

## 2015-12-25 ENCOUNTER — Telehealth: Payer: Self-pay | Admitting: Vascular Surgery

## 2015-12-25 ENCOUNTER — Encounter (HOSPITAL_COMMUNITY): Payer: Self-pay | Admitting: *Deleted

## 2015-12-25 ENCOUNTER — Ambulatory Visit (HOSPITAL_COMMUNITY)
Admission: RE | Admit: 2015-12-25 | Discharge: 2015-12-25 | Disposition: A | Discharge status: Home / Self Care | Payer: Self-pay | Source: Ambulatory Visit | Attending: Vascular Surgery | Admitting: Vascular Surgery

## 2015-12-25 ENCOUNTER — Encounter (HOSPITAL_COMMUNITY)
Admission: RE | Disposition: A | Discharge status: Home / Self Care | Payer: Self-pay | Source: Ambulatory Visit | Attending: Vascular Surgery

## 2015-12-25 DIAGNOSIS — E1122 Type 2 diabetes mellitus with diabetic chronic kidney disease: Secondary | ICD-10-CM | POA: Insufficient documentation

## 2015-12-25 DIAGNOSIS — E7800 Pure hypercholesterolemia, unspecified: Secondary | ICD-10-CM | POA: Insufficient documentation

## 2015-12-25 DIAGNOSIS — T82898A Other specified complication of vascular prosthetic devices, implants and grafts, initial encounter: Secondary | ICD-10-CM

## 2015-12-25 DIAGNOSIS — I132 Hypertensive heart and chronic kidney disease with heart failure and with stage 5 chronic kidney disease, or end stage renal disease: Secondary | ICD-10-CM | POA: Insufficient documentation

## 2015-12-25 DIAGNOSIS — Y832 Surgical operation with anastomosis, bypass or graft as the cause of abnormal reaction of the patient, or of later complication, without mention of misadventure at the time of the procedure: Secondary | ICD-10-CM | POA: Insufficient documentation

## 2015-12-25 DIAGNOSIS — Z79899 Other long term (current) drug therapy: Secondary | ICD-10-CM | POA: Insufficient documentation

## 2015-12-25 DIAGNOSIS — N186 End stage renal disease: Secondary | ICD-10-CM | POA: Insufficient documentation

## 2015-12-25 DIAGNOSIS — Z992 Dependence on renal dialysis: Secondary | ICD-10-CM | POA: Insufficient documentation

## 2015-12-25 DIAGNOSIS — Z794 Long term (current) use of insulin: Secondary | ICD-10-CM | POA: Insufficient documentation

## 2015-12-25 DIAGNOSIS — I509 Heart failure, unspecified: Secondary | ICD-10-CM | POA: Insufficient documentation

## 2015-12-25 HISTORY — PX: FISTULA SUPERFICIALIZATION: SHX6341

## 2015-12-25 HISTORY — DX: Heart failure, unspecified: I50.9

## 2015-12-25 LAB — POCT I-STAT 4, (NA,K, GLUC, HGB,HCT)
Glucose, Bld: 206 mg/dL — ABNORMAL HIGH (ref 65–99)
HEMATOCRIT: 32 % — AB (ref 39.0–52.0)
HEMOGLOBIN: 10.9 g/dL — AB (ref 13.0–17.0)
POTASSIUM: 4.1 mmol/L (ref 3.5–5.1)
SODIUM: 136 mmol/L (ref 135–145)

## 2015-12-25 LAB — GLUCOSE, CAPILLARY: Glucose-Capillary: 199 mg/dL — ABNORMAL HIGH (ref 65–99)

## 2015-12-25 SURGERY — FISTULA SUPERFICIALIZATION
Anesthesia: Choice | Site: Arm Upper | Laterality: Left

## 2015-12-25 MED ORDER — HEPARIN SODIUM (PORCINE) 1000 UNIT/ML IJ SOLN
INTRAMUSCULAR | Status: DC | PRN
Start: 2015-12-25 — End: 2015-12-25
  Administered 2015-12-25: 8 mL via INTRAVENOUS

## 2015-12-25 MED ORDER — LIDOCAINE HCL (PF) 1 % IJ SOLN
INTRAMUSCULAR | Status: AC
  Filled 2015-12-25: qty 30

## 2015-12-25 MED ORDER — HEPARIN SODIUM (PORCINE) 1000 UNIT/ML IJ SOLN
INTRAMUSCULAR | Status: AC
  Filled 2015-12-25: qty 1

## 2015-12-25 MED ORDER — FENTANYL CITRATE (PF) 100 MCG/2ML IJ SOLN
INTRAMUSCULAR | Status: DC | PRN
  Administered 2015-12-25: 25 ug via INTRAVENOUS
  Administered 2015-12-25: 100 ug via INTRAVENOUS
  Administered 2015-12-25: 50 ug via INTRAVENOUS
  Administered 2015-12-25: 25 ug via INTRAVENOUS

## 2015-12-25 MED ORDER — DEXTROSE 5 % IV SOLN
1.5000 g | INTRAVENOUS | Status: AC
  Administered 2015-12-25: 1.5 g via INTRAVENOUS
  Filled 2015-12-25: qty 1.5

## 2015-12-25 MED ORDER — PROPOFOL 10 MG/ML IV BOLUS
INTRAVENOUS | Status: AC
  Filled 2015-12-25: qty 20

## 2015-12-25 MED ORDER — 0.9 % SODIUM CHLORIDE (POUR BTL) OPTIME
TOPICAL | Status: DC | PRN
  Administered 2015-12-25: 1000 mL

## 2015-12-25 MED ORDER — HEMOSTATIC AGENTS (NO CHARGE) OPTIME
TOPICAL | Status: DC | PRN
  Administered 2015-12-25: 1 via TOPICAL

## 2015-12-25 MED ORDER — ONDANSETRON HCL 4 MG/2ML IJ SOLN
INTRAMUSCULAR | Status: DC | PRN
  Administered 2015-12-25: 4 mg via INTRAVENOUS

## 2015-12-25 MED ORDER — LACTATED RINGERS IV SOLN
INTRAVENOUS | Status: DC | PRN
  Administered 2015-12-25: 11:00:00 via INTRAVENOUS

## 2015-12-25 MED ORDER — PHENYLEPHRINE HCL 10 MG/ML IJ SOLN
INTRAMUSCULAR | Status: DC | PRN
  Administered 2015-12-25 (×4): 80 ug via INTRAVENOUS

## 2015-12-25 MED ORDER — PROTAMINE SULFATE 10 MG/ML IV SOLN
INTRAVENOUS | Status: AC
  Filled 2015-12-25: qty 5

## 2015-12-25 MED ORDER — FENTANYL CITRATE (PF) 250 MCG/5ML IJ SOLN
INTRAMUSCULAR | Status: AC
  Filled 2015-12-25: qty 5

## 2015-12-25 MED ORDER — CHLORHEXIDINE GLUCONATE CLOTH 2 % EX PADS: 6.0000 | MEDICATED_PAD | Freq: Once | CUTANEOUS | Status: DC

## 2015-12-25 MED ORDER — OXYCODONE-ACETAMINOPHEN 5-325 MG PO TABS: 1.0000 | ORAL_TABLET | Freq: Four times a day (QID) | ORAL | 0 refills | Status: DC | PRN

## 2015-12-25 MED ORDER — SODIUM CHLORIDE 0.9 % IV SOLN
INTRAVENOUS | Status: DC
  Administered 2015-12-25: 11:00:00 via INTRAVENOUS

## 2015-12-25 MED ORDER — LIDOCAINE HCL (CARDIAC) 20 MG/ML IV SOLN
INTRAVENOUS | Status: DC | PRN
  Administered 2015-12-25: 100 mg via INTRAVENOUS

## 2015-12-25 MED ORDER — EPHEDRINE SULFATE 50 MG/ML IJ SOLN
INTRAMUSCULAR | Status: DC | PRN
  Administered 2015-12-25: 10 mg via INTRAVENOUS

## 2015-12-25 MED ORDER — PROPOFOL 10 MG/ML IV BOLUS
INTRAVENOUS | Status: DC | PRN
  Administered 2015-12-25: 180 mg via INTRAVENOUS

## 2015-12-25 MED ORDER — PROTAMINE SULFATE 10 MG/ML IV SOLN
INTRAVENOUS | Status: DC | PRN
  Administered 2015-12-25: 40 mg via INTRAVENOUS

## 2015-12-25 SURGICAL SUPPLY — 36 items
ARMBAND PINK RESTRICT EXTREMIT (MISCELLANEOUS) ×3 IMPLANT
BNDG ESMARK 4X9 LF (GAUZE/BANDAGES/DRESSINGS) ×3 IMPLANT
CANISTER SUCTION 2500CC (MISCELLANEOUS) ×3 IMPLANT
CLIP TI MEDIUM 6 (CLIP) ×3 IMPLANT
CLIP TI WIDE RED SMALL 6 (CLIP) ×3 IMPLANT
COVER PROBE W GEL 5X96 (DRAPES) IMPLANT
CUFF TOURNIQUET SINGLE 18IN (TOURNIQUET CUFF) ×3 IMPLANT
DECANTER SPIKE VIAL GLASS SM (MISCELLANEOUS) IMPLANT
DRAIN PENROSE 1/2X12 LTX STRL (WOUND CARE) IMPLANT
DRSG COVADERM 4X6 (GAUZE/BANDAGES/DRESSINGS) ×3 IMPLANT
ELECT REM PT RETURN 9FT ADLT (ELECTROSURGICAL) ×3
ELECTRODE REM PT RTRN 9FT ADLT (ELECTROSURGICAL) ×1 IMPLANT
GLOVE BIO SURGEON STRL SZ7 (GLOVE) ×6 IMPLANT
GLOVE BIOGEL PI IND STRL 6.5 (GLOVE) ×1 IMPLANT
GLOVE BIOGEL PI IND STRL 7.5 (GLOVE) ×2 IMPLANT
GLOVE BIOGEL PI INDICATOR 6.5 (GLOVE) ×2
GLOVE BIOGEL PI INDICATOR 7.5 (GLOVE) ×4
GLOVE ECLIPSE 7.0 STRL STRAW (GLOVE) ×3 IMPLANT
GOWN STRL REUS W/ TWL LRG LVL3 (GOWN DISPOSABLE) ×3 IMPLANT
GOWN STRL REUS W/TWL LRG LVL3 (GOWN DISPOSABLE) ×6
HEMOSTAT SPONGE AVITENE ULTRA (HEMOSTASIS) ×3 IMPLANT
KIT BASIN OR (CUSTOM PROCEDURE TRAY) ×3 IMPLANT
KIT ROOM TURNOVER OR (KITS) ×3 IMPLANT
LIQUID BAND (GAUZE/BANDAGES/DRESSINGS) IMPLANT
NS IRRIG 1000ML POUR BTL (IV SOLUTION) ×3 IMPLANT
PACK CV ACCESS (CUSTOM PROCEDURE TRAY) ×3 IMPLANT
PAD ARMBOARD 7.5X6 YLW CONV (MISCELLANEOUS) ×6 IMPLANT
STAPLER VISISTAT 35W (STAPLE) ×3 IMPLANT
SUT MNCRL AB 4-0 PS2 18 (SUTURE) ×3 IMPLANT
SUT PROLENE 5 0 C 1 24 (SUTURE) ×12 IMPLANT
SUT PROLENE 6 0 BV (SUTURE) IMPLANT
SUT PROLENE 7 0 BV 1 (SUTURE) ×3 IMPLANT
SUT VIC AB 3-0 SH 27 (SUTURE) ×2
SUT VIC AB 3-0 SH 27X BRD (SUTURE) ×1 IMPLANT
UNDERPAD 30X30 (UNDERPADS AND DIAPERS) ×3 IMPLANT
WATER STERILE IRR 1000ML POUR (IV SOLUTION) ×3 IMPLANT

## 2015-12-25 NOTE — Telephone Encounter (Signed)
-----   Message from Sharee PimpleMarilyn K McChesney, RN sent at 12/25/2015 12:58 PM EDT ----- Regarding: schedule 2 weeks    ----- Message ----- From: Lars MageEmma M Collins, PA-C Sent: 12/25/2015  12:52 PM To: [facility] Charge Pool  F/U with Dr. Imogene Burnhen in 2 weeks for staple removal s/p plication of left arm AV fistula.

## 2015-12-25 NOTE — Anesthesia Procedure Notes (Addendum)
Procedure Name: LMA Insertion Date/Time: 12/25/2015 11:20 AM Performed by: Reine JustFLOWERS, Julietta Batterman T Pre-anesthesia Checklist: Patient identified, Emergency Drugs available, Suction available, Patient being monitored and Timeout performed Patient Re-evaluated:Patient Re-evaluated prior to inductionOxygen Delivery Method: Circle system utilized and Simple face mask Preoxygenation: Pre-oxygenation with 100% oxygen Intubation Type: IV induction Ventilation: Mask ventilation without difficulty LMA: LMA inserted LMA Size: 5.0 Number of attempts: 2 Airway Equipment and Method: Patient positioned with wedge pillow Placement Confirmation: positive ETCO2 and breath sounds checked- equal and bilateral Tube secured with: Tape Dental Injury: Teeth and Oropharynx as per pre-operative assessment

## 2015-12-25 NOTE — Telephone Encounter (Signed)
LVM on home # for appt on 10/6 at 10:15 am for possible suture removal

## 2015-12-25 NOTE — H&P (View-Only) (Signed)
Referred by:  Arnoldo Morale, MD [place], [place] [address]  Reason for referral: bleeding from left [facility] AVF  History of Present Illness  Michael Dorsey is a 70 y.o. ([birth year]) male who presents for evaluation L arm fistula bleeding.  Pt was sent here for evaluation of two ulcers overly his L BC AVF created by Dr. Donnetta Hutching in 11/10/13.  The pt is unaware of any bleeding episodes or difficulties with flow rates.  Past Medical History:  Diagnosis Date  . Diabetes mellitus without complication ([place])   . Diastolic dysfunction    heart failure with preserved LV function  . Hypercholesteremia   . Hypertension   . Renal disorder    in process of being diagnosed    Past Surgical History:  Procedure Laterality Date  . AV FISTULA PLACEMENT Left 11/10/2013   Procedure: LEFT ARM  ARTERIOVENOUS (AV) FISTULA CREATION;  Surgeon: Rosetta Posner, MD;  Location: [facility];  Service: Vascular;  Laterality: Left;  . NEPHRECTOMY     [facility]    Social History   Social History  . Marital status: Married    Spouse name: N/A  . Number of children: N/A  . Years of education: N/A   Occupational History  . Not on file.   Social History Main Topics  . Smoking status: Never Smoker  . Smokeless tobacco: Never Used  . Alcohol use Yes     Comment: occasional   . Drug use: No  . Sexual activity: Not Currently   Other Topics Concern  . Not on file   Social History Narrative  . No narrative on file    Family History: patient is unable to detail the medical history of his parents   Current Outpatient Prescriptions  Medication Sig Dispense Refill  . acetaminophen-codeine (TYLENOL #3) 300-30 MG per tablet Take 1 tablet by mouth every 12 (twelve) hours as needed for moderate pain. 30 tablet 0  . atorvastatin (LIPITOR) 40 MG tablet Take 1 tablet (40 mg total) by mouth daily. 30 tablet 3  . Blood Glucose Monitoring Suppl (TRUE METRIX METER) w/Device KIT USE AS INSTRUCTED 1 kit 0  .  calcitRIOL (ROCALTROL) 0.25 MCG capsule Take 1 capsule (0.25 mcg total) by mouth daily. 30 capsule 3  . calcium acetate (PHOSLO) 667 MG capsule Take 1 capsule (667 mg total) by mouth 2 (two) times daily. Reported on 07/13/2015 60 capsule 3  . carvedilol (COREG) 25 MG tablet Take 1 tablet (25 mg total) by mouth 2 (two) times daily with a meal. 60 tablet 3  . furosemide (LASIX) 80 MG tablet TAKE 1 TABLET BY MOUTH 3 TIMES DAILY. 90 tablet 3  . glucose blood (TRUE METRIX BLOOD GLUCOSE TEST) test strip Use as instructed 100 each 12  . hydrALAZINE (APRESOLINE) 50 MG tablet TAKE 1 TABLET BY MOUTH EVERY 8 HOURS. 90 tablet 3  . insulin NPH-regular Human (NOVOLIN 70/30) (70-30) 100 UNIT/ML injection Inject 20 Units into the skin 2 (two) times daily with a meal. 10 mL 0  . INSULIN SYRINGE .5CC/28G 28G X 1/2" 0.5 ML MISC Take insulin twice daily for e11.40 100 each 3  . isosorbide mononitrate (IMDUR) 60 MG 24 hr tablet Take 1 & 1/2 tablets by mouth daily. 45 tablet 3  . Lancet Device MISC 1 strip by Does not apply route daily as needed.     . polyethylene glycol (MIRALAX / GLYCOLAX) packet Take 17 g by mouth 2 (two) times daily. 14 each  0  . TRUEPLUS LANCETS 28G MISC USE AS INSTRUCTED 100 each 5   No current facility-administered medications for this visit.     No Known Allergies  REVIEW OF SYSTEMS:   Cardiac:  positive for: no symptoms, negative for: Chest pain or chest pressure, Shortness of breath upon exertion and Shortness of breath when lying flat,   Vascular:  positive for: no symptoms,  negative for: Pain in calf, thigh, or hip brought on by ambulation, Pain in feet at night that wakes you up from your sleep, Blood clot in your veins and Leg swelling  Pulmonary:  positive for: no symptoms,  negative for: Oxygen at home, Productive cough and Wheezing  Neurologic:  positive for: Sudden weakness in arms or legs, negative for: Sudden numbness in arms or legs, Sudden onset of difficulty  speaking or slurred speech, Temporary loss of vision in one eye and Problems with dizziness  Gastrointestinal:  positive for: no symptoms, negative for: Blood in stool and Vomited blood  Genitourinary:  positive for: no symptoms, negative for: Burning when urinating and Blood in urine  Psychiatric:  positive for: no symptoms,  negative for: Major depression  Hematologic:  positive for: no symptoms,  negative for: negative for: Bleeding problems and Problems with blood clotting too easily  Dermatologic:  positive for: no symptoms, negative for: Rashes or ulcers  Constitutional:  positive for: no symptoms, negative for: Fever or chills   Physical Examination  Vitals:   12/22/15 1430  BP: 130/69  Pulse: 82  Resp: 19  Temp: 97.6 F (36.4 C)  TempSrc: Oral  SpO2: 97%  Weight: 172 lb 1.6 oz (78.1 kg)  Height: _0  (1.753 m)    Body mass index is 25.41 kg/m.  General: Alert, O x 3, WD,NAD  Head: [place]/AT,   Ear/Nose/Throat: Hearing grossly intact, nares without erythema or drainage, oropharynx without Erythema or Exudate , Mallampati score: 3, Dentition intact  Eyes: PERRLA, EOMI,   Neck: Supple, mid-line trachea,    Pulmonary: Sym exp, good B air movt,CTA B  Cardiac: RRR, Nl S1, S2, no Murmurs, No rubs, No S3,S4  Vascular: Vessel Right Left  Radial Palpable Palpable  Brachial Palpable Palpable  Carotid Palpable, No Bruit Palpable, No Bruit  Aorta Not palpable N/A  Femoral Palpable Palpable  Popliteal Not palpable Not palpable  PT Palpable Palpable  DP Palpable Palpable   Gastrointestinal: soft, non-distended, non-tender to palpation, No guarding or rebound, no HSM, no masses, no CVAT B, No palpable prominent aortic pulse,    Musculoskeletal: M/S 5/5 throughout  , Extremities without ischemic changes, L BC AVF with large pseudoaneurysm in mid-segment with scabbed two ulcer overlying the fistula, strong thrill in distal fistula, more pulsatile  proximally  Neurologic: CN 2-12 intact , Pain and light touch intact in extremities , Motor exam as listed above  Psychiatric: Judgement intact, Mood & affect appropriate for pt's clinical situation  Dermatologic: See M/S exam for extremity exam, No rashes otherwise noted  Lymph : Palpable lymph nodes: None   Medical Decision Making  Ankit Degregorio is a 70 y.o. male who presents with ESRD requiring hemodialysis, ulcerated L BC AVF PSA   I recommend: plication of L BC AVF as an attempt to salvage the fistula. Risk, benefits, and alternatives to access surgery were discussed.   The patient is aware the risks include but are not limited to: bleeding, infection, steal syndrome, nerve damage, ischemic monomelic neuropathy, failure to mature, need for additional procedures, death and  stroke.  The patient agrees to proceed forward with the procedure.  The patient has agreed to proceed with the above procedure which will be scheduled Monday 25 SEP 17.   Adele Barthel, MD [facility] Office: [phone] Pager: [phone]  12/22/2015, 3:19 PM

## 2015-12-25 NOTE — Transfer of Care (Signed)
Immediate Anesthesia Transfer of Care Note  Patient: Michael Dorsey  Procedure(s) Performed: Procedure(s): LEFT FISTULA PLICATION (Left)  Patient Location: PACU  Anesthesia Type:General  Level of Consciousness: awake and alert   Airway & Oxygen Therapy: Patient Spontanous Breathing and Patient connected to nasal cannula oxygen  Post-op Assessment: Report given to RN and Post -op Vital signs reviewed and stable  Post vital signs: Reviewed and stable  Last Vitals:  Vitals:   12/25/15 1027 12/25/15 1300  BP: (!) 183/88 (!) 144/80  Pulse:  72  Resp:  (!) 8  Temp:      Last Pain:  Vitals:   12/25/15 1006  TempSrc: Oral      Patients Stated Pain Goal: 4 (12/25/15 1027)  Complications: No apparent anesthesia complications

## 2015-12-25 NOTE — Interval H&P Note (Signed)
History and Physical Interval Note:  12/25/2015 10:33 AM  Michael Dorsey  has presented today for surgery, with the diagnosis of End Stage Renal Disease N18.6  The various methods of treatment have been discussed with the patient and family. After consideration of risks, benefits and other options for treatment, the patient has consented to  Procedure(s): FISTULA PLICATION (Left) as a surgical intervention .  The patient's history has been reviewed, patient examined, no change in status, stable for surgery.  I have reviewed the patient's chart and labs.  Questions were answered to the patient's satisfaction.     Leonides SakeBrian Krystopher Kuenzel

## 2015-12-25 NOTE — Anesthesia Postprocedure Evaluation (Signed)
Anesthesia Post Note  Patient: Michael Dorsey  Procedure(s) Performed: Procedure(s) (LRB): LEFT FISTULA PLICATION (Left)  Patient location during evaluation: PACU Anesthesia Type: General Level of consciousness: awake Pain management: pain level controlled Vital Signs Assessment: post-procedure vital signs reviewed and stable Cardiovascular status: stable Anesthetic complications: no    Last Vitals:  Vitals:   12/25/15 1330 12/25/15 1352  BP: 137/76 (!) 158/82  Pulse: 72 70  Resp: 12 18  Temp:      Last Pain:  Vitals:   12/25/15 1330  TempSrc:   PainSc: 2                  EDWARDS,Swade Shonka

## 2015-12-25 NOTE — Anesthesia Preprocedure Evaluation (Addendum)
Anesthesia Evaluation  Patient identified by MRN, date of birth, ID band Patient awake    Reviewed: Allergy & Precautions, NPO status , Patient's Chart, lab work & pertinent test results, reviewed documented beta blocker date and time   Airway Mallampati: II  TM Distance: >3 FB Neck ROM: Full    Dental  (+) Missing, Dental Advisory Given   Pulmonary    breath sounds clear to auscultation       Cardiovascular hypertension, Pt. on medications and Pt. on home beta blockers +CHF   Rhythm:Regular Rate:Normal     Neuro/Psych    GI/Hepatic   Endo/Other  diabetes, Type 2  Renal/GU ESRF and DialysisRenal disease     Musculoskeletal   Abdominal   Peds  Hematology   Anesthesia Other Findings   Reproductive/Obstetrics                            Anesthesia Physical Anesthesia Plan  ASA: III  Anesthesia Plan: General   Post-op Pain Management:    Induction: Intravenous  Airway Management Planned: LMA and Oral ETT  Additional Equipment:   Intra-op Plan:   Post-operative Plan: Extubation in OR  Informed Consent:   Dental advisory given  Plan Discussed with: CRNA, Anesthesiologist and Surgeon  Anesthesia Plan Comments:         Anesthesia Quick Evaluation

## 2015-12-25 NOTE — Op Note (Signed)
    OPERATIVE NOTE   PROCEDURE: 1. Plication of left arteriovenous fistula pseudoaneurysm x 2  PRE-OPERATIVE DIAGNOSIS: Pseudoaneurysm degeneration of arteriovenous fistula   POST-OPERATIVE DIAGNOSIS: same as above   SURGEON: Leonides SakeBrian Demara Lover, MD  ASSISTANT(S): Lianne CureMaureen Collins, [facility]   ANESTHESIA: general  ESTIMATED BLOOD LOSS: 50 cc  FINDING(S): 1. Two moderate sized pseudoaneurysm with attenuated surfaces 2. Palpable thrill at end of the case  SPECIMEN(S):  none  INDICATIONS:   Michael BookmanJohn Anane-Brobbey is a 70 y.o. male who  presents with pseudoaneurysmal degeneration of left arm arteriovenous fistula.  The two pseudoaneurysm had eschar overlying ulcerations that were concerning for imminent rupture.  In order to salvage the fistula and decrease the bleeding complication risks, I recommended plication of the fistula.  Risk, benefits, and alternatives to access surgery were discussed.  The patient is aware the risks include but are not limited to: bleeding, infection, steal syndrome, nerve damage, ischemic monomelic neuropathy, failure to mature, need for additional procedures, thrombosis of the access, death and stroke.  The patient agrees to proceed forward with the procedure.   DESCRIPTION: After obtaining full informed written consent, the patient was brought back to the operating room and placed supine upon the operating table.  The patient received IV antibiotics prior to induction.  After obtaining adequate anesthesia, the patient was prepped and draped in the standard fashion for: left access procedure.  The patient was given 8000 units of Heparin intravenously, which was a therapeutic bolus.  After waiting 5 minutes, a sterile tourniquet was applied to the upper arm and inflated to 250 mm Hg after exsanguinating the arm with an Esmark bandage.    An elliptical incision was made around the skin overlying each of the two pseudoaneurysmal segments in the fistula.  I dissected the skin away  from each pseudoaneurysm with electrocautery.  Eventually, I was able to skeletonize each pseudoaneurysm.  I sharply entered each pseudoaneurysm and tailored each pseudoaneurysm sharply.  The residual fistula wall in each pseudoaneurysmal cavity was felt to be adequately strong to repair.  I repaired each fistula with a running stitch of 5-0 Prolene, taking care to take large bites of the fistula wall.  Prior to completing this repair, I passed 5 mm dilator proximally and distally: no resistance was encountered in either pseudoaneurysm cavity.  The repair was completed in the usual fashion.    At this point, the patient was given 40 mg of Protamine intravenously to reverse the anticoagulation.  Avitene were applied to the incision.  Bleeding points were controlled with suture ligature and electrocautery.  After a few more round of Avitene, no further active bleeding was noted.  I reapproximated the subcutaneous tissue in each incision immediately above the fistula with a running stitch of 3-0 Vicryl.  The skin was reapproximated in each incision with staples.  The skin was cleaned, dried, and the skin dressed with sterile dressings.  The patient continued to have a palpable thrill in the fistula.   COMPLICATIONS: none  CONDITION: stable  Leonides SakeBrian Kedarius Aloisi, MD, [facility] [place] Office: [phone] Pager: [phone]  12/25/2015, 12:48 PM

## 2015-12-25 NOTE — Telephone Encounter (Signed)
Sharee PimpleMarilyn K McChesney, RN  P [facility] Admin Pool      Previous Messages    ----- Message -----  From: Fransisco HertzBrian L Chen, MD  Sent: 12/25/2015 12:53 PM  To: [facility] Charge [address]Pool   Caesar BookmanJohn Anane-Brobbey  [id number]  10-23-1945   PROCEDURE:  Plication of left arteriovenous fistula pseudoaneurysm x 2   Asst: Lianne CureMaureen Collins, [facility]   Follow-up: 2-4 weeks with MD (evaluate prior to staple removal)

## 2015-12-26 ENCOUNTER — Encounter (HOSPITAL_COMMUNITY): Payer: Self-pay | Admitting: Vascular Surgery

## 2016-01-02 ENCOUNTER — Encounter: Payer: Self-pay | Admitting: Family

## 2016-01-05 ENCOUNTER — Encounter: Payer: Self-pay | Admitting: Family

## 2016-01-09 ENCOUNTER — Encounter: Payer: Self-pay | Admitting: Family

## 2016-01-10 ENCOUNTER — Ambulatory Visit (INDEPENDENT_AMBULATORY_CARE_PROVIDER_SITE_OTHER): Payer: Self-pay | Admitting: Family

## 2016-01-10 ENCOUNTER — Encounter: Payer: Self-pay | Admitting: Family

## 2016-01-10 VITALS — BP 160/92 | HR 82 | Temp 98.1°F | Resp 18 | Ht 69.0 in | Wt 172.0 lb

## 2016-01-10 DIAGNOSIS — N186 End stage renal disease: Secondary | ICD-10-CM

## 2016-01-10 NOTE — Progress Notes (Signed)
The patient left before I evaluated him, after sutures were removed.

## 2016-01-10 NOTE — Progress Notes (Signed)
Staples removed from left upper arm. Patient tolerated staple removal well.  Dry 4x4 dressing applied.

## 2016-01-22 ENCOUNTER — Encounter: Payer: Self-pay | Admitting: Vascular Surgery

## 2016-01-22 NOTE — Progress Notes (Deleted)
    Postoperative Access Visit   History of Present Illness  Caesar BookmanJohn Anane-Brobbey is a 70 y.o. year old male who presents for postoperative follow-up for: plication of L AVF PSA x 2 (Date: 12/24/13).  The patient's wounds are *** healed.  The patient notes *** steal symptoms.  The patient is *** able to complete their activities of daily living.  The patient's current symptoms are: ***.  For VQI Use Only  PRE-ADM LIVING: {VQI Pre-admission Living:20973}  AMB STATUS: {VQI Ambulatory Status:20974}  Physical Examination There were no vitals filed for this visit.  LUE: Incision is *** healed, skin feels ***, hand grip is ***/5, sensation in digits is *** intact, ***palpable thrill, bruit can *** be auscultated   Medical Decision Making  Caesar BookmanJohn Anane-Brobbey is a 70 y.o. year old male who presents s/p L AVF PSA plication x 2.  The patient's access is *** ready for use.  The patient's tunneled dialysis catheter can be removed after two successful cannulations and completed dialysis treatments.  Thank you for allowing us to participate in this patient's care.  Leonides SakeBrian Chen, MD, FACS [facility] [place] Office: [phone] Pager: [phone]

## 2016-01-24 ENCOUNTER — Encounter: Payer: Self-pay | Admitting: Vascular Surgery

## 2016-01-26 ENCOUNTER — Other Ambulatory Visit: Payer: Self-pay | Admitting: Internal Medicine

## 2016-01-26 ENCOUNTER — Other Ambulatory Visit: Payer: Self-pay | Admitting: Family Medicine

## 2016-01-26 DIAGNOSIS — I120 Hypertensive chronic kidney disease with stage 5 chronic kidney disease or end stage renal disease: Secondary | ICD-10-CM

## 2016-01-26 DIAGNOSIS — E1122 Type 2 diabetes mellitus with diabetic chronic kidney disease: Secondary | ICD-10-CM

## 2016-01-26 DIAGNOSIS — Z794 Long term (current) use of insulin: Secondary | ICD-10-CM

## 2016-01-26 DIAGNOSIS — N186 End stage renal disease: Secondary | ICD-10-CM

## 2016-01-26 DIAGNOSIS — Z992 Dependence on renal dialysis: Principal | ICD-10-CM

## 2016-01-26 MED FILL — CALCIUM ACETATE 667 MG CAP: 667 | 30 days supply | Qty: 60 | Fill #0

## 2016-01-26 MED FILL — NOVOLIN 70/30 100 UNITS/ML: (70-30) 100 | 25 days supply | Qty: 10 | Fill #0

## 2016-01-26 MED FILL — CARVEDILOL 25 MG TABLET: 25 | 30 days supply | Qty: 60 | Fill #2

## 2016-01-26 MED FILL — CALCITRIOL 0.25 MCG CAPSULE: 0.25 | 30 days supply | Qty: 30 | Fill #1

## 2016-01-26 MED FILL — ISOSORBIDE MN ER 60 MG TAB: 60 | 30 days supply | Qty: 45 | Fill #0

## 2016-01-26 NOTE — Telephone Encounter (Signed)
He needs to obtain that from his nephrologist.

## 2016-01-31 ENCOUNTER — Encounter: Payer: Self-pay | Admitting: Vascular Surgery

## 2016-01-31 NOTE — Progress Notes (Signed)
    Postoperative Access Visit   History of Present Illness  Michael Dorsey is a 70 y.o. year old male who presents for postoperative follow-up for: plication of L arm AVF PSA x 2 (Date: 12/25/15).  The patient's wounds are healed.  The patient notes no steal symptoms.  The patient is able able to complete their activities of daily living.  The patient's current symptoms are: itching of fistula at areas of cannulation. Denies any itching of incisions. Says he has had these symptoms in the past.   For VQI Use Only  PRE-ADM LIVING: Home  AMB STATUS: Ambulatory  Physical Examination Vitals:   02/02/16 1435  BP: (!) 186/95  Pulse: 86  Temp: 98 F (36.7 C)    LUE: Incision is  healed, skin feels warm, sensation in digits is  intact, palpable thrill  Medical Decision Making  Michael Dorsey is a 70 y.o. year old male who presents s/p plication of L AVF PSA x 2.   The patient's access can be cannulated underneath the incisions.   He will follow-up on an as needed basis.   Thank you for allowing us to participate in this patient's care.   Marga MelnickKimbelry Trinh, PA-C [facility] [place]   Addendum  I have independently interviewed and examined the patient, and I agree with the physician assistant's findings.    Leonides SakeBrian Chen, MD, FACS [facility] [place] Office: [phone] Pager: [phone]  02/02/2016, 3:20 PM

## 2016-02-02 ENCOUNTER — Encounter: Payer: Self-pay | Admitting: Vascular Surgery

## 2016-02-02 ENCOUNTER — Ambulatory Visit (INDEPENDENT_AMBULATORY_CARE_PROVIDER_SITE_OTHER): Payer: Self-pay | Admitting: Vascular Surgery

## 2016-02-02 VITALS — BP 186/95 | HR 86 | Temp 98.0°F | Ht 69.0 in | Wt 174.0 lb

## 2016-02-02 DIAGNOSIS — Z992 Dependence on renal dialysis: Secondary | ICD-10-CM

## 2016-02-02 DIAGNOSIS — N186 End stage renal disease: Secondary | ICD-10-CM

## 2016-03-08 ENCOUNTER — Other Ambulatory Visit: Payer: Self-pay | Admitting: Family Medicine

## 2016-03-08 DIAGNOSIS — E1122 Type 2 diabetes mellitus with diabetic chronic kidney disease: Secondary | ICD-10-CM

## 2016-03-08 DIAGNOSIS — N186 End stage renal disease: Secondary | ICD-10-CM

## 2016-03-08 DIAGNOSIS — Z794 Long term (current) use of insulin: Principal | ICD-10-CM

## 2016-03-08 DIAGNOSIS — I120 Hypertensive chronic kidney disease with stage 5 chronic kidney disease or end stage renal disease: Secondary | ICD-10-CM

## 2016-03-08 DIAGNOSIS — Z992 Dependence on renal dialysis: Principal | ICD-10-CM

## 2016-05-08 ENCOUNTER — Emergency Department (HOSPITAL_COMMUNITY): Payer: Medicaid Other

## 2016-05-08 ENCOUNTER — Inpatient Hospital Stay (HOSPITAL_COMMUNITY): Payer: Medicaid Other

## 2016-05-08 ENCOUNTER — Inpatient Hospital Stay (HOSPITAL_COMMUNITY)
Admission: EM | Admit: 2016-05-08 | Discharge: 2016-05-31 | DRG: 097 | Disposition: A | Discharge status: Home Health | Payer: Medicaid Other | Attending: Student in an Organized Health Care Education/Training Program | Admitting: Student in an Organized Health Care Education/Training Program

## 2016-05-08 ENCOUNTER — Encounter (HOSPITAL_COMMUNITY): Payer: Self-pay | Admitting: Emergency Medicine

## 2016-05-08 DIAGNOSIS — Z781 Physical restraint status: Secondary | ICD-10-CM

## 2016-05-08 DIAGNOSIS — I132 Hypertensive heart and chronic kidney disease with heart failure and with stage 5 chronic kidney disease, or end stage renal disease: Secondary | ICD-10-CM | POA: Diagnosis present

## 2016-05-08 DIAGNOSIS — I350 Nonrheumatic aortic (valve) stenosis: Secondary | ICD-10-CM | POA: Diagnosis present

## 2016-05-08 DIAGNOSIS — Z79891 Long term (current) use of opiate analgesic: Secondary | ICD-10-CM

## 2016-05-08 DIAGNOSIS — R4182 Altered mental status, unspecified: Secondary | ICD-10-CM

## 2016-05-08 DIAGNOSIS — R7309 Other abnormal glucose: Secondary | ICD-10-CM

## 2016-05-08 DIAGNOSIS — I5032 Chronic diastolic (congestive) heart failure: Secondary | ICD-10-CM | POA: Diagnosis present

## 2016-05-08 DIAGNOSIS — B1081 Human herpesvirus 6 infection: Secondary | ICD-10-CM | POA: Diagnosis present

## 2016-05-08 DIAGNOSIS — D631 Anemia in chronic kidney disease: Secondary | ICD-10-CM | POA: Diagnosis present

## 2016-05-08 DIAGNOSIS — E519 Thiamine deficiency, unspecified: Secondary | ICD-10-CM | POA: Diagnosis present

## 2016-05-08 DIAGNOSIS — T68XXXA Hypothermia, initial encounter: Secondary | ICD-10-CM | POA: Diagnosis not present

## 2016-05-08 DIAGNOSIS — I509 Heart failure, unspecified: Secondary | ICD-10-CM | POA: Diagnosis present

## 2016-05-08 DIAGNOSIS — E0590 Thyrotoxicosis, unspecified without thyrotoxic crisis or storm: Secondary | ICD-10-CM

## 2016-05-08 DIAGNOSIS — G039 Meningitis, unspecified: Secondary | ICD-10-CM | POA: Diagnosis present

## 2016-05-08 DIAGNOSIS — Z9115 Patient's noncompliance with renal dialysis: Secondary | ICD-10-CM | POA: Diagnosis not present

## 2016-05-08 DIAGNOSIS — I4581 Long QT syndrome: Secondary | ICD-10-CM | POA: Diagnosis present

## 2016-05-08 DIAGNOSIS — F0390 Unspecified dementia without behavioral disturbance: Secondary | ICD-10-CM | POA: Diagnosis present

## 2016-05-08 DIAGNOSIS — E7800 Pure hypercholesterolemia, unspecified: Secondary | ICD-10-CM | POA: Diagnosis present

## 2016-05-08 DIAGNOSIS — E119 Type 2 diabetes mellitus without complications: Secondary | ICD-10-CM

## 2016-05-08 DIAGNOSIS — A86 Unspecified viral encephalitis: Secondary | ICD-10-CM | POA: Diagnosis present

## 2016-05-08 DIAGNOSIS — I129 Hypertensive chronic kidney disease with stage 1 through stage 4 chronic kidney disease, or unspecified chronic kidney disease: Secondary | ICD-10-CM | POA: Diagnosis present

## 2016-05-08 DIAGNOSIS — E11649 Type 2 diabetes mellitus with hypoglycemia without coma: Secondary | ICD-10-CM

## 2016-05-08 DIAGNOSIS — Z79899 Other long term (current) drug therapy: Secondary | ICD-10-CM

## 2016-05-08 DIAGNOSIS — Z8639 Personal history of other endocrine, nutritional and metabolic disease: Secondary | ICD-10-CM

## 2016-05-08 DIAGNOSIS — R319 Hematuria, unspecified: Secondary | ICD-10-CM | POA: Diagnosis present

## 2016-05-08 DIAGNOSIS — I959 Hypotension, unspecified: Secondary | ICD-10-CM | POA: Diagnosis not present

## 2016-05-08 DIAGNOSIS — Z794 Long term (current) use of insulin: Secondary | ICD-10-CM

## 2016-05-08 DIAGNOSIS — N186 End stage renal disease: Secondary | ICD-10-CM | POA: Diagnosis present

## 2016-05-08 DIAGNOSIS — B1001 Human herpesvirus 6 encephalitis: Secondary | ICD-10-CM | POA: Diagnosis present

## 2016-05-08 DIAGNOSIS — E1165 Type 2 diabetes mellitus with hyperglycemia: Secondary | ICD-10-CM | POA: Diagnosis present

## 2016-05-08 DIAGNOSIS — R4701 Aphasia: Secondary | ICD-10-CM | POA: Diagnosis present

## 2016-05-08 DIAGNOSIS — E785 Hyperlipidemia, unspecified: Secondary | ICD-10-CM | POA: Diagnosis present

## 2016-05-08 DIAGNOSIS — R7989 Other specified abnormal findings of blood chemistry: Secondary | ICD-10-CM

## 2016-05-08 DIAGNOSIS — Z905 Acquired absence of kidney: Secondary | ICD-10-CM

## 2016-05-08 DIAGNOSIS — Z992 Dependence on renal dialysis: Secondary | ICD-10-CM

## 2016-05-08 DIAGNOSIS — I639 Cerebral infarction, unspecified: Secondary | ICD-10-CM

## 2016-05-08 DIAGNOSIS — N400 Enlarged prostate without lower urinary tract symptoms: Secondary | ICD-10-CM | POA: Diagnosis present

## 2016-05-08 DIAGNOSIS — B9562 Methicillin resistant Staphylococcus aureus infection as the cause of diseases classified elsewhere: Secondary | ICD-10-CM | POA: Diagnosis present

## 2016-05-08 DIAGNOSIS — G9340 Encephalopathy, unspecified: Secondary | ICD-10-CM | POA: Diagnosis present

## 2016-05-08 DIAGNOSIS — I700 Atherosclerosis of aorta: Secondary | ICD-10-CM | POA: Diagnosis present

## 2016-05-08 DIAGNOSIS — D638 Anemia in other chronic diseases classified elsewhere: Secondary | ICD-10-CM

## 2016-05-08 DIAGNOSIS — E162 Hypoglycemia, unspecified: Secondary | ICD-10-CM

## 2016-05-08 DIAGNOSIS — I444 Left anterior fascicular block: Secondary | ICD-10-CM | POA: Diagnosis present

## 2016-05-08 DIAGNOSIS — R7881 Bacteremia: Secondary | ICD-10-CM

## 2016-05-08 DIAGNOSIS — I161 Hypertensive emergency: Secondary | ICD-10-CM | POA: Diagnosis present

## 2016-05-08 DIAGNOSIS — E1122 Type 2 diabetes mellitus with diabetic chronic kidney disease: Secondary | ICD-10-CM | POA: Diagnosis present

## 2016-05-08 DIAGNOSIS — R451 Restlessness and agitation: Secondary | ICD-10-CM | POA: Diagnosis not present

## 2016-05-08 DIAGNOSIS — N2581 Secondary hyperparathyroidism of renal origin: Secondary | ICD-10-CM | POA: Diagnosis present

## 2016-05-08 DIAGNOSIS — Z7982 Long term (current) use of aspirin: Secondary | ICD-10-CM

## 2016-05-08 DIAGNOSIS — J320 Chronic maxillary sinusitis: Secondary | ICD-10-CM | POA: Diagnosis present

## 2016-05-08 DIAGNOSIS — G0490 Encephalitis and encephalomyelitis, unspecified: Secondary | ICD-10-CM

## 2016-05-08 DIAGNOSIS — R739 Hyperglycemia, unspecified: Secondary | ICD-10-CM

## 2016-05-08 LAB — APTT: aPTT: 95 seconds — ABNORMAL HIGH (ref 24–36)

## 2016-05-08 LAB — COMPREHENSIVE METABOLIC PANEL
ALBUMIN: 3.2 g/dL — AB (ref 3.5–5.0)
ALK PHOS: 52 U/L (ref 38–126)
ALT: 15 U/L — AB (ref 17–63)
AST: 13 U/L — ABNORMAL LOW (ref 15–41)
Anion gap: 13 (ref 5–15)
BILIRUBIN TOTAL: 0.8 mg/dL (ref 0.3–1.2)
BUN: 67 mg/dL — ABNORMAL HIGH (ref 6–20)
CO2: 25 mmol/L (ref 22–32)
Calcium: 8.7 mg/dL — ABNORMAL LOW (ref 8.9–10.3)
Chloride: 94 mmol/L — ABNORMAL LOW (ref 101–111)
Creatinine, Ser: 6.97 mg/dL — ABNORMAL HIGH (ref 0.61–1.24)
GFR calc Af Amer: 8 mL/min — ABNORMAL LOW (ref >60)
GFR calc non Af Amer: 7 mL/min — ABNORMAL LOW (ref >60)
GLUCOSE: 407 mg/dL — AB (ref 65–99)
Potassium: 3.8 mmol/L (ref 3.5–5.1)
Sodium: 132 mmol/L — ABNORMAL LOW (ref 135–145)
TOTAL PROTEIN: 6.3 g/dL — AB (ref 6.5–8.1)

## 2016-05-08 LAB — CBC WITH DIFFERENTIAL/PLATELET
BASOS PCT: 0 %
Basophils Absolute: 0 10*3/uL (ref 0.0–0.1)
Eosinophils Absolute: 0.1 10*3/uL (ref 0.0–0.7)
Eosinophils Relative: 2 %
HEMATOCRIT: 28.8 % — AB (ref 39.0–52.0)
HEMOGLOBIN: 9.9 g/dL — AB (ref 13.0–17.0)
Lymphocytes Relative: 10 %
Lymphs Abs: 0.7 10*3/uL (ref 0.7–4.0)
MCH: 29.6 pg (ref 26.0–34.0)
MCHC: 34.4 g/dL (ref 30.0–36.0)
MCV: 86.2 fL (ref 78.0–100.0)
MONOS PCT: 9 %
Monocytes Absolute: 0.6 10*3/uL (ref 0.1–1.0)
NEUTROS ABS: 5 10*3/uL (ref 1.7–7.7)
NEUTROS PCT: 79 %
Platelets: 179 10*3/uL (ref 150–400)
RBC: 3.34 MIL/uL — ABNORMAL LOW (ref 4.22–5.81)
RDW: 13.9 % (ref 11.5–15.5)
WBC: 6.3 10*3/uL (ref 4.0–10.5)

## 2016-05-08 LAB — I-STAT CHEM 8, ED
BUN: 67 mg/dL — ABNORMAL HIGH (ref 6–20)
CALCIUM ION: 1.05 mmol/L — AB (ref 1.15–1.40)
CREATININE: 7.3 mg/dL — AB (ref 0.61–1.24)
Chloride: 93 mmol/L — ABNORMAL LOW (ref 101–111)
Glucose, Bld: 417 mg/dL — ABNORMAL HIGH (ref 65–99)
HEMATOCRIT: 31 % — AB (ref 39.0–52.0)
HEMOGLOBIN: 10.5 g/dL — AB (ref 13.0–17.0)
Potassium: 3.8 mmol/L (ref 3.5–5.1)
Sodium: 134 mmol/L — ABNORMAL LOW (ref 135–145)
TCO2: 27 mmol/L (ref 0–100)

## 2016-05-08 LAB — TROPONIN I: Troponin I: 0.1 ng/mL (ref <0.03)

## 2016-05-08 LAB — URINALYSIS, ROUTINE W REFLEX MICROSCOPIC
BACTERIA UA: NONE SEEN
BILIRUBIN URINE: NEGATIVE
Glucose, UA: >=500 mg/dL — AB
KETONES UR: NEGATIVE mg/dL
LEUKOCYTES UA: NEGATIVE
NITRITE: NEGATIVE
PH: 7 (ref 5.0–8.0)
Protein, ur: 100 mg/dL — AB
Specific Gravity, Urine: 1.01 (ref 1.005–1.030)

## 2016-05-08 LAB — I-STAT TROPONIN, ED: Troponin i, poc: 0.1 ng/mL (ref 0.00–0.08)

## 2016-05-08 LAB — I-STAT VENOUS BLOOD GAS, ED
Acid-Base Excess: 4 mmol/L — ABNORMAL HIGH (ref 0.0–2.0)
Bicarbonate: 29.8 mmol/L — ABNORMAL HIGH (ref 20.0–28.0)
O2 Saturation: 31 %
PCO2 VEN: 48.9 mmHg (ref 44.0–60.0)
PH VEN: 7.392 (ref 7.250–7.430)
PO2 VEN: 20 mmHg — AB (ref 32.0–45.0)
TCO2: 31 mmol/L (ref 0–100)

## 2016-05-08 LAB — TSH
TSH: 0.201 u[IU]/mL — ABNORMAL LOW (ref 0.350–4.500)
TSH: 0.168 u[IU]/mL — AB (ref 0.350–4.500)

## 2016-05-08 LAB — PHOSPHORUS: PHOSPHORUS: 5.6 mg/dL — AB (ref 2.5–4.6)

## 2016-05-08 LAB — CBG MONITORING, ED
GLUCOSE-CAPILLARY: 400 mg/dL — AB (ref 65–99)
GLUCOSE-CAPILLARY: 309 mg/dL — AB (ref 65–99)
GLUCOSE-CAPILLARY: 178 mg/dL — AB (ref 65–99)
GLUCOSE-CAPILLARY: 181 mg/dL — AB (ref 65–99)

## 2016-05-08 LAB — MAGNESIUM: Magnesium: 2.4 mg/dL (ref 1.7–2.4)

## 2016-05-08 LAB — T4, FREE: Free T4: 1.49 ng/dL — ABNORMAL HIGH (ref 0.61–1.12)

## 2016-05-08 LAB — I-STAT CG4 LACTIC ACID, ED: Lactic Acid, Venous: 0.89 mmol/L (ref 0.5–1.9)

## 2016-05-08 LAB — AMMONIA: Ammonia: 18 umol/L (ref 9–35)

## 2016-05-08 LAB — PROTIME-INR
INR: 1.19
PROTHROMBIN TIME: 15.2 s (ref 11.4–15.2)

## 2016-05-08 MED ORDER — CALCIUM ACETATE (PHOS BINDER) 667 MG PO CAPS
667.0000 mg | ORAL_CAPSULE | Freq: Three times a day (TID) | ORAL | Status: DC
  Administered 2016-05-10 – 2016-05-19 (×25): 667 mg via ORAL
  Filled 2016-05-08 (×33): qty 1

## 2016-05-08 MED ORDER — ACETAMINOPHEN 160 MG/5ML PO SOLN: 650.0000 mg | ORAL | Status: DC | PRN

## 2016-05-08 MED ORDER — ACETAMINOPHEN 325 MG PO TABS
650.0000 mg | ORAL_TABLET | ORAL | Status: DC | PRN
  Administered 2016-05-21: 650 mg via ORAL
  Filled 2016-05-08: qty 2

## 2016-05-08 MED ORDER — INSULIN ASPART PROT & ASPART (70-30 MIX) 100 UNIT/ML ~~LOC~~ SUSP
8.0000 [IU] | Freq: Two times a day (BID) | SUBCUTANEOUS | Status: DC
  Filled 2016-05-08: qty 10

## 2016-05-08 MED ORDER — ASPIRIN 81 MG PO CHEW: 81.0000 mg | CHEWABLE_TABLET | Freq: Every day | ORAL | Status: DC

## 2016-05-08 MED ORDER — ACETAMINOPHEN 650 MG RE SUPP: 650.0000 mg | RECTAL | Status: DC | PRN

## 2016-05-08 MED ORDER — HYDRALAZINE HCL 20 MG/ML IJ SOLN
10.0000 mg | Freq: Once | INTRAMUSCULAR | Status: AC
  Administered 2016-05-08: 10 mg via INTRAVENOUS
  Filled 2016-05-08: qty 1

## 2016-05-08 MED ORDER — STROKE: EARLY STAGES OF RECOVERY BOOK
Freq: Once | Status: AC
  Administered 2016-05-09: 07:00:00
  Filled 2016-05-08: qty 1

## 2016-05-08 MED ORDER — INSULIN ASPART 100 UNIT/ML ~~LOC~~ SOLN
8.0000 [IU] | Freq: Once | SUBCUTANEOUS | Status: AC
  Administered 2016-05-08: 8 [IU] via INTRAVENOUS
  Filled 2016-05-08: qty 1

## 2016-05-08 MED ORDER — HEPARIN SODIUM (PORCINE) 5000 UNIT/ML IJ SOLN
5000.0000 [IU] | Freq: Three times a day (TID) | INTRAMUSCULAR | Status: DC
  Administered 2016-05-09 – 2016-05-31 (×60): 5000 [IU] via SUBCUTANEOUS
  Filled 2016-05-08 (×66): qty 1

## 2016-05-08 MED ORDER — ASPIRIN EC 325 MG PO TBEC: 325.0000 mg | DELAYED_RELEASE_TABLET | Freq: Once | ORAL | Status: DC

## 2016-05-08 MED ORDER — SODIUM CHLORIDE 0.9 % IV BOLUS (SEPSIS)
1000.0000 mL | Freq: Once | INTRAVENOUS | Status: AC
  Administered 2016-05-08: 1000 mL via INTRAVENOUS

## 2016-05-08 MED ORDER — CALCITRIOL 0.25 MCG PO CAPS: 0.2500 ug | ORAL_CAPSULE | Freq: Every day | ORAL | Status: DC

## 2016-05-08 MED ORDER — INSULIN ASPART 100 UNIT/ML ~~LOC~~ SOLN
0.0000 [IU] | Freq: Three times a day (TID) | SUBCUTANEOUS | Status: DC
Start: 2016-05-09 — End: 2016-05-31
  Administered 2016-05-09: 1 [IU] via SUBCUTANEOUS
  Administered 2016-05-09 – 2016-05-11 (×3): 2 [IU] via SUBCUTANEOUS
  Administered 2016-05-11: 5 [IU] via SUBCUTANEOUS
  Administered 2016-05-12: 3 [IU] via SUBCUTANEOUS
  Administered 2016-05-12: 5 [IU] via SUBCUTANEOUS
  Administered 2016-05-13: 2 [IU] via SUBCUTANEOUS
  Administered 2016-05-14 (×2): 3 [IU] via SUBCUTANEOUS
  Administered 2016-05-14: 1 [IU] via SUBCUTANEOUS
  Administered 2016-05-15: 3 [IU] via SUBCUTANEOUS
  Administered 2016-05-16: 1 [IU] via SUBCUTANEOUS
  Administered 2016-05-16: 3 [IU] via SUBCUTANEOUS
  Administered 2016-05-16: 7 [IU] via SUBCUTANEOUS
  Administered 2016-05-17 – 2016-05-18 (×2): 3 [IU] via SUBCUTANEOUS
  Administered 2016-05-19 (×2): 2 [IU] via SUBCUTANEOUS
  Administered 2016-05-20 – 2016-05-21 (×4): 1 [IU] via SUBCUTANEOUS
  Administered 2016-05-21 – 2016-05-23 (×3): 2 [IU] via SUBCUTANEOUS
  Administered 2016-05-23: 7 [IU] via SUBCUTANEOUS
  Administered 2016-05-24: 3 [IU] via SUBCUTANEOUS
  Administered 2016-05-25: 2 [IU] via SUBCUTANEOUS
  Administered 2016-05-25: 3 [IU] via SUBCUTANEOUS
  Administered 2016-05-26 – 2016-05-27 (×5): 2 [IU] via SUBCUTANEOUS
  Administered 2016-05-28: 3 [IU] via SUBCUTANEOUS
  Administered 2016-05-28 – 2016-05-29 (×2): 2 [IU] via SUBCUTANEOUS
  Administered 2016-05-30 – 2016-05-31 (×2): 5 [IU] via SUBCUTANEOUS

## 2016-05-08 NOTE — ED Notes (Signed)
Patient transported to MRI 

## 2016-05-08 NOTE — ED Provider Notes (Signed)
[facility] DEPT Provider Note  CSN: [id number] Arrival date & time: 05/08/16  1427  History   Chief Complaint Chief Complaint  Patient presents with  . Altered Mental Status  . Weakness   HPI Michael Dorsey is a 71 y.o. male.  The history is provided by a relative, medical records and the EMS personnel. The history is limited by the condition of the patient. No language interpreter was used.  Illness  The current episode started more than 2 days ago. The problem occurs constantly. The problem has been gradually worsening. Nothing aggravates the symptoms. Nothing relieves the symptoms.   Past Medical History:  Diagnosis Date  . CHF (congestive heart failure) ([place])   . Diabetes mellitus without complication ([place])   . Diastolic dysfunction    heart failure with preserved LV function  . Hypercholesteremia   . Hypertension   . Renal disorder    in process of being diagnosed   Patient Active Problem List   Diagnosis Date Noted  . Altered mental state 05/08/2016  . ESRD on hemodialysis ([place]) 10/31/2014  . Pain and swelling of left knee 07/08/2014  . Acute on chronic diastolic CHF (congestive heart failure) ([place]) 03/16/2014  . Fluid overload 03/15/2014  . End stage renal disease ([place]) 12/14/2013  . Renal mass 11/10/2013  . Hypertension, renal disease 11/10/2013  . Pleural effusion, bilateral 11/06/2013  . Type 2 diabetes mellitus ([place]) 11/06/2013  . Hyperlipidemia 11/06/2013  . Accelerated hypertension 11/06/2013   Past Surgical History:  Procedure Laterality Date  . AV FISTULA PLACEMENT Left 11/10/2013   Procedure: LEFT ARM  ARTERIOVENOUS (AV) FISTULA CREATION;  Surgeon: Rosetta Posner, MD;  Location: [facility];  Service: Vascular;  Laterality: Left;  . FISTULA SUPERFICIALIZATION Left 12/25/2015   Procedure: LEFT FISTULA PLICATION;  Surgeon: Conrad [place], MD;  Location: [facility];  Service: Vascular;  Laterality: Left;  . [place] Medications    Prior to  Admission medications   Medication Sig Start Date End Date Taking? Authorizing Provider  acetaminophen-codeine (TYLENOL #3) 300-30 MG per tablet Take 1 tablet by mouth every 12 (twelve) hours as needed for moderate pain. 11/04/14   Lance Bosch, NP  aspirin 81 MG tablet Take 81 mg by mouth daily.    Historical Provider, MD  atorvastatin (LIPITOR) 40 MG tablet Take 1 tablet (40 mg total) by mouth daily. 07/13/15   Arnoldo Morale, MD  Blood Glucose Monitoring Suppl (TRUE METRIX METER) w/Device KIT USE AS INSTRUCTED 05/12/15   Lance Bosch, NP  calcitRIOL (ROCALTROL) 0.25 MCG capsule Take 1 capsule (0.25 mcg total) by mouth daily. 07/13/15   Arnoldo Morale, MD  calcium acetate (PHOSLO) 667 MG capsule TAKE 1 CAPSULE BY MOUTH 2 TIMES DAILY. 03/08/16   Arnoldo Morale, MD  furosemide (LASIX) 80 MG tablet TAKE 1 TABLET BY MOUTH 3 TIMES DAILY. 02/28/15   Lance Bosch, NP  hydrALAZINE (APRESOLINE) 50 MG tablet TAKE 1 TABLET BY MOUTH EVERY 8 HOURS. 07/13/15   Arnoldo Morale, MD  isosorbide mononitrate (IMDUR) 60 MG 24 hr tablet TAKE 1 & 1/2 TABLETS BY MOUTH DAILY. 03/08/16   Arnoldo Morale, MD  NOVOLIN 70/30 (70-30) 100 UNIT/ML injection INJECT 20 UNITS INTO THE SKIN 2 TIMES DAILY WITH A MEAL. 03/08/16   Arnoldo Morale, MD  oxyCODONE-acetaminophen (PERCOCET/ROXICET) 5-325 MG tablet Take 1 tablet by mouth every 6 (six) hours as needed. 12/25/15   Ulyses Amor, PA-C  polyethylene glycol [facility] /  GLYCOLAX) packet Take 17 g by mouth 2 (two) times daily. Patient taking differently: Take 17 g by mouth daily as needed.  11/11/13   Bonnielee Haff, MD  TRUEPLUS LANCETS 28G MISC USE AS INSTRUCTED 05/12/15   Lance Bosch, NP   Family History No family history on file.  Social History Social History  Substance Use Topics  . Smoking status: Never Smoker  . Smokeless tobacco: Never Used  . Alcohol use Yes     Comment: occasional    Allergies   Patient has no known allergies.  Review of Systems Review of Systems  Unable  to perform ROS: Acuity of condition   Physical Exam Updated Vital Signs BP 171/97   Pulse 79   Resp 17   Ht 5' 10"  (1.778 m)   Wt 78.9 kg   SpO2 100%   BMI 24.97 kg/m   Physical Exam  Constitutional: No distress.  Overweight elderly African-American male  HENT:  Head: Normocephalic and atraumatic.  Eyes: EOM are normal. Pupils are equal, round, and reactive to light.  Neck: Normal range of motion. Neck supple.  Cardiovascular: Normal rate, regular rhythm and normal heart sounds.   Hypertensive  Pulmonary/Chest: Effort normal and breath sounds normal.  Abdominal: Soft. Bowel sounds are normal. He exhibits no distension. There is no tenderness.  Musculoskeletal: Normal range of motion.  Neurological: He is alert.  Oriented 0, answers yes to all questions, opens eyes spontaneously, tracks around room, difficulty with following commands, strength 5/5 throughout, unable to assess sensation or coordination  Skin: Skin is warm and dry. Capillary refill takes less than 2 seconds. He is not diaphoretic.  Nursing note and vitals reviewed.  ED Treatments / Results  Labs (all labs ordered are listed, but only abnormal results are displayed) Labs Reviewed  CBC WITH DIFFERENTIAL/PLATELET - Abnormal; Notable for the following:       Result Value   RBC 3.34 (*)    Hemoglobin 9.9 (*)    HCT 28.8 (*)    All other components within normal limits  COMPREHENSIVE METABOLIC PANEL - Abnormal; Notable for the following:    Sodium 132 (*)    Chloride 94 (*)    Glucose, Bld 407 (*)    BUN 67 (*)    Creatinine, Ser 6.97 (*)    Calcium 8.7 (*)    Total Protein 6.3 (*)    Albumin 3.2 (*)    AST 13 (*)    ALT 15 (*)    GFR calc non Af Amer 7 (*)    GFR calc Af Amer 8 (*)    All other components within normal limits  TSH - Abnormal; Notable for the following:    TSH 0.201 (*)    All other components within normal limits  APTT - Abnormal; Notable for the following:    aPTT 95 (*)    All  other components within normal limits  URINALYSIS, ROUTINE W REFLEX MICROSCOPIC - Abnormal; Notable for the following:    Glucose, UA >=500 (*)    Hgb urine dipstick SMALL (*)    Protein, ur 100 (*)    Squamous Epithelial / LPF 0-5 (*)    All other components within normal limits  PHOSPHORUS - Abnormal; Notable for the following:    Phosphorus 5.6 (*)    All other components within normal limits  CBG MONITORING, ED - Abnormal; Notable for the following:    Glucose-Capillary 400 (*)    All other components within normal  limits  I-STAT CHEM 8, ED - Abnormal; Notable for the following:    Sodium 134 (*)    Chloride 93 (*)    BUN 67 (*)    Creatinine, Ser 7.30 (*)    Glucose, Bld 417 (*)    Calcium, Ion 1.05 (*)    Hemoglobin 10.5 (*)    HCT 31.0 (*)    All other components within normal limits  I-STAT TROPOININ, ED - Abnormal; Notable for the following:    Troponin i, poc 0.10 (*)    All other components within normal limits  I-STAT VENOUS BLOOD GAS, ED - Abnormal; Notable for the following:    pO2, Ven 20.0 (*)    Bicarbonate 29.8 (*)    Acid-Base Excess 4.0 (*)    All other components within normal limits  CBG MONITORING, ED - Abnormal; Notable for the following:    Glucose-Capillary 309 (*)    All other components within normal limits  CBG MONITORING, ED - Abnormal; Notable for the following:    Glucose-Capillary 178 (*)    All other components within normal limits  CULTURE, BLOOD (ROUTINE X 2)  CULTURE, BLOOD (ROUTINE X 2)  AMMONIA  PROTIME-INR  MAGNESIUM  BLOOD GAS, VENOUS  TSH  T4, FREE  TROPONIN I  TROPONIN I  TROPONIN I  HEMOGLOBIN A1C  I-STAT CG4 LACTIC ACID, ED   EKG  EKG Interpretation  Date/Time:  Wednesday May 08 2016 14:52:08 EST Ventricular Rate:  74 PR Interval:    QRS Duration: 98 QT Interval:  449 QTC Calculation: 499 R Axis:   -117 Text Interpretation:  NSR Left anterior fascicular block Probable right ventricular hypertrophy  Borderline T abnormalities, lateral leads ST elevation, consider inferior injury Borderline prolonged QT interval No STEMI.  Confirmed by LONG MD, JOSHUA [phone]) on 05/08/2016 4:49:08 PM      Radiology Ct Head Wo Contrast  Result Date: 05/08/2016 CLINICAL DATA:  Altered mental status for 5 days. Hypertensive. History of hypertension, diabetes, hypercholesterolemia. EXAM: CT HEAD WITHOUT CONTRAST TECHNIQUE: Contiguous axial images were obtained from the base of the skull through the vertex without intravenous contrast. COMPARISON:  None. FINDINGS: BRAIN: No intraparenchymal hemorrhage, mass effect nor midline shift. Moderate to severe ventriculomegaly on the basis of global parenchymal brain volume loss. Confluent supratentorial and pontine white matter hypodensities. 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 Age indeterminate LEFT basal ganglia lacunar infarct. No acute large vascular territory infarcts. No abnormal extra-axial fluid collections. Basal cisterns are patent. VASCULAR: Mild calcific atherosclerosis of the carotid siphons. SKULL: No skull fracture. No significant scalp soft tissue swelling. SINUSES/ORBITS: Lobulated paranasal sinus mucosal thickening. Mastoid air cells are well aerated. The included ocular globes and orbital contents are non-suspicious. OTHER: None. 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 Old appearing LEFT thalamus lacunar infarct. Moderate to severe global brain atrophy and moderate to severe chronic small vessel ischemic disease. Electronically Signed   By: Elon Alas M.D.   On: 05/08/2016 18:20   Dg Chest Portable 1 View  Result Date: 05/08/2016 CLINICAL DATA:  Altered mental status EXAM: PORTABLE CHEST 1 VIEW COMPARISON:  03/15/2014 FINDINGS: There is no focal parenchymal opacity. There is no pleural effusion or pneumothorax. There is stable cardiomegaly. The osseous structures are unremarkable. IMPRESSION: No active disease. Electronically Signed    By: Kathreen Devoid   On: 05/08/2016 16:44   Procedures Procedures (including critical care time)  Medications Ordered in ED Medications  sodium chloride 0.9 % bolus 1,000 mL (1,000 mLs  Intravenous New Bag/Given 05/08/16 1657)  insulin aspart (novoLOG) injection 8 Units (8 Units Intravenous Given 05/08/16 1655)  hydrALAZINE (APRESOLINE) injection 10 mg (10 mg Intravenous Given 05/08/16 1828)    Initial Impression / Assessment and Plan / ED Course  I have reviewed the triage vital signs and the nursing notes.  71 y.o. male with above stated PMHx, HPI, and physical. PMHx of HTN, HLD, DM, & ESRD (iHD via LUE AVF MWF). 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 Patient lives at home with wife and daughter. Patient has been more altered for past 5 days. Patient refusing to eat and refusing to go to dialysis appointments (missed Monday and today).  Point of care glucose in 400's. Hemoglobin A1c 10 months ago was 11.8. Concern for uncontrolled diabetes. No evidence of DKA on labs. Given IVF's & IV insulin. EKG showing ST elevation in V3 and V4 consistent with J-point elevation and unchanged from prior without evidence of ST depression or new T-wave inversions. iSTAT troponin 0.10 (ESRD & CHF). WBC wnl. LA wnl. UA (-) for infection. CMP showing elevated creatinine to 7 (known ESRD) and azotemia to 67 (baseline 40s to 50s). CXR showing no acute cardiopulmonary disease. CT head showing old thalamic infarct and age indeterminate basal ganglia infarct.  Laboratory and imaging results were personally reviewed by myself and used in the medical decision making of this patient's treatment and disposition.  Nephrology consulted and informed about the condition of the patient and will set the patient up for dialysis this evening. Neurology consultation and informed about the condition of the patient in the concern for the age indeterminate basal ganglia infarct in the setting of  encephalopathy - they will evaluate and follow with recommendations.  Pt admitted to medicine for further evaluation and management of encephalopathy. Pt understands and agrees with the plan and has no further questions or concerns.   Pt care discussed with and followed by my attending, Dr. 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

## 2016-05-08 NOTE — Progress Notes (Signed)
unable to scan pt in MRI, pt is confused/ams. RN in ED informed and said to send pt back.

## 2016-05-08 NOTE — ED Notes (Signed)
Daughter at the bedside. MD aware.

## 2016-05-08 NOTE — ED Notes (Signed)
IV team at bedside 

## 2016-05-08 NOTE — ED Notes (Signed)
Abnormal result of I-stat troponin .10ng/mL and venous blood gas PO2 20 mmHg was given to Dr. Jacqulyn BathLong

## 2016-05-08 NOTE — Consult Note (Signed)
Referring Provider: No ref. provider found Primary Care Physician:  Arnoldo Morale, MD Primary Nephrologist:  Dr. Jimmy Footman  Reason for Consultation:  Medical management of ESRD  Hypertension  Altered mental status  HPI: This is a delightful gentleman that is originally from [place]  He was sent to the Emergency Room from his dialysis center and is accompanied by his daughter. He has been dialysis dependent x 2 years and last dialysis complete was Friday. After dialysis he was very fatigued as usual and became progressively more difficult to rouse , confused and slurred speech. He has had no fever or chills and no skin lesions and has a permanent dialysis access. He had 1 hour of dialysis this afternoon and was sent to ER for an evaluation  In ER he was hypertensive and had a BP 200/100  He had a head CT with some old ischemic changes. The clinicians evaluating him treated his BP and considered a large differential for altered mental status including percocet habitual use and a new ischemic infarct. There was nothing to suggest an infectious cause of encephalopathy and so an LP was not performed.   Past Medical History:  Diagnosis Date  . CHF (congestive heart failure) ([place])   . Diabetes mellitus without complication ([place])   . Diastolic dysfunction    heart failure with preserved LV function  . Hypercholesteremia   . Hypertension   . Renal disorder    in process of being diagnosed    Past Surgical History:  Procedure Laterality Date  . AV FISTULA PLACEMENT Left 11/10/2013   Procedure: LEFT ARM  ARTERIOVENOUS (AV) FISTULA CREATION;  Surgeon: Rosetta Posner, MD;  Location: [place];  Service: Vascular;  Laterality: Left;  . FISTULA SUPERFICIALIZATION Left 12/25/2015   Procedure: LEFT FISTULA PLICATION;  Surgeon: Conrad Dunmor, MD;  Location: [facility];  Service: Vascular;  Laterality: Left;  . NEPHRECTOMY     [facility]    Prior to Admission medications   Medication Sig Start Date End Date Taking?  Authorizing Provider  acetaminophen-codeine (TYLENOL #3) 300-30 MG per tablet Take 1 tablet by mouth every 12 (twelve) hours as needed for moderate pain. 11/04/14   Lance Bosch, NP  aspirin 81 MG tablet Take 81 mg by mouth daily.    Historical Provider, MD  atorvastatin (LIPITOR) 40 MG tablet Take 1 tablet (40 mg total) by mouth daily. 07/13/15   Arnoldo Morale, MD  Blood Glucose Monitoring Suppl (TRUE METRIX METER) w/Device KIT USE AS INSTRUCTED 05/12/15   Lance Bosch, NP  calcitRIOL (ROCALTROL) 0.25 MCG capsule Take 1 capsule (0.25 mcg total) by mouth daily. 07/13/15   Arnoldo Morale, MD  calcium acetate (PHOSLO) 667 MG capsule TAKE 1 CAPSULE BY MOUTH 2 TIMES DAILY. 03/08/16   Arnoldo Morale, MD  furosemide (LASIX) 80 MG tablet TAKE 1 TABLET BY MOUTH 3 TIMES DAILY. 02/28/15   Lance Bosch, NP  hydrALAZINE (APRESOLINE) 50 MG tablet TAKE 1 TABLET BY MOUTH EVERY 8 HOURS. 07/13/15   Arnoldo Morale, MD  isosorbide mononitrate (IMDUR) 60 MG 24 hr tablet TAKE 1 & 1/2 TABLETS BY MOUTH DAILY. 03/08/16   Arnoldo Morale, MD  NOVOLIN 70/30 (70-30) 100 UNIT/ML injection INJECT 20 UNITS INTO THE SKIN 2 TIMES DAILY WITH A MEAL. 03/08/16   Arnoldo Morale, MD  oxyCODONE-acetaminophen (PERCOCET/ROXICET) 5-325 MG tablet Take 1 tablet by mouth every 6 (six) hours as needed. 12/25/15   Ulyses Amor, PA-C  polyethylene glycol (MIRALAX / GLYCOLAX) packet Take 17  g by mouth 2 (two) times daily. Patient taking differently: Take 17 g by mouth daily as needed.  11/11/13   Bonnielee Haff, MD  TRUEPLUS LANCETS 28G MISC USE AS INSTRUCTED 05/12/15   Lance Bosch, NP    No current facility-administered medications for this encounter.    Current Outpatient Prescriptions  Medication Sig Dispense Refill  . acetaminophen-codeine (TYLENOL #3) 300-30 MG per tablet Take 1 tablet by mouth every 12 (twelve) hours as needed for moderate pain. 30 tablet 0  . aspirin 81 MG tablet Take 81 mg by mouth daily.    Marland Kitchen atorvastatin (LIPITOR) 40 MG tablet  Take 1 tablet (40 mg total) by mouth daily. 30 tablet 3  . Blood Glucose Monitoring Suppl (TRUE METRIX METER) w/Device KIT USE AS INSTRUCTED 1 kit 0  . calcitRIOL (ROCALTROL) 0.25 MCG capsule Take 1 capsule (0.25 mcg total) by mouth daily. 30 capsule 3  . calcium acetate (PHOSLO) 667 MG capsule TAKE 1 CAPSULE BY MOUTH 2 TIMES DAILY. 60 capsule 0  . furosemide (LASIX) 80 MG tablet TAKE 1 TABLET BY MOUTH 3 TIMES DAILY. 90 tablet 3  . hydrALAZINE (APRESOLINE) 50 MG tablet TAKE 1 TABLET BY MOUTH EVERY 8 HOURS. 90 tablet 3  . isosorbide mononitrate (IMDUR) 60 MG 24 hr tablet TAKE 1 & 1/2 TABLETS BY MOUTH DAILY. 45 tablet 0  . NOVOLIN 70/30 (70-30) 100 UNIT/ML injection INJECT 20 UNITS INTO THE SKIN 2 TIMES DAILY WITH A MEAL. 10 mL 0  . oxyCODONE-acetaminophen (PERCOCET/ROXICET) 5-325 MG tablet Take 1 tablet by mouth every 6 (six) hours as needed. 10 tablet 0  . polyethylene glycol (MIRALAX / GLYCOLAX) packet Take 17 g by mouth 2 (two) times daily. (Patient taking differently: Take 17 g by mouth daily as needed. ) 14 each 0  . TRUEPLUS LANCETS 28G MISC USE AS INSTRUCTED 100 each 5    Allergies as of 05/08/2016  . (No Known Allergies)    No family history on file.  Social History   Social History  . Marital status: Married    Spouse name: N/A  . Number of children: N/A  . Years of education: N/A   Occupational History  . Not on file.   Social History Main Topics  . Smoking status: Never Smoker  . Smokeless tobacco: Never Used  . Alcohol use Yes     Comment: occasional   . Drug use: No  . Sexual activity: Not Currently   Other Topics Concern  . Not on file   Social History Narrative  . No narrative on file    Review of Systems: Gen: Denies any fever, chills, sweats, + anorexia, + fatigue, +  Weakness, +  malaise, no weight loss, and sleep disorder HEENT: No visual complaints, No history of Retinopathy. Normal external appearance No Epistaxis or Sore throat. No sinusitis.   CV:  Denies chest pain, angina, palpitations, syncope, orthopnea, PND, peripheral edema, and claudication. Resp: Denies dyspnea at rest, dyspnea with exercise, cough, sputum, wheezing, coughing up blood, and pleurisy. GI: Denies vomiting blood, jaundice, and fecal incontinence.   Denies dysphagia or odynophagia. GU : Denies urinary burning, blood in urine, urinary frequency, urinary hesitancy, nocturnal urination, and urinary incontinence.  No renal calculi.Condom catheter  MS: Denies joint pain, limitation of movement, and swelling, stiffness, low back pain, extremity pain. Denies muscle weakness, cramps, atrophy.  No use of non steroidal antiinflammatory drugs. Derm: Denies rash, itching, dry skin, hives, moles, warts, or unhealing ulcers.  Psych: Denies  depression, anxiety, memory loss, suicidal ideation, hallucinations, paranoia, and confusion. Heme: Denies bruising, bleeding, and enlarged lymph nodes. Neuro: .+ dysarthria.  + dysphasia.  No history of CVA.  No Seizures.  Endocrine  DM.  No Thyroid disease.  No Adrenal disease.  Physical Exam: Vital signs in last 24 hours: Pulse Rate:  [62-85] 81 (02/07 1945) Resp:  [13-23] 19 (02/07 1945) BP: (170-195)/(80-122) 178/89 (02/07 1945) SpO2:  [99 %-100 %] 99 % (02/07 1945) Weight:  [78.9 kg (174 lb)] 78.9 kg (174 lb) (02/07 1438)   General:  Elderly male who was cooperative and pleasant but disorientated x 4 Head:  Normocephalic and atraumatic. Eyes:  Sclera clear, no icterus.   Conjunctiva pink. Ears:  Normal auditory acuity. Nose:  No deformity, discharge,  or lesions. Mouth:  No deformity or lesions, dentition normal. Neck:  Supple; no masses or thyromegaly. JVP not elevated Lungs:  Clear throughout to auscultation.   No wheezes, crackles, or rhonchi. No acute distress. Heart:  Regular rate and rhythm; systolic flow murmurs, no clicks, rubs,  or gallops. Abdomen:  Soft, nontender and nondistended. No masses, hepatosplenomegaly or hernias  noted. Normal bowel sounds, without guarding, and without rebound.   Msk:  Symmetrical without gross deformities. Normal posture. Pulses:  No carotid, renal, femoral bruits. DP and PT symmetrical and equal Extremities:  Without clubbing or edema. Neurologic:  Alert but not orientated answering simple yes to all questions . Some tremors and myoclonus noted in upper extremity Skin:  Intact without significant lesions or rashes. Cervical Nodes:  No significant cervical adenopathy. Psych:  Alert and cooperative  Intake/Output from previous day: No intake/output data recorded. Intake/Output this shift: No intake/output data recorded.  Lab Results:  Recent Labs  05/08/16 1622 05/08/16 1638  WBC 6.3  --   HGB 9.9* 10.5*  HCT 28.8* 31.0*  PLT 179  --    BMET  Recent Labs  05/08/16 1622 05/08/16 1638  NA 132* 134*  K 3.8 3.8  CL 94* 93*  CO2 25  --   GLUCOSE 407* 417*  BUN 67* 67*  CREATININE 6.97* 7.30*  CALCIUM 8.7*  --    LFT  Recent Labs  05/08/16 1622  PROT 6.3*  ALBUMIN 3.2*  AST 13*  ALT 15*  ALKPHOS 52  BILITOT 0.8   PT/INR  Recent Labs  05/08/16 1622  LABPROT 15.2  INR 1.19   Hepatitis Panel No results for input(s): HEPBSAG, HCVAB, HEPAIGM, HEPBIGM in the last 72 hours.  Studies/Results: Ct Head Wo Contrast  Result Date: 05/08/2016 CLINICAL DATA:  Altered mental status for 5 days. Hypertensive. History of hypertension, diabetes, hypercholesterolemia. EXAM: CT HEAD WITHOUT CONTRAST TECHNIQUE: Contiguous axial images were obtained from the base of the skull through the vertex without intravenous contrast. COMPARISON:  None. FINDINGS: BRAIN: No intraparenchymal hemorrhage, mass effect nor midline shift. Moderate to severe ventriculomegaly on the basis of global parenchymal brain volume loss. Confluent supratentorial and pontine white matter hypodensities. 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 Age indeterminate LEFT basal ganglia lacunar infarct. No acute  large vascular territory infarcts. No abnormal extra-axial fluid collections. Basal cisterns are patent. VASCULAR: Mild calcific atherosclerosis of the carotid siphons. SKULL: No skull fracture. No significant scalp soft tissue swelling. SINUSES/ORBITS: Lobulated paranasal sinus mucosal thickening. Mastoid air cells are well aerated. The included ocular globes and orbital contents are non-suspicious. OTHER: None. 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 Old appearing LEFT thalamus lacunar infarct. Moderate to severe global brain atrophy and moderate  to severe chronic small vessel ischemic disease. Electronically Signed   By: Elon Alas M.D.   On: 05/08/2016 18:20   Dg Chest Portable 1 View  Result Date: 05/08/2016 CLINICAL DATA:  Altered mental status EXAM: PORTABLE CHEST 1 VIEW COMPARISON:  03/15/2014 FINDINGS: There is no focal parenchymal opacity. There is no pleural effusion or pneumothorax. There is stable cardiomegaly. The osseous structures are unremarkable. IMPRESSION: No active disease. Electronically Signed   By: Kathreen Devoid   On: 05/08/2016 16:44    Assessment/Plan:  End stage Renal disease MWF dialysis will plan dialysis today  I doubt that the patient is underdialysed and uremia is playing a role and favor other etiology for AMS  Such as PRES syndrome. An MRI would be helpful to evaluate for a small stroke  HTN  Appears very high BP 200/100  Would gradually lower with volume removal on dialysis and antihypertensive agents  Anemia stable no ESA  Bones controlled will need binders and vitamin D    LOS: 0 Mac[place] Sandiford W @TODAY @7 :59 PM

## 2016-05-08 NOTE — ED Notes (Signed)
Pt transported to CT ?

## 2016-05-08 NOTE — ED Triage Notes (Addendum)
[facility] with c/o AMS starting 5 days ago and BLE weakness beginning today. Per family, pt was unable to walk since this AM. Able to MAE's, L arm weaker but L leg stronger on neuro assessment. Facial symmetry equal on ED arrival, pt a&ox2. Hypertensive, BP 205/98. Pt received 30min session of dialysis today. CBG 517

## 2016-05-08 NOTE — Progress Notes (Signed)
Left AV graft deaccessed with slight bleeding noted, pressure dressing applied,RN made aware.

## 2016-05-08 NOTE — ED Notes (Signed)
ED Provider at bedside. 

## 2016-05-08 NOTE — ED Notes (Signed)
CBG 181.  

## 2016-05-08 NOTE — ED Notes (Signed)
Pt poor historian, able to answer in short sentences but appears confused and communicates mainly with "yes" or "no" answers and head movements. Able to reach daughter Jae DireKate on the phone, asked daughter if able to come to ED to assist with pt assessment. Per pt's daughter she will be here to visit pt and help answer questions after a doctor appointment. Dr. Joaquim LaiFranasiak speaking with pt's daughter on the phone at this time.

## 2016-05-08 NOTE — ED Notes (Addendum)
Per Alona BeneJoyce, RN, RRT to assess pt in the ED before transport to 6E.

## 2016-05-08 NOTE — ED Notes (Signed)
XR at bedside

## 2016-05-08 NOTE — ED Notes (Signed)
Provider at bedside

## 2016-05-08 NOTE — H&P (Signed)
Date: 05/08/2016               Patient Name:  Michael Dorsey MRN: [id number]  DOB: 08-Feb-1946 Age / Sex: 71 y.o., male   PCP: Arnoldo Morale, MD         Medical Service: Internal Medicine Teaching Service         Attending Physician: Dr. Oval Linsey, MD    First Contact: Dr. Ophelia Shoulder Pager: [phone]  Second Contact: Dr. Zada Finders Pager: [phone]       After Hours (After 5p/  First Contact Pager: [phone]  weekends / holidays): Second Contact Pager: [phone]   Chief Complaint: AMS  History of Present Illness: [name] is a 71 y.o. male with a h/o of HTN, HLD, DM, & ESRD (HD via LUE AVF MWF) who presents with 5 day h/o AMS. Pt is not oriented and no family was present for questioning. The following information was obtained from review of the ED providers note.  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 Pt reportedly stopped eating and missed his last two dialysis session on Monday and Wednesday (today). The pt was reportedly incontinent of stool. Family brought the pt for evaluation today because his symptoms were not resolving, previously they thought his symptoms would be self-limited.  In the ED, he was found to have uncontrolled DM w/ elevated BG to 400s, no AG, treated with IVF and insulin. He had BUN of 67 and normal K 3.8. LA wnl. His BP was significantly elevated to 195/122 on presentation, treated w/ hydralizine. EKG showed unchanged J point elevation in V3/4, no new ST depression/T wave changes. POC Trop 0.10. Afebrile w/o leukocytosis, CXR unremarkable, UA pending. Head CT demonstrated old L thalamic stroke and indeterminate age L BG stroke, no bleeds. TSH suppressed at 0.201.  Meds: Prescriptions Prior to Admission  Medication Sig Dispense Refill Last Dose  . acetaminophen-codeine (TYLENOL #3) 300-30 MG per tablet Take 1 tablet by mouth every 12 (twelve) hours as  needed for moderate pain. 30 tablet 0 Taking  . aspirin 81 MG tablet Take 81 mg by mouth daily.   Taking  . atorvastatin (LIPITOR) 40 MG tablet Take 1 tablet (40 mg total) by mouth daily. 30 tablet 3 Taking  . Blood Glucose Monitoring Suppl (TRUE METRIX METER) w/Device KIT USE AS INSTRUCTED 1 kit 0 Taking  . calcitRIOL (ROCALTROL) 0.25 MCG capsule Take 1 capsule (0.25 mcg total) by mouth daily. 30 capsule 3 Taking  . calcium acetate (PHOSLO) 667 MG capsule TAKE 1 CAPSULE BY MOUTH 2 TIMES DAILY. 60 capsule 0   . furosemide (LASIX) 80 MG tablet TAKE 1 TABLET BY MOUTH 3 TIMES DAILY. 90 tablet 3 Taking  . hydrALAZINE (APRESOLINE) 50 MG tablet TAKE 1 TABLET BY MOUTH EVERY 8 HOURS. 90 tablet 3 Taking  . isosorbide mononitrate (IMDUR) 60 MG 24 hr tablet TAKE 1 & 1/2 TABLETS BY MOUTH DAILY. 45 tablet 0   . NOVOLIN 70/30 (70-30) 100 UNIT/ML injection INJECT 20 UNITS INTO THE SKIN 2 TIMES DAILY WITH A MEAL. 10 mL 0   . oxyCODONE-acetaminophen (PERCOCET/ROXICET) 5-325 MG tablet Take 1 tablet by mouth every 6 (six) hours as needed. 10 tablet 0 Taking  . polyethylene glycol (MIRALAX / GLYCOLAX) packet Take 17 g by mouth 2 (two) times daily. (Patient taking differently: Take 17 g by mouth daily as needed. ) 14 each 0 Taking  .  TRUEPLUS LANCETS 28G MISC USE AS INSTRUCTED 100 each 5 Taking   Allergies: Allergies as of 05/08/2016  . (No Known Allergies)   Past Medical History:  Diagnosis Date  . CHF (congestive heart failure) ([place])   . Diabetes mellitus without complication ([place])   . Diastolic dysfunction    heart failure with preserved LV function  . Hypercholesteremia   . Hypertension   . Renal disorder    in process of being diagnosed   Family History: Unable to obtain d/t pt AMS.  Social History: Social History  Substance Use Topics  . Smoking status: Never Smoker  . Smokeless tobacco: Never Used  . Alcohol use Yes     Comment: occasional    Review of Systems: A complete ROS was negative  except as per HPI. Review of Systems  Unable to perform ROS: Mental status change    Physical Exam: Vitals:   05/08/16 1745 05/08/16 1830 05/08/16 1845 05/08/16 1900  BP: (!) 192/113 174/80 178/93 170/95  Pulse: 62 65 65 85  Resp: 19 20 13 19   SpO2: 99% 100% 100% 100%  Weight:      Height:       Physical Exam  Constitutional: He is cooperative. No distress.  HENT:  Head: Normocephalic and atraumatic.  Right Ear: Hearing normal.  Left Ear: Hearing normal.  Nose: Nose normal.  Mouth/Throat: Mucous membranes are normal.  Eyes: Pupils are equal, round, and reactive to light.  Muddy conjunctiva  Cardiovascular: Normal rate, regular rhythm, S1 normal, S2 normal and intact distal pulses.  Exam reveals no gallop.   No murmur heard. Pulmonary/Chest: Effort normal and breath sounds normal. No respiratory distress. He has no wheezes. He has no rhonchi. He has no rales. He exhibits no tenderness.  Abdominal: Soft. Normal appearance and bowel sounds are normal. He exhibits no distension and no ascites. There is no hepatosplenomegaly. There is generalized tenderness. There is no rigidity, no rebound, no guarding and no CVA tenderness.  Musculoskeletal: He exhibits no edema.  BLE smooth, shiny and w/o hair. No significant pitting edema noted.  Neurological: He has normal strength. He is disoriented.  Pt was disoriented and did not follow commands. His strength seemed grossly intact and there were no appreciable focal deficits although a thorough and systematic exam could not be completed d/t lack of pt cooperation.  Skin: Skin is warm, dry and intact. He is not diaphoretic.  Psychiatric: He is slowed. He is noncommunicative.  Pt w/ some nonsensical perseveration. Unable to follow commands consistently.   Labs: CBC:  Recent Labs Lab 05/08/16 1622 05/08/16 1638  WBC 6.3  --   NEUTROABS 5.0  --   HGB 9.9* 10.5*  HCT 28.8* 31.0*  MCV 86.2  --   PLT 179  --    Basic Metabolic  Panel:  Recent Labs Lab 05/08/16 1622 05/08/16 1638  NA 132* 134*  K 3.8 3.8  CL 94* 93*  CO2 25  --   GLUCOSE 407* 417*  BUN 67* 67*  CREATININE 6.97* 7.30*  CALCIUM 8.7*  --    Cardiac Enzymes:  Recent Labs Lab 05/08/16 1636  TROPIPOC 0.10*   Coagulation Studies:  Recent Labs  05/08/16 1622  LABPROT 15.2  INR 1.19   Liver Function Tests:  Recent Labs Lab 05/08/16 1622  AST 13*  ALT 15*  ALKPHOS 52  BILITOT 0.8  PROT 6.3*  ALBUMIN 3.2*    Recent Labs Lab 05/08/16 1622  AMMONIA 18   CBG:  Lab Results  Component Value Date   HGBA1C 11.8 07/13/2015    Recent Labs Lab 05/08/16 1448 05/08/16 1728 05/08/16 1827  GLUCAP 400* 309* 178*   EKG: EKG Interpretation  Date/Time:  Wednesday May 08 2016 14:52:08 EST Ventricular Rate:  74 PR Interval:    QRS Duration: 98 QT Interval:  449 QTC Calculation: 499 R Axis:   -117 Text Interpretation:  NSR Left anterior fascicular block Probable right ventricular hypertrophy Borderline T abnormalities, lateral leads ST elevation, consider inferior injury Borderline prolonged QT interval No STEMI.  Confirmed by LONG MD, JOSHUA [phone]) on 05/08/2016 4:49:08 PM  Imaging: Ct Head Wo Contrast  Result Date: 05/08/2016 CLINICAL DATA:  Altered mental status for 5 days. Hypertensive. History of hypertension, diabetes, hypercholesterolemia. EXAM: CT HEAD WITHOUT CONTRAST TECHNIQUE: Contiguous axial images were obtained from the base of the skull through the vertex without intravenous contrast. COMPARISON:  None. FINDINGS: BRAIN: No intraparenchymal hemorrhage, mass effect nor midline shift. Moderate to severe ventriculomegaly on the basis of global parenchymal brain volume loss. Confluent supratentorial and pontine white matter hypodensities. 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 Age indeterminate LEFT basal ganglia lacunar infarct. No acute large vascular territory infarcts. No abnormal extra-axial fluid collections. Basal  cisterns are patent. VASCULAR: Mild calcific atherosclerosis of the carotid siphons. SKULL: No skull fracture. No significant scalp soft tissue swelling. SINUSES/ORBITS: Lobulated paranasal sinus mucosal thickening. Mastoid air cells are well aerated. The included ocular globes and orbital contents are non-suspicious. OTHER: None. 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 Old appearing LEFT thalamus lacunar infarct. Moderate to severe global brain atrophy and moderate to severe chronic small vessel ischemic disease. Electronically Signed   By: Elon Alas M.D.   On: 05/08/2016 18:20   Dg Chest Portable 1 View  Result Date: 05/08/2016 CLINICAL DATA:  Altered mental status EXAM: PORTABLE CHEST 1 VIEW COMPARISON:  03/15/2014 FINDINGS: There is no focal parenchymal opacity. There is no pleural effusion or pneumothorax. There is stable cardiomegaly. The osseous structures are unremarkable. IMPRESSION: No active disease. Electronically Signed   By: Kathreen Devoid   On: 05/08/2016 16:44   Assessment & Plan by Problem: Principal Problem:   Altered mental state Active Problems:   Type 2 diabetes mellitus (HCC)   Hypertension, renal disease   ESRD on hemodialysis [facility])  [name] is a 71 y.o. male with HTN, DM, ESRD on HD, who presents w/ 5 day h/o altered mental status concerning for stroke versus uremia in the setting of missed HD.  1) AMS: Pt does not appear infected or septic. Could be 2/2 ACS given HTN urgency and mild trop, however, trop elevation likely 2/2 ESRD and EKG unchanged. Metabolic encephalopathy possible given elevated BUN and missed HD, however, nephrology assessment favors PRES or CVA rather than uremia. TSH also suppressed, though thyrotoxicosis seems less likely with only mild elevation of free T4. CT Head concerning for BG infarct likely 2/2 HTN, will pursue further w/u. - admit to tele - trend Trop i x3 - HD for concern of uremia - Neurology  c/s - MRI - Slow correction of HTN w/ permissive HTN goal of SBP <185.  2) IDDM: Uncontrolled, last HgbA1c 11.8 (07/03/15). Elevated BG today w/o evidence of DKA. HHS unlikely given mild hyperglycemia w/o signs of significant hypovolemia. s/p 8U insulin in ED. Home regimen: Novolin 70/30 20U BID. - A1c - Novolin 70/30 10U BID + SSI-s  3) Malignant HTN: Uncontrolled. BP 190/122 on admission. Likely exacerbated by hypervolemia in  the setting of missed HD. Chronically may have led to lacunar infarct, however, acute HTN emergency may result in PRES. Will plan for slow correction with moderate SBP goal of 185 as above. - HD per Nephrology - Hydralazine PRN BP > 185/110.  4) ESRD: HD on MWF, missed last two. Nephrology following. Hypertensive w/ elevated BUN. Phos elevated at 5.6. Mg/K wnl. - HD tonight per Nephrology  DVT PPx - heparin  Code Status - Full  Consults Placed - Nephrology, Neurology  Dispo: Admit patient to Inpatient with expected length of stay greater than 2 midnights.  Signed: Holley Raring, MD 05/08/2016, 7:25 PM  Pager: [phone]

## 2016-05-08 NOTE — ED Notes (Addendum)
Per pt's daughter, at baseline pt speaks in complete sentences, is ambulatory with a cane, continent of both urine and stool, and answers questions appropriately. Pt's daughter reports about 5 days ago, pt began refusing his medications, he refused to attend dialysis Monday, and has not been eating. Per pt's daughter pt is continent of urine sometimes and has been incontinent of stool. Pt's daughter reports they did not bring pt to ED for assessment because pt's family felt that his behavior would "go away on it's own".

## 2016-05-08 NOTE — Consult Note (Signed)
NEURO HOSPITALIST CONSULT NOTE   Requestig physician: Dr. Eppie Gibson  Reason for Consult: AMS  History obtained from:    Chart    HPI:                                                                                                                                          Michael Dorsey is an 71 y.o. male who presented for evaluation of AMS. Patient unable to provide a history, therefore chart was reviewed: Per ED note: "Per pt's daughter, at baseline pt speaks in complete sentences, is ambulatory with a cane, continent of both urine and stool, and answers questions appropriately. Pt's daughter reports about 5 days ago, pt began refusing his medications, he refused to attend dialysis Monday, and has not been eating. Per pt's daughter pt is continent of urine sometimes and has been incontinent of stool. Pt's daughter reports they did not bring pt to ED for assessment because pt's family felt that his behavior would "go away on it's own"."  In the ED he was hypertensive with a BP of 200/100. His head CT showed old ischemic changes. Initial evaluation yielded a wide DDx for his AMS including habitual use of Percocet habitual use and a new ischemic infarct. There was nothing to suggest an infectious cause of encephalopathy, therefore an LP was not performed.  Nephrology was consulted, noting that he has been dialysis dependent x 2 years and last dialysis was Friday. Also noted that after dialysis he was very fatigued as usual and became progressively more difficult to rouse , confused and with slurred speech. Nephrology consultant noted that the patient had no fever or chills and no skin lesions and has a permanent dialysis access. Nephrology doubted that the patient was underdialysed, with uremia not likely to be playing a role. Nephrology favored other etiology for AMS such as PRES and felt that an MRI would be helpful to evaluate for a small stroke.  Past Medical History:   Diagnosis Date  . CHF (congestive heart failure) ([place])   . Diabetes mellitus without complication ([place])   . Diastolic dysfunction    heart failure with preserved LV function  . Hypercholesteremia   . Hypertension   . Renal disorder    in process of being diagnosed    Past Surgical History:  Procedure Laterality Date  . AV FISTULA PLACEMENT Left 11/10/2013   Procedure: LEFT ARM  ARTERIOVENOUS (AV) FISTULA CREATION;  Surgeon: Rosetta Posner, MD;  Location: [place];  Service: Vascular;  Laterality: Left;  . FISTULA SUPERFICIALIZATION Left 12/25/2015   Procedure: LEFT FISTULA PLICATION;  Surgeon: Conrad [place], MD;  Location: [place];  Service: Vascular;  Laterality: Left;  . NEPHRECTOMY     [facility]    No family history on  file.   Social History:  reports that he has never smoked. He has never used smokeless tobacco. He reports that he drinks alcohol. He reports that he does not use drugs.  No Known Allergies  MEDICATIONS:                                                                                                                      Current Facility-Administered Medications:  .  acetaminophen (TYLENOL) tablet 650 mg, 650 mg, Oral, Q4H PRN **OR** acetaminophen (TYLENOL) solution 650 mg, 650 mg, Per Tube, Q4H PRN **OR** acetaminophen (TYLENOL) suppository 650 mg, 650 mg, Rectal, Q4H PRN, Norman Herrlich, MD .  aspirin chewable tablet 81 mg, 81 mg, Oral, Daily, Norman Herrlich, MD .  aspirin EC tablet 325 mg, 325 mg, Oral, Once, Norman Herrlich, MD .  calcitRIOL (ROCALTROL) capsule 0.25 mcg, 0.25 mcg, Oral, Daily, Norman Herrlich, MD .  calcium acetate (PHOSLO) capsule 667 mg, 667 mg, Oral, TID WC, Norman Herrlich, MD .  heparin injection 5,000 Units, 5,000 Units, Subcutaneous, Q8H, Norman Herrlich, MD, 5,000 Units at 05/09/16 0636 .  insulin aspart (novoLOG) injection 0-9 Units, 0-9 Units, Subcutaneous, TID WC, Norman Herrlich, MD .  insulin aspart protamine- aspart (NOVOLOG MIX 70/30) injection 8  Units, 8 Units, Subcutaneous, BID WC, Norman Herrlich, MD    ROS:                                                                                                                                       Unable to obtain ROS due to AMS.   Blood pressure 178/89, pulse 81, resp. rate 19, height _0  (1.778 m), weight 78.9 kg (174 lb), SpO2 99 %.  General Examination:                                                                                                      HEENT-  Normocephalic/atraumatic.  Lungs- No gross wheezing. Respirations unlabored.  Extremities- Warm and well-perfused.   Neurological Examination Mental Status: Awake. Short, nonsensical, agrammatical speech in response to examiner's orientation questions. Poor attention. Will occasionally gaze at examiner and follows approximately 25% of simple motor commands.  Cranial Nerves: II: Blinks to threat and tracks examiner's face briefly. Unable to formally assess visual fields. PERRL III,IV, VI: ptosis not present, EOMI without nystagmus V,VII: smile symmetric, unable to formally assess facial sensation VIII: reacts to auditory stimuli IX,X: mildly hypophonic XI: Head at midposition XII: Does not follow commands Motor: Mildly increased tone x 4. Bulk normal. Moves all 4 extremities antigravity to command without asymmetry. Does not follow commands for formal strength testing.  Sensory: Reacts to mild noxious x 4.  Deep Tendon Reflexes: 1+ and symmetric throughout Plantars: Equivocal bilaterally.  Cerebellar: Does not follow commands for testing.  Gait: Deferred   Lab Results: Basic Metabolic Panel:  Recent Labs Lab 05/08/16 1622 05/08/16 1638  NA 132* 134*  K 3.8 3.8  CL 94* 93*  CO2 25  --   GLUCOSE 407* 417*  BUN 67* 67*  CREATININE 6.97* 7.30*  CALCIUM 8.7*  --     Liver Function Tests:  Recent Labs Lab 05/08/16 1622  AST 13*  ALT 15*  ALKPHOS 52  BILITOT 0.8  PROT 6.3*  ALBUMIN 3.2*   No results  for input(s): LIPASE, AMYLASE in the last 168 hours.  Recent Labs Lab 05/08/16 1622  AMMONIA 18    CBC:  Recent Labs Lab 05/08/16 1622 05/08/16 1638  WBC 6.3  --   NEUTROABS 5.0  --   HGB 9.9* 10.5*  HCT 28.8* 31.0*  MCV 86.2  --   PLT 179  --     Cardiac Enzymes: No results for input(s): CKTOTAL, CKMB, CKMBINDEX, TROPONINI in the last 168 hours.  Lipid Panel: No results for input(s): CHOL, TRIG, HDL, CHOLHDL, VLDL, LDLCALC in the last 168 hours.  CBG:  Recent Labs Lab 05/08/16 1448 05/08/16 1728 05/08/16 1827  GLUCAP 400* 309* 178*    Microbiology: Results for orders placed or performed during the hospital encounter of 11/06/13  Surgical pcr screen     Status: None   Collection Time: 11/10/13  5:47 AM  Result Value Ref Range Status   MRSA, PCR NEGATIVE NEGATIVE Final   Staphylococcus aureus NEGATIVE NEGATIVE Final    Comment:        The Xpert SA Assay (FDA approved for NASAL specimens in patients over 33 years of age), is one component of a comprehensive surveillance program.  Test performance has been validated by [employer] for patients greater than or equal to 26 year old. It is not intended to diagnose infection nor to guide or monitor treatment.    Coagulation Studies:  Recent Labs  05/08/16 1622  LABPROT 15.2  INR 1.19    Imaging: Ct Head Wo Contrast  Result Date: 05/08/2016 CLINICAL DATA:  Altered mental status for 5 days. Hypertensive. History of hypertension, diabetes, hypercholesterolemia. EXAM: CT HEAD WITHOUT CONTRAST TECHNIQUE: Contiguous axial images were obtained from the base of the skull through the vertex without intravenous contrast. COMPARISON:  None. FINDINGS: BRAIN: No intraparenchymal hemorrhage, mass effect nor midline shift. Moderate to severe ventriculomegaly on the basis of global parenchymal brain volume loss. Confluent supratentorial and pontine white matter hypodensities. 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 Age  indeterminate LEFT basal ganglia lacunar infarct. No acute large vascular territory infarcts. No abnormal extra-axial fluid collections. Basal cisterns are patent. VASCULAR: Mild calcific atherosclerosis of the  carotid siphons. SKULL: No skull fracture. No significant scalp soft tissue swelling. SINUSES/ORBITS: Lobulated paranasal sinus mucosal thickening. Mastoid air cells are well aerated. The included ocular globes and orbital contents are non-suspicious. OTHER: None. 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 Old appearing LEFT thalamus lacunar infarct. Moderate to severe global brain atrophy and moderate to severe chronic small vessel ischemic disease. Electronically Signed   By: Elon Alas M.D.   On: 05/08/2016 18:20   Dg Chest Portable 1 View  Result Date: 05/08/2016 CLINICAL DATA:  Altered mental status EXAM: PORTABLE CHEST 1 VIEW COMPARISON:  03/15/2014 FINDINGS: There is no focal parenchymal opacity. There is no pleural effusion or pneumothorax. There is stable cardiomegaly. The osseous structures are unremarkable. IMPRESSION: No active disease. Electronically Signed   By: Kathreen Devoid   On: 05/08/2016 16:44    Assessment: 1. Five day history of AMS. DDx includes small subacute infarction not visible on CT head, effects of medications, toxic/metabolic etiologies, unwitnessed seizure with prolonged postictal state, PRES given his presenting severe HTN (not always visible on CT) and hypercarbic/hypoxic encephalopathy given his ABG. Also possible is acute thiamine deficiency given recent low po intake, as well as undiagnosed Lewy body dementia with subacute cognitive fluctuation.  2. CT head radiology report documents an age indeterminate left basal ganglia lacunar infarct as well as an old appearing left thalamus lacunar infarct. On my review of the CT, these findings do not appear likely to have precipitated the patient's aphasia. There may be a new left MCA territory  infarction not yet visible on CT.  3. Also noted on CT is moderate to severe global brain atrophy and moderate to severe chronic small vessel ischemic disease. These findings appear likely to be associated with decreased Neuroloigcal reserve and would likely predispose to AMS in the setting of toxic, metabolic or ischemic insult.  4. Hyperthyroid state per lab results.   Recommendations: 1. EEG (ordered).  2. MRI brain without contrast (ordered).  3. ESR, c-reactive protein.  4. Vitamin B!2 and thiamine levels (ordered). After blood draw, start patient on empiric high-dose thiamine at 250 mg IV TID x 3 days, then 100 mg po TID thereafter.  5. Further evaluation of etiology for abnormal ABG.  6. Continue ASA.  Electronically signed: Dr. Kerney Elbe 05/08/2016, 8:26 PM

## 2016-05-09 ENCOUNTER — Telehealth: Payer: Self-pay | Admitting: Family Medicine

## 2016-05-09 ENCOUNTER — Inpatient Hospital Stay (HOSPITAL_COMMUNITY): Payer: Medicaid Other

## 2016-05-09 DIAGNOSIS — I6789 Other cerebrovascular disease: Secondary | ICD-10-CM

## 2016-05-09 DIAGNOSIS — E1165 Type 2 diabetes mellitus with hyperglycemia: Secondary | ICD-10-CM

## 2016-05-09 LAB — LIPID PANEL
CHOL/HDL RATIO: 5.6 ratio
CHOLESTEROL: 200 mg/dL (ref 0–200)
HDL: 36 mg/dL — ABNORMAL LOW (ref >40)
LDL Cholesterol: 126 mg/dL — ABNORMAL HIGH (ref 0–99)
Triglycerides: 190 mg/dL — ABNORMAL HIGH (ref <150)
VLDL: 38 mg/dL (ref 0–40)

## 2016-05-09 LAB — CBC
HCT: 35.2 % — ABNORMAL LOW (ref 39.0–52.0)
Hemoglobin: 11.9 g/dL — ABNORMAL LOW (ref 13.0–17.0)
MCH: 29.6 pg (ref 26.0–34.0)
MCHC: 33.8 g/dL (ref 30.0–36.0)
MCV: 87.6 fL (ref 78.0–100.0)
Platelets: 203 10*3/uL (ref 150–400)
RBC: 4.02 MIL/uL — ABNORMAL LOW (ref 4.22–5.81)
RDW: 14.2 % (ref 11.5–15.5)
WBC: 6.7 10*3/uL (ref 4.0–10.5)

## 2016-05-09 LAB — ECHOCARDIOGRAM COMPLETE
HEIGHTINCHES: 70 in
Weight: 2536.17 oz

## 2016-05-09 LAB — RENAL FUNCTION PANEL
Albumin: 3.6 g/dL (ref 3.5–5.0)
Anion gap: 14 (ref 5–15)
BUN: 22 mg/dL — AB (ref 6–20)
CALCIUM: 9.2 mg/dL (ref 8.9–10.3)
CHLORIDE: 95 mmol/L — AB (ref 101–111)
CO2: 27 mmol/L (ref 22–32)
CREATININE: 3.68 mg/dL — AB (ref 0.61–1.24)
GFR calc non Af Amer: 15 mL/min — ABNORMAL LOW (ref >60)
GFR, EST AFRICAN AMERICAN: 18 mL/min — AB (ref >60)
Glucose, Bld: 146 mg/dL — ABNORMAL HIGH (ref 65–99)
Phosphorus: 3.2 mg/dL (ref 2.5–4.6)
Potassium: 3.9 mmol/L (ref 3.5–5.1)
SODIUM: 136 mmol/L (ref 135–145)

## 2016-05-09 LAB — GLUCOSE, CAPILLARY
GLUCOSE-CAPILLARY: 142 mg/dL — AB (ref 65–99)
GLUCOSE-CAPILLARY: 169 mg/dL — AB (ref 65–99)
Glucose-Capillary: 133 mg/dL — ABNORMAL HIGH (ref 65–99)
Glucose-Capillary: 120 mg/dL — ABNORMAL HIGH (ref 65–99)

## 2016-05-09 LAB — RAPID URINE DRUG SCREEN, HOSP PERFORMED
Amphetamines: NOT DETECTED
BARBITURATES: NOT DETECTED
BENZODIAZEPINES: NOT DETECTED
Cocaine: NOT DETECTED
Opiates: NOT DETECTED
TETRAHYDROCANNABINOL: NOT DETECTED

## 2016-05-09 LAB — TROPONIN I
TROPONIN I: 0.11 ng/mL — AB (ref <0.03)
Troponin I: 0.1 ng/mL (ref <0.03)

## 2016-05-09 LAB — VITAMIN B12: VITAMIN B 12: 1504 pg/mL — AB (ref 180–914)

## 2016-05-09 MED ORDER — PENTAFLUOROPROP-TETRAFLUOROETH EX AERO: 1.0000 "application " | INHALATION_SPRAY | CUTANEOUS | Status: DC | PRN

## 2016-05-09 MED ORDER — LORAZEPAM 2 MG/ML IJ SOLN
0.5000 mg | Freq: Once | INTRAMUSCULAR | Status: AC | PRN
  Administered 2016-05-10: 0.5 mg via INTRAVENOUS
  Filled 2016-05-09: qty 1

## 2016-05-09 MED ORDER — LIDOCAINE HCL (PF) 1 % IJ SOLN: 5.0000 mL | INTRAMUSCULAR | Status: DC | PRN

## 2016-05-09 MED ORDER — DOXERCALCIFEROL 4 MCG/2ML IV SOLN
INTRAVENOUS | Status: AC
  Administered 2016-05-09: 6 ug via INTRAVENOUS
  Filled 2016-05-09: qty 4

## 2016-05-09 MED ORDER — SODIUM CHLORIDE 0.9 % IV SOLN: 100.0000 mL | INTRAVENOUS | Status: DC | PRN

## 2016-05-09 MED ORDER — ALTEPLASE 2 MG IJ SOLR
2.0000 mg | Freq: Once | INTRAMUSCULAR | Status: DC | PRN
  Filled 2016-05-09: qty 2

## 2016-05-09 MED ORDER — HYDRALAZINE HCL 20 MG/ML IJ SOLN
10.0000 mg | INTRAMUSCULAR | Status: DC | PRN
  Administered 2016-05-11 – 2016-05-30 (×4): 10 mg via INTRAVENOUS
  Filled 2016-05-09 (×5): qty 1

## 2016-05-09 MED ORDER — ASPIRIN 81 MG PO CHEW
81.0000 mg | CHEWABLE_TABLET | Freq: Every day | ORAL | Status: DC
  Administered 2016-05-10 – 2016-05-31 (×16): 81 mg via ORAL
  Filled 2016-05-09 (×19): qty 1

## 2016-05-09 MED ORDER — ASPIRIN 300 MG RE SUPP
300.0000 mg | Freq: Once | RECTAL | Status: DC
  Filled 2016-05-09 (×2): qty 1

## 2016-05-09 MED ORDER — LIDOCAINE-PRILOCAINE 2.5-2.5 % EX CREA: 1.0000 "application " | TOPICAL_CREAM | CUTANEOUS | Status: DC | PRN

## 2016-05-09 MED ORDER — DOXERCALCIFEROL 4 MCG/2ML IV SOLN
6.0000 ug | INTRAVENOUS | Status: DC
  Administered 2016-05-09: 6 ug via INTRAVENOUS
  Filled 2016-05-09 (×3): qty 4

## 2016-05-09 MED ORDER — THIAMINE HCL 100 MG/ML IJ SOLN
250.0000 mg | Freq: Three times a day (TID) | INTRAMUSCULAR | Status: AC
  Administered 2016-05-09 – 2016-05-12 (×8): 250 mg via INTRAVENOUS
  Filled 2016-05-09 (×10): qty 4

## 2016-05-09 MED ORDER — INSULIN DETEMIR 100 UNIT/ML ~~LOC~~ SOLN
12.0000 [IU] | Freq: Every day | SUBCUTANEOUS | Status: DC
  Filled 2016-05-09: qty 0.12

## 2016-05-09 MED ORDER — RENA-VITE PO TABS
1.0000 | ORAL_TABLET | Freq: Every day | ORAL | Status: DC
  Administered 2016-05-10 – 2016-05-30 (×18): 1 via ORAL
  Filled 2016-05-09 (×19): qty 1

## 2016-05-09 MED ORDER — HEPARIN SODIUM (PORCINE) 1000 UNIT/ML DIALYSIS
1000.0000 [IU] | INTRAMUSCULAR | Status: DC | PRN
  Filled 2016-05-09: qty 1

## 2016-05-09 NOTE — Progress Notes (Signed)
Subjective: No acute events overnight. Patient is not alert or oriented. He is unable to participate in the interview. He was at dialysis this morning.  Objective:  Vital signs in last 24 hours: Vitals:   05/09/16 1000 05/09/16 1030 05/09/16 1100 05/09/16 1128  BP: (!) 155/82 (!) 142/95 (!) 138/94   Pulse: 81 80 75 77  Resp: (!) 23 (!) 23 18 17   Temp:      TempSrc:      SpO2:      Weight:      Height:       Physical Exam  Constitutional: He appears well-developed and well-nourished.  HENT:  Head: Normocephalic and atraumatic.  Cardiovascular: Normal rate and regular rhythm.  Exam reveals no friction rub.   No murmur heard. Neurological:  Not alert or oriented to person, place or time. Unable to have him officially participate in the neurological examination although it appears that no cranial nerve deficit is appreciated.     Assessment/Plan:  Principal Problem:   Altered mental state Active Problems:   Type 2 diabetes mellitus (HCC)   Hypertension, renal disease   ESRD on hemodialysis [facility])  Michael Dorsey is a 71 y.o. male with HTN, DM, ESRD on HD, who presents w/ 5 day h/o altered mental status concerning for stroke versus uremia in the setting of missed HD. For more potential etiologies include in the differential diagnosis please see below.  In summary, patient dialysis this morning and he remains on oriented to person, place or time. He does not cooperate with the neurological examination but it does not appear that he has a gross cranial nerve or neurological deficit.   # AMS The patient's altered mental status has an unclear etiology at this time. However, the differential diagnosis includes small infarction  not yet visible on CT head, potential medication effects, metabolic encephalopathies or even potentially an unwitnessed seizure with prolonged postictal state. There were concerns from several consultants and the admitting team for PRES syndrome  given his severe hypertension at admission. Neurology has also considered other etiologies including undiagnosed Lewey body dementia and acute thymin deficiency. Per neurology, his CT shows age-indeterminate infarcts that do not appear consistent with his presentation. The patient would need an MRI for further characterization of old infarcts and to evaluate for potential new infarct that may have been missed on the CT scan. Additionally, pursuing an EEG would be appropriate at this time to rule out potential ongoing seizures. Also, although not favored by nephrology, his altered mental status may be secondary to uremia in the setting of 2 missed dialysis sessions. We'll see if the patient improves with ongoing dialysis. - Telemetry - MRI - EEG - T12 and thymin levels. Per neurology will start empiric high-dose thymin 250 mg IV 3 times a day 3 days following after levels are drawn - Continue aspirin - F/U blood cultures  # IDDM:  Uncontrolled, last HgbA1c 11.8 (07/03/15). Elevated BG today w/o evidence of DKA. HHS unlikely given mild hyperglycemia w/o signs of significant hypovolemia. s/p 8U insulin in ED. Home regimen. - A1c - Novolin 70/30 10U BID - SSI  # Malignant HTN:  Uncontrolled. BP 190/122 on admission. Likely exacerbated by hypervolemia in the setting of missed HD. Chronically may have led to lacunar infarct, however, acute HTN emergency may result in PRES. Will plan for slow correction . - HD per Nephrology - Hydralazine PRN BP > 185/110. - Continue to monitor  # ESRD:  HD on MWF,  missed last two. Nephrology following. Hypertensive w/ elevated BUN. Phos elevated at 5.6. Mg/K wnl. Uremia may be a possible etiology for the patient's presentation. If so, this should improve with ongoing dialysis. - HD this morning - Continue HD recommendations per nephrology  Dispo: Anticipated discharge in approximately 2-3 day(s).   Michael LotJames Clebert Wenger, MD 05/09/2016, 11:43 AM Pager: [phone]

## 2016-05-09 NOTE — Progress Notes (Signed)
[facility] Progress Note   Dialysis Orders: AF MWF   4 hr 450/A 1.5 2 K 2 Ca profile 2 left upper AVF heparin 7100 EDW 76 venofer 50 per week Mircera 50 q 2  - last 2/7 Hect 6 Recent labs hgb 10.Marland Kitchen.8 44% sat ferritin 1900 - STOP venofer due to adequate tsat and ^ ferritin iPTH 492 Ca ok P 5-6 [place] [facility] [place] [facility] [place] for him.  Assessment/Plan: 1. AMS - work up in process; no infectious source identified yet, 2. ESRD -MWF - off schedule due to hosp admission -dialyzing 2/8 - plan next HD Sat and then back on schedule K 3.8 yesterday - labs pending - use 4 K bath 3. Anemia - Hgb 9.9 per CBC 10.5 per H/H - consistent with outpt hgb -; just had Micera 2/7 - no dose for now - stopped venofer due to adequate tsat and ^ ferritin with Jan labs 4. Secondary hyperparathyroidism - continue Hectorol and binders- d/c calcitriol - not on this/resume vitamins 5. HTN/volume -goal 3.5 - he was only 1.6 above EDW yesterday per HD when he dialyzed only 1 hour,  Usual BP pre HD range 140 - 180/90 - 100 and post 160 - 110/70 -80.  His pre HD BP yesterday was higher than usual and he was unable to stand.  Pre HD BP was 236/107 P 72;  Gently lower BP via volume removal - likely needs a lower edw; has coreg 25 bid and imdur 30 on outpt med list- per Neuro note had been refusing meds which could explain why BP was higher than usual, hydralazine prn 6. Nutrition - npo pending Neuro eval/resume vits tomorrow 7. DM with hyperglycemia- hgb A1c 11.2 04/2016 - monthly BS are elevated 300 - 500 -needs better management of this after d/c  Sheffield SliderMartha B Bergman, PA-C [facility] Beeper [phone] 05/09/2016,9:01 AM  LOS: 1 day   Pt seen, examined and agree w A/P as above.  Vinson Moselleob Rashed Edler MD [facility] pager [phone]   05/09/2016, 9:21 AM    Subjective:   Slow to answer questions. Name, [birth year] is Birthday.  Cannot say year.    Objective Vitals:   05/09/16 0614 05/09/16 0740 05/09/16 0743 05/09/16 0748  BP: (!) 179/58 (!) 191/111 (!) 192/100   Pulse: (!) 108 80 80 81  Resp: 20 20 20    Temp: 98.5 F (36.9 C) 98.5 F (36.9 C)    TempSrc: Oral Oral    SpO2: 100% 100%    Weight:  74.7 kg (164 lb 10.9 oz)    Height:       Physical Exam General: NAD on HD Heart: RRR 2/6 murmur Lungs: no rales Abdomen: soft NE Extremities:tr LE edema Neuro: confused, oriented to self, speech slow and somewhat dysarthric- not his baseline Dialysis Access: left upper AVF  Additional Objective Labs: Basic Metabolic Panel:  Recent Labs Lab 05/08/16 1622 05/08/16 1638 05/08/16 1945  NA 132* 134*  --   K 3.8 3.8  --   CL 94* 93*  --   CO2 25  --   --   GLUCOSE 407* 417*  --   BUN 67* 67*  --   CREATININE 6.97* 7.30*  --   CALCIUM 8.7*  --   --   PHOS  --   --  5.6*   Liver Function Tests:  Recent Labs Lab 05/08/16 1622  AST 13*  ALT  15*  ALKPHOS 52  BILITOT 0.8  PROT 6.3*  ALBUMIN 3.2*  CBC:  Recent Labs Lab 05/08/16 1622 05/08/16 1638  WBC 6.3  --   NEUTROABS 5.0  --   HGB 9.9* 10.5*  HCT 28.8* 31.0*  MCV 86.2  --   PLT 179  --     Cardiac Enzymes:  Recent Labs Lab 05/08/16 1955 05/09/16 0229 05/09/16 0803  TROPONINI 0.10* 0.10* 0.11*   CBG:  Recent Labs Lab 05/08/16 1448 05/08/16 1728 05/08/16 1827 05/08/16 2247 05/09/16 0035  GLUCAP 400* 309* 178* 181* 169*    Lab Results  Component Value Date   INR 1.19 05/08/2016   INR 1.14 03/16/2014   INR 1.07 11/07/2013   Studies/Results: Ct Head Wo Contrast  Result Date: 05/08/2016 CLINICAL DATA:  Altered mental status for 5 days. Hypertensive. History of hypertension, diabetes, hypercholesterolemia. EXAM: CT HEAD WITHOUT CONTRAST TECHNIQUE: Contiguous axial images were obtained from the base of the skull through the vertex without intravenous contrast. COMPARISON:  None. FINDINGS: BRAIN: No intraparenchymal hemorrhage, mass  effect nor midline shift. Moderate to severe ventriculomegaly on the basis of global parenchymal brain volume loss. Confluent supratentorial and pontine white matter hypodensities. 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 Age indeterminate LEFT basal ganglia lacunar infarct. No acute large vascular territory infarcts. No abnormal extra-axial fluid collections. Basal cisterns are patent. VASCULAR: Mild calcific atherosclerosis of the carotid siphons. SKULL: No skull fracture. No significant scalp soft tissue swelling. SINUSES/ORBITS: Lobulated paranasal sinus mucosal thickening. Mastoid air cells are well aerated. The included ocular globes and orbital contents are non-suspicious. OTHER: None. 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 Old appearing LEFT thalamus lacunar infarct. Moderate to severe global brain atrophy and moderate to severe chronic small vessel ischemic disease. Electronically Signed   By: Awilda Metro M.D.   On: 05/08/2016 18:20   Dg Chest Portable 1 View  Result Date: 05/08/2016 CLINICAL DATA:  Altered mental status EXAM: PORTABLE CHEST 1 VIEW COMPARISON:  03/15/2014 FINDINGS: There is no focal parenchymal opacity. There is no pleural effusion or pneumothorax. There is stable cardiomegaly. The osseous structures are unremarkable. IMPRESSION: No active disease. Electronically Signed   By: Elige Ko   On: 05/08/2016 16:44   Medications:  . aspirin  81 mg Oral Daily  . aspirin EC  325 mg Oral Once  . calcium acetate  667 mg Oral TID WC  . doxercalciferol  6 mcg Intravenous Q T,Th,Sa-HD  . heparin  5,000 Units Subcutaneous Q8H  . insulin aspart  0-9 Units Subcutaneous TID WC  . insulin aspart protamine- aspart  8 Units Subcutaneous BID WC  . [START ON 05/10/2016] multivitamin  1 tablet Oral QHS

## 2016-05-09 NOTE — Progress Notes (Signed)
PT Cancellation Note  Patient Details Name: Michael BookmanJohn Dorsey MRN: [id number] DOB: 1945/06/15   Cancelled Treatment:    Reason Eval/Treat Not Completed: Patient at procedure or test/unavailable.  Pt in HD in am and then in echo.  Will see as later today or 2/10 as able. 05/09/2016  [place] BingKen Trevyon Dorsey, PT [phone]  (pager)   Michael Dorsey 05/09/2016, 3:01 PM

## 2016-05-09 NOTE — Progress Notes (Signed)
  Echocardiogram 2D Echocardiogram has been performed.  Tina Gruner 05/09/2016, 3:15 PM

## 2016-05-09 NOTE — Progress Notes (Signed)
05/09/16 1417  SLP Visit Information  SLP Received On 05/09/16  SLP Time Calculation  SLP Start Time (ACUTE ONLY) 1340  SLP Stop Time (ACUTE ONLY) 1350  SLP Time Calculation (min) (ACUTE ONLY) 10 min  General Information  HPI Pt is a 71 y.o. male with a PMH of end-stage renal disease requiring hemodialysis, diabetes, hypertension, and hyperlipidemia who presented with a five-day history of altered mental status. He was performing all of his activities of daily living at baseline. After his dialysis session on Friday he began refusing medications and then refused to attend dialysis on Monday. He has not been eating well. CT of head showed age indeterminate LEFT basal ganglia lacunar infarct, old appearing LEFT thalamus lacunar infarct. Moderate to severe global brain atrophy and moderate to severe chronic small vessel ischemic disease. CXR was unremarkable.  Prior Functional Status  Cognitive/Linguistic Baseline WFL  Lives With Family  Vocation Other (comment) Psychiatric nurse)  Pain Assessment  Pain Assessment Faces  Faces Pain Scale 4  Pain Location head (?)  Pain Intervention(s) Limited activity within patient's tolerance;Monitored during session  Oral Motor/Sensory Function  Overall Oral Motor/Sensory Function Other (comment) (difficult to assess)  Cognition  Overall Cognitive Status Impaired/Different from baseline  Arousal/Alertness Lethargic  Orientation Level Disoriented X4  Attention Sustained  Sustained Attention Impaired  Sustained Attention Impairment Verbal basic;Functional basic  Awareness Impaired  Awareness Impairment Emergent impairment  Problem Solving Impaired  Problem Solving Impairment Functional basic  Safety/Judgment Impaired  Auditory Comprehension  Overall Auditory Comprehension Impaired  Yes/No Questions X (responds "mhmm" to most questions/comments/commands)  Basic Biographical Questions 0-25% accurate  Basic Immediate Environment Questions 0-24% accurate   Other Yes/No Questions Comments` Minimal  Commands X  One Step Basic Commands 0-24% accurate  Conversation Simple  Interfering Components Attention  Visual Recognition/Discrimination  Discrimination Not tested  Reading Comprehension  Reading Status Not tested  Expression  Primary Mode of Expression Other (comment) (nonverbal with "mhhm")  Verbal Expression  Overall Verbal Expression Impaired  Initiation Impaired  Automatic Speech Counting (no response during counting)  Level of Generative/Spontaneous Verbalization Word  Repetition Impaired  Level of Impairment Word level  Naming Not tested  Pragmatics Impairment  Impairments Eye contact  Interfering Components Attention  Written Expression  Written Expression Not tested  Motor Speech  Overall Motor Speech Other (comment) (possible Apraxia of Speech (?))  SLP - End of Session  Patient left in bed;with call bell/phone within reach;with family/visitor present  Nurse Communication Cognitive/Linguistic strategies reviewed;Treatment plan  Assessment  Clinical Impression Statement (ACUTE ONLY) Mr. Malecha appeared lethargic and disoriented X4. His daughter reported that his current language and cognitive abilities are not at his baseline. He exhibited s/s of nonfluent aphasia characterized by significantly decreased verbal output (responds with "mhmm" only). The extent to which comprehension is impacted is unclear, however, exhibited significant difficulty comprehending basic information (unable to follow 1-step commands). Written and verbal cues provided by the SLP did not prove to be beneficial. An MRI has been ordered. ST will diagnostically treat for language/cognition once pt's alertness has increased.  SLP Recommendation/Assessment Patient needs continued Speech Lanaguage Pathology Services  Problem List Auditory comprehension;Verbal expression;Attention;Orientation  Plan  Speech Therapy Frequency (ACUTE ONLY) min 2x/week   Duration 2 weeks  Treatment/Interventions Language facilitation;Multimodal communcation approach;Compensatory strategies;Patient/family education;Functional tasks  Potential to Achieve Goals (ACUTE ONLY) Fair  Potential Considerations (ACUTE ONLY) Severity of impairments  SLP Recommendations  Follow up Recommendations Other (comment) (TBD)  Individuals Consulted  Consulted and Agree  with Results and Recommendations Family member/caregiver  Family Member Consulted  daughter   Macarthur CritchleyMeredith Mac Dowdell , Arkansastudent-SLP

## 2016-05-09 NOTE — Progress Notes (Signed)
Spoke with daughter about patients baseline. Before Friday patient was Alert and Oriented and able to walk without assistance. Friday noticeable change in patients conditions; family had to help patient out of bus following dialysis. Saturday patient was refusing food but still verbal. Monday patient refused dialysis, refused medication and began having incontinent episodes. Wednesday patient went to dialysis where RN sent him to the ED.

## 2016-05-09 NOTE — Telephone Encounter (Signed)
Patient is hospitalized and they would like to discuss his mental status prior to hospitalization...  Please follow up.Michael Dorsey..Michael Dorsey

## 2016-05-09 NOTE — Progress Notes (Signed)
Inpatient Diabetes Program Recommendations  AACE/ADA: New Consensus Statement on Inpatient Glycemic Control (2015)  Target Ranges:  Prepandial:   less than 140 mg/dL      Peak postprandial:   less than 180 mg/dL (1-2 hours)      Critically ill patients:  140 - 180 mg/dL   Lab Results  Component Value Date   GLUCAP 169 (H) 05/09/2016   HGBA1C 11.8 07/13/2015    Review of Glycemic Control Results for Michael BookmanNANE-BROBBEY, Michael (MRN [id number]) as of 05/09/2016 13:33  Ref. Range 05/08/2016 14:48 05/08/2016 17:28 05/08/2016 18:27 05/08/2016 22:47 05/09/2016 00:35  Glucose-Capillary Latest Ref Range: 65 - 99 mg/dL 657400 (H) 846309 (H) 962178 (H) 181 (H) 169 (H)   Diabetes history: DM Outpatient Diabetes medications: 70/30 20 units bid Current orders for Inpatient glycemic control: 70/30 8 units bid + Novolog correction 0-9 units tid  Inpatient Diabetes Program Recommendations:   Due to patient NPO currently, please consider changing patient to basal Levemir 12 units. Will follow.  Page to Dr. Ladona Ridgelaylor to review recs.  Thank you, Billy FischerJudy E. Brelee Renk, RN, MSN, CDE Inpatient Glycemic Control Team Team Pager [phone] (8am-5pm) 05/09/2016 1:44 PM

## 2016-05-09 NOTE — Telephone Encounter (Signed)
Addressed.

## 2016-05-09 NOTE — Plan of Care (Signed)
Problem: Education: Goal: Knowledge of [facility] Education information/materials will improve Outcome: Progressing POC reviewed with pt./family; unsure how much pt. understood, but family did; daughter speaks [place] well and pt. with some aphasia and language barrier.

## 2016-05-09 NOTE — Progress Notes (Signed)
Pt. transported from ER via stretcher to 6E- 24; pt. not answering questions- family with pt. and and didn't stay long; daughter did ask pt. some questions in their language/ and how did he feel and he did say "fine". Language barrier/ aphasia noted.

## 2016-05-09 NOTE — Progress Notes (Signed)
Patient has been in dialysis this morning and unavailable for EEG. Will attempt as schedule permits.

## 2016-05-09 NOTE — Evaluation (Signed)
Clinical/Bedside Swallow Evaluation Patient Details  Name: Alessander Sikorski MRN: [id number] Date of Birth: May 02, 1945  Today's Date: 05/09/2016 Time: SLP Start Time (ACUTE ONLY): 1350 SLP Stop Time (ACUTE ONLY): 1405 SLP Time Calculation (min) (ACUTE ONLY): 15 min  Past Medical History:  Past Medical History:  Diagnosis Date  . CHF (congestive heart failure) (HCC)   . Diabetes mellitus without complication (HCC)   . Diastolic dysfunction    heart failure with preserved LV function  . Hypercholesteremia   . Hypertension   . Renal disorder    in process of being diagnosed   Past Surgical History:  Past Surgical History:  Procedure Laterality Date  . AV FISTULA PLACEMENT Left 11/10/2013   Procedure: LEFT ARM  ARTERIOVENOUS (AV) FISTULA CREATION;  Surgeon: Larina Earthly, MD;  Location: [facility] OR;  Service: Vascular;  Laterality: Left;  . FISTULA SUPERFICIALIZATION Left 12/25/2015   Procedure: LEFT FISTULA PLICATION;  Surgeon: Fransisco Hertz, MD;  Location: [facility] OR;  Service: Vascular;  Laterality: Left;  . NEPHRECTOMY     [facility]   HPI:  Pt is a 71 y.o. male with a PMH of end-stage renal disease requiring hemodialysis, diabetes, hypertension, and hyperlipidemia who presented with a five-day history of altered mental status. He was performing all of his activities of daily living at baseline. After his dialysis session on Friday he began refusing medications and then refused to attend dialysis on Monday. He has not been eating well. CT of head showed age indeterminate LEFT basal ganglia lacunar infarct, old appearing LEFT thalamus lacunar infarct. Moderate to severe global brain atrophy and moderate to severe chronic small vessel ischemic disease. CXR was unremarkable.   Assessment / Plan / Recommendation Clinical Impression  [name]'s daughter reported that her father has not exhibited swallowing difficulties at baseline or in the past. A full oral motor assessment was unable to be  completed due to the pt's decreased alertness and inability to follow commands. SLP administered trials of thin liquids via cup/straw and pureed solids resulting in multiple swallows, immediate throat clearing, and delayed throat clearing with both tested consistencies at bedside. An instrumental swallowing evaluation (MBS) would be beneficial to assess the pt's swallow functioning-MBS will be scheduled for 05/10/2016. Recommend continue NPO as pt is at risk for aspiration. ST will f/u to assess safety/efficiency of swallowing mechanism.    Aspiration Risk  Moderate aspiration risk    Diet Recommendation NPO        Other  Recommendations Oral Care Recommendations: Oral care QID   Follow up Recommendations Other (comment) (TBD)      Frequency and Duration min 2x/week  2 weeks       Prognosis Prognosis for Safe Diet Advancement: Good Barriers to Reach Goals: Cognitive deficits      Swallow Study   General HPI: Pt is a 71 y.o. male with a PMH of end-stage renal disease requiring hemodialysis, diabetes, hypertension, and hyperlipidemia who presented with a five-day history of altered mental status. He was performing all of his activities of daily living at baseline. After his dialysis session on Friday he began refusing medications and then refused to attend dialysis on Monday. He has not been eating well. CT of head showed age indeterminate LEFT basal ganglia lacunar infarct, old appearing LEFT thalamus lacunar infarct. Moderate to severe global brain atrophy and moderate to severe chronic small vessel ischemic disease. CXR was unremarkable. Type of Study: Bedside Swallow Evaluation Previous Swallow Assessment: none Diet Prior to this Study: NPO  Temperature Spikes Noted: No Respiratory Status: Room air History of Recent Intubation: No Behavior/Cognition: Confused;Lethargic/Drowsy;Requires cueing Oral Cavity Assessment: Other (comment) (unable to assess) Oral Care Completed by SLP:  No Vision: Functional for self-feeding Self-Feeding Abilities: Needs assist;Needs set up Patient Positioning: Upright in bed Baseline Vocal Quality: Normal Volitional Cough: Cognitively unable to elicit Volitional Swallow: Unable to elicit    Oral/Motor/Sensory Function Overall Oral Motor/Sensory Function: Other (comment) (difficult to assess)   Ice Chips Ice chips: Not tested   Thin Liquid Thin Liquid: Impaired Presentation: Cup;Straw Pharyngeal  Phase Impairments: Throat Clearing - Immediate;Throat Clearing - Delayed;Multiple swallows    Nectar Thick Nectar Thick Liquid: Not tested   Honey Thick Honey Thick Liquid: Not tested   Puree Puree: Impaired Presentation: Spoon Pharyngeal Phase Impairments: Throat Clearing - Delayed;Throat Clearing - Immediate;Multiple swallows   Solid   GO   Solid: Not tested        Macarthur CritchleyMeredith Allan Bacigalupi , Student-SLP 05/09/2016,3:25 PM

## 2016-05-09 NOTE — Progress Notes (Signed)
Telemetry called approximately 2pm, patient had frequent  PVC's and wide QRS complexes. MD notified. Now telemetry called back reporting T-wave depression. MD notified. Will get repeat EKG when patient returns from echo

## 2016-05-10 ENCOUNTER — Inpatient Hospital Stay (HOSPITAL_COMMUNITY): Payer: Medicaid Other

## 2016-05-10 DIAGNOSIS — R4182 Altered mental status, unspecified: Secondary | ICD-10-CM

## 2016-05-10 LAB — VAS US CAROTID
LCCADSYS: -56 cm/s
LEFT ECA DIAS: -8 cm/s
LEFT VERTEBRAL DIAS: 8 cm/s
Left CCA dist dias: -7 cm/s
Left CCA prox dias: 10 cm/s
Left CCA prox sys: 84 cm/s
Left ICA dist dias: -11 cm/s
Left ICA dist sys: -54 cm/s
Left ICA prox dias: -11 cm/s
Left ICA prox sys: -28 cm/s
RCCADSYS: -68 cm/s
RCCAPDIAS: 4 cm/s
RCCAPSYS: 56 cm/s
RIGHT ECA DIAS: -5 cm/s
RIGHT VERTEBRAL DIAS: 6 cm/s

## 2016-05-10 LAB — CBC
HCT: 34.1 % — ABNORMAL LOW (ref 39.0–52.0)
Hemoglobin: 11.2 g/dL — ABNORMAL LOW (ref 13.0–17.0)
MCH: 29.1 pg (ref 26.0–34.0)
MCHC: 32.8 g/dL (ref 30.0–36.0)
MCV: 88.6 fL (ref 78.0–100.0)
PLATELETS: 209 10*3/uL (ref 150–400)
RBC: 3.85 MIL/uL — ABNORMAL LOW (ref 4.22–5.81)
RDW: 14.5 % (ref 11.5–15.5)
WBC: 6.6 10*3/uL (ref 4.0–10.5)

## 2016-05-10 LAB — HEMOGLOBIN A1C
Hgb A1c MFr Bld: 12.3 % — ABNORMAL HIGH (ref 4.8–5.6)
Mean Plasma Glucose: 306 mg/dL

## 2016-05-10 LAB — GLUCOSE, CAPILLARY
GLUCOSE-CAPILLARY: 47 mg/dL — AB (ref 65–99)
GLUCOSE-CAPILLARY: 154 mg/dL — AB (ref 65–99)
Glucose-Capillary: 78 mg/dL (ref 65–99)
Glucose-Capillary: 186 mg/dL — ABNORMAL HIGH (ref 65–99)
Glucose-Capillary: 213 mg/dL — ABNORMAL HIGH (ref 65–99)

## 2016-05-10 LAB — RENAL FUNCTION PANEL
ALBUMIN: 3.2 g/dL — AB (ref 3.5–5.0)
Anion gap: 12 (ref 5–15)
BUN: 31 mg/dL — AB (ref 6–20)
CO2: 27 mmol/L (ref 22–32)
CREATININE: 5.21 mg/dL — AB (ref 0.61–1.24)
Calcium: 9.2 mg/dL (ref 8.9–10.3)
Chloride: 98 mmol/L — ABNORMAL LOW (ref 101–111)
GFR calc Af Amer: 12 mL/min — ABNORMAL LOW (ref >60)
GFR calc non Af Amer: 10 mL/min — ABNORMAL LOW (ref >60)
GLUCOSE: 81 mg/dL (ref 65–99)
Phosphorus: 5 mg/dL — ABNORMAL HIGH (ref 2.5–4.6)
Potassium: 3.7 mmol/L (ref 3.5–5.1)
Sodium: 137 mmol/L (ref 135–145)

## 2016-05-10 LAB — THYROTROPIN RECEPTOR AUTOABS

## 2016-05-10 LAB — T4, FREE: FREE T4: 1.23 ng/dL — AB (ref 0.61–1.12)

## 2016-05-10 LAB — T3, FREE: T3 FREE: 1.4 pg/mL — AB (ref 2.0–4.4)

## 2016-05-10 LAB — TSH: TSH: 0.12 u[IU]/mL — ABNORMAL LOW (ref 0.350–4.500)

## 2016-05-10 MED ORDER — SODIUM CHLORIDE 0.45 % IV SOLN
INTRAVENOUS | Status: DC
  Administered 2016-05-10: 16:00:00 via INTRAVENOUS

## 2016-05-10 MED ORDER — DEXTROSE 50 % IV SOLN
INTRAVENOUS | Status: AC
  Administered 2016-05-10: 50 mL
  Filled 2016-05-10: qty 50

## 2016-05-10 MED ORDER — CLONIDINE HCL 0.2 MG/24HR TD PTWK
0.2000 mg | MEDICATED_PATCH | TRANSDERMAL | Status: DC
  Administered 2016-05-10: 0.2 mg via TRANSDERMAL
  Filled 2016-05-10: qty 1

## 2016-05-10 NOTE — Progress Notes (Signed)
PT Cancellation Note  Patient Details Name: Michael BookmanJohn Anane-Brobbey MRN: [id number] DOB: May 08, 1945   Cancelled Treatment:    Reason Eval/Treat Not Completed: Patient at procedure or test/unavailable. Pt leaving room to go for MRI. RN noted family is to be here this pm and can assist with translating.   Scherrie NovemberLynn P Glade Strausser, PT 05/10/2016, 10:53 AM

## 2016-05-10 NOTE — Progress Notes (Signed)
Pt admitted to unit as transfer from 6E. Pt alert and verbally responsive but remains confused. Telemetry applied and verified with CCMD: NT called to second verify. Bed alarm on; Reported off to oncoming RN. Arabella MerlesP. Amo Dailey Alberson RN.

## 2016-05-10 NOTE — Care Management Note (Signed)
Case Management Note  Patient Details  Name: Michael BookmanJohn Dorsey MRN: [id number] Date of Birth: Nov 22, 1945  Subjective/Objective:                  Patient was admitted with altered mental status. Lives at home and is independent with ADLs at baseline. Patient is a MWF HD patient. He is currently listed as self-pay and is being followed by Madelaine BhatAdam in financial counseling. CM will follow for discharge needs pending patient's progress and physician orders.   Action/Plan:   Expected Discharge Date:                  Expected Discharge Plan:     In-House Referral:     Discharge planning Services     Post Acute Care Choice:    Choice offered to:     DME Arranged:    DME Agency:     HH Arranged:    HH Agency:     Status of Service:     If discussed at [employer] Length of Stay Meetings, dates discussed:    Additional Comments:  Anda KraftRobarge, Koren Sermersheim C, RN 05/10/2016, 9:34 AM

## 2016-05-10 NOTE — Progress Notes (Signed)
[facility] Progress Note   Subjective: not responding verbally  Vitals:   05/09/16 2247 05/10/16 0000 05/10/16 0225 05/10/16 1427  BP: (!) 186/96 (!) 184/94 (!) 175/97 (!) 159/83  Pulse: 76  83 75  Resp: 20  18 19   Temp: 99.3 F (37.4 C)  98.4 F (36.9 C) 98.3 F (36.8 C)  TempSrc: Oral  Oral Oral  SpO2: 100%  98% 99%  Weight:      Height:        Inpatient medications: . aspirin  81 mg Oral Daily  . aspirin  300 mg Rectal Once  . calcium acetate  667 mg Oral TID WC  . doxercalciferol  6 mcg Intravenous Q T,Th,Sa-HD  . heparin  5,000 Units Subcutaneous Q8H  . insulin aspart  0-9 Units Subcutaneous TID WC  . insulin detemir  12 Units Subcutaneous QHS  . multivitamin  1 tablet Oral QHS  . thiamine injection  250 mg Intravenous TID    sodium chloride, sodium chloride, acetaminophen **OR** acetaminophen (TYLENOL) oral liquid 160 mg/5 mL **OR** acetaminophen, alteplase, heparin, hydrALAZINE, lidocaine (PF), lidocaine-prilocaine, pentafluoroprop-tetrafluoroeth  Exam: Awakens but very confused and not responding much verbally, somnolent No jvd Chest clear bilat RRR 2/6 murmur Abd soft ntnd Ext no LE edema LUA AVF+ bruit Neuro - responds to voice , opens eyes, falls back asleep  Dialysis: MWF AF  4h  2/2 bath  P2  LUA AVF   76kg   Hep 7100 Venofer 50/wk Mircera 50 q 2, last 2.7 Hect 6 ug      Assessment: 1. AMS/ aphasia - unclear cause, neuro is working up 2. ESRD HD mwf 3. Volume is well below dry wt - will start maint IVF's 4. HTN - BP's high, will start Catapres patch for now (not reliable to swallow pills) 5. 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  HD tomorrow off schedule then resume MWF.     Vinson Moselleob Jalon Squier MD [facility] pager [phone]   05/10/2016, 2:40 PM    Recent Labs Lab 05/08/16 1622 05/08/16 1638 05/08/16 1945 05/09/16 1307 05/10/16 0213  NA 132*  134*  --  136 137  K 3.8 3.8  --  3.9 3.7  CL 94* 93*  --  95* 98*  CO2 25  --   --  27 27  GLUCOSE 407* 417*  --  146* 81  BUN 67* 67*  --  22* 31*  CREATININE 6.97* 7.30*  --  3.68* 5.21*  CALCIUM 8.7*  --   --  9.2 9.2  PHOS  --   --  5.6* 3.2 5.0*    Recent Labs Lab 05/08/16 1622 05/09/16 1307 05/10/16 0213  AST 13*  --   --   ALT 15*  --   --   ALKPHOS 52  --   --   BILITOT 0.8  --   --   PROT 6.3*  --   --   ALBUMIN 3.2* 3.6 3.2*    Recent Labs Lab 05/08/16 1622 05/08/16 1638 05/09/16 1307 05/10/16 0213  WBC 6.3  --  6.7 6.6  NEUTROABS 5.0  --   --   --   HGB 9.9* 10.5* 11.9* 11.2*  HCT 28.8* 31.0* 35.2* 34.1*  MCV 86.2  --  87.6 88.6  PLT 179  --  203 209   Iron/TIBC/Ferritin/ %Sat No results found for: IRON,  TIBC, FERRITIN, IRONPCTSAT

## 2016-05-10 NOTE — Progress Notes (Signed)
Modified Barium Swallow Progress Note  Patient Details  Name: Michael Dorsey MRN: [id number] Date of Birth: Jul 05, 1945  Today's Date: 05/10/2016  Modified Barium Swallow completed.  Full report located under Chart Review in the Imaging Section.  Brief recommendations include the following:  Clinical Impression  [name] was seen for a MBS due to concerns during bedside swallow eval. His alertness, language production, and language comprehension has improved significantly compared to the previous day. He exhibits a very mild oropharyngeal dysphagia and did not exhibit penetration/aspiration during tested consistencies (thin liquids via cup/straw, regular solids, barium pill). Delayed swallow initiation to the pyriform sinuses and valleculae was noted with thin liquids via cup/straw. Prolonged mastication, prolonged oral transit, and vallecular residue during trials of regular solids, however mild and functional. Trials of the barium pill with thin liquids were completed which did not result in penetration/aspiration. Educated pt re: results of the MBS, his swallowing function, and diet recommendations. Recommend diet of regular solids, thin liquids (straws ok), whole meds with liquids. ST not needed for dysphagia treatment at this time.   Swallow Evaluation Recommendations       SLP Diet Recommendations: Regular solids;Thin liquid   Liquid Administration via: Cup;Straw   Medication Administration: Whole meds with liquid   Supervision: Staff to assist with self feeding;Intermittent supervision to cue for compensatory strategies   Compensations: Minimize environmental distractions;Slow rate;Small sips/bites   Postural Changes: Seated upright at 90 degrees   Oral Care Recommendations: Oral care BID        Royce MacadamiaLitaker, Sunni Richardson Willis 05/10/2016,1:31 PM   Breck CoonsLisa Willis InezLitaker M.Ed [employer] Pager [phone]

## 2016-05-10 NOTE — Evaluation (Signed)
Occupational Therapy Evaluation Patient Details Name: Michael BookmanJohn Dorsey MRN: [id number] DOB: 11-Sep-1945 Today's Date: 05/10/2016    History of Present Illness 71 year old man with a history of end-stage renal disease requiring hemodialysis, diabetes, hypertension, and hyperlipidemia who presents with a five-day history of altered mental status. MRI on 2/9 showed no acute intracranial abnormality.    Clinical Impression   Per RN; family reports pt was independent with ADL PTA and spoke English fluently although it is his second language. Currently pt total assist for bed mobility and ADL although pt able to perform grooming activity at bed level with set up and max multimodal cues. Pt presenting with impaired cognition and communication impacting his independence and safety with ADL and functional mobility. Recommending CIR level therapies to maximize independence and safety with ADL and functional mobility prior to return home. Pt would benefit from continued skilled OT to address established goals.    Follow Up Recommendations  CIR;Supervision/Assistance - 24 hour    Equipment Recommendations  Other (comment) (TBD at next venue)    Recommendations for Other Services PT consult;Rehab consult     Precautions / Restrictions Precautions Precautions: Fall Restrictions Weight Bearing Restrictions: No      Mobility Bed Mobility Overal bed mobility: Needs Assistance Bed Mobility: Supine to Sit;Sit to Supine     Supine to sit: Total assist Sit to supine: Total assist   General bed mobility comments: Total assist for bed mobility due to decreased ability to follow commands.  Transfers                 General transfer comment: Not assessed this session    Balance Overall balance assessment: Needs assistance Sitting-balance support: Feet supported;No upper extremity supported Sitting balance-Leahy Scale: Good                                      ADL  Overall ADL's : Needs assistance/impaired     Grooming: Set up;Supervision/safety;Bed level;Cueing for sequencing;Wash/dry face Grooming Details (indicate cue type and reason): Max multimodal cues for initiation of task                               General ADL Comments: Overall total assist for ADL due to decreased cognition and difficulty following commands.     Vision Additional Comments: Difficult to assess due to impaired cognition. Needs further assessment.   Perception     Praxis      Pertinent Vitals/Pain Pain Assessment: Faces Faces Pain Scale: No hurt     Hand Dominance     Extremity/Trunk Assessment Upper Extremity Assessment Upper Extremity Assessment: Difficult to assess due to impaired cognition (active movement noted in bil UEs, PROM WFL)   Lower Extremity Assessment Lower Extremity Assessment: Defer to PT evaluation   Cervical / Trunk Assessment Cervical / Trunk Assessment: Kyphotic   Communication Communication Communication: Receptive difficulties;Expressive difficulties;Prefers language other than English (English is his second language but he did speak fluently PTA)   Cognition Arousal/Alertness: Awake/alert;Lethargic (initially lethargic, then alert once sitting EOB) Behavior During Therapy: Flat affect Overall Cognitive Status: Impaired/Different from baseline Area of Impairment: Orientation;Following commands;Problem solving Orientation Level: Disoriented to;Place;Time;Situation     Following Commands: Follows one step commands inconsistently;Follows one step commands with increased time     Problem Solving: Slow processing;Decreased initiation;Difficulty sequencing;Requires verbal cues;Requires tactile cues General Comments:  Pt able to verbalize name and "no" when asked if he was in pain. Able to wash face with washcloth with cues and increased time. Unable to give thumbs up or perform AROM to command.   General Comments        Exercises       Shoulder Instructions      Home Living Family/patient expects to be discharged to:: Unsure                                 Additional Comments: Pt unable to provide home set up and no family present. Per RN; pt from home with family.      Prior Functioning/Environment          Comments: Per RN; pt independent PTA. 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        OT Problem List: Decreased activity tolerance;Impaired balance (sitting and/or standing);Impaired vision/perception;Decreased cognition;Decreased safety awareness;Decreased knowledge of use of DME or AE   OT Treatment/Interventions: Self-care/ADL training;Energy conservation;DME and/or AE instruction;Therapeutic activities;Cognitive remediation/compensation;Visual/perceptual remediation/compensation;Patient/family education;Balance training    OT Goals(Current goals can be found in the care plan section) Acute Rehab OT Goals Patient Stated Goal: pt unable to state OT Goal Formulation: Patient unable to participate in goal setting Time For Goal Achievement: 05/24/16 Potential to Achieve Goals: Good ADL Goals Pt Will Perform Grooming: with supervision;sitting Additional ADL Goal #1: Pt will follow one step command 75% of the time in a non distracting environment. Additional ADL Goal #2: Pt will correctly identify 3/5 ADL items with min verbal cues.  OT Frequency: Min 2X/week   Barriers to D/C:            Co-evaluation              End of Session Nurse Communication: Mobility status  Activity Tolerance: Patient tolerated treatment well Patient left: in bed;with call bell/phone within reach;with bed alarm set   Time: 4132-4401 OT Time Calculation (min): 18 min Charges:  OT General Charges $OT Visit: 1 Procedure OT Evaluation $OT Eval Moderate Complexity: 1 Procedure G-Codes:     Gaye Alken M.S., OTR/L Pager: [phone]  05/10/2016, 4:14 PM

## 2016-05-10 NOTE — Progress Notes (Signed)
Pt had an order for Levemir Insulin 12units at 2200, pt NPO for Barium swallow in AM, Dr on call paged and notified, okayed to hold insulin for tonight, pt reassured, will continue to observe. Obasogie-Asidi, Arlyne Brandes Efe

## 2016-05-10 NOTE — Progress Notes (Signed)
Subjective: No acute events overnight. Patient is still not alert or oriented. He was unable to participate in the exam this morning.  Objective:  Vital signs in last 24 hours: Vitals:   05/09/16 2109 05/09/16 2247 05/10/16 0000 05/10/16 0225  BP: (!) 126/98 (!) 186/96 (!) 184/94 (!) 175/97  Pulse: 71 76  83  Resp: 19 20  18   Temp: 98.6 F (37 C) 99.3 F (37.4 C)  98.4 F (36.9 C)  TempSrc: Oral Oral  Oral  SpO2: 100% 100%  98%  Weight: 158 lb 1.1 oz (71.7 kg)     Height:       Physical Exam  Constitutional: He appears well-developed and well-nourished.  HENT:  Head: Normocephalic and atraumatic.  Cardiovascular: Normal rate and regular rhythm.  Exam reveals no friction rub.   No murmur heard. Musculoskeletal: He exhibits no edema.  Neurological:  Not alert or oriented to person, place or time. Unable to have him officially participate in the neurological examination although it appears that no cranial nerve deficit is appreciated.     Assessment/Plan:  Principal Problem:   Altered mental state Active Problems:   Type 2 diabetes mellitus (HCC)   Hypertension, renal disease   ESRD on hemodialysis [facility](HCC)  Mr. Caesar BookmanJohn Anane-Brobbey is a 71 y.o. male with HTN, DM, ESRD on HD, who presents w/ 5 day h/o altered mental status concerning for stroke versus uremia in the setting of missed HD. For more potential etiologies include in the differential diagnosis please see below.  In summary, patient is still not alert or oriented. His mental status does not see much improved from prior. The exact etiology of his altered mental status remains unclear at this time. Lab work remained unremarkable. I do not think uremia was significantly contributing to his altered mental status. Interestingly, his TSH was decreased, free T4 increased but his free T3 was decreased. We will repeat these labs today to ensure that there is not a lab error. Additionally, we'll proceed with EEG today and thyroid  uptake scan for further evaluation. Hopefully, we will be able to get an MRI to further evaluate if the patient had a stroke or other intracranial abnormality which may be leading to his altered mental status.    # AMS The patient remains altered. The etiology of his altered mental status remains unclear. His thyroid hormone panel was conflicting. We'll repeat these labs as they seem discordant. There is still concern that the patient may have had a seizure and is postictal. He'll proceed with EEG today. Additionally, we'll further evaluate his thyroid with a thyroid uptake scan. Additionally I still think there is concern that the patient may have had a stroke and is explaining his symptoms. We will hopefully get an MRI with Ativan if he is able to cooperate with the examination. We will follow-up the results of his EEG. We'll follow-up consultant recommendations and also await for the results of the thyroid uptake scan. - Telemetry - MRI- hopefully today with the assistance of Ativan - EEG- today  - B12 elevated  - thiamine IV 250 mg 3 times a day - Continue aspirin - F/U blood cultures- currently no growth to date   # IDDM:  - A1c - Novolin 70/30 10U BID - SSI  # Malignant HTN:  Uncontrolled. BP 190/122 on admission. His blood pressure remains elevated and his most recent blood pressure was 175/97. He will continue with scheduled HD per nephrology. He has a when necessary hydralazine  order as below. - HD per Nephrology - Hydralazine PRN BP > 185/110. - Continue to monitor  # ESRD:  HD on MWF, missed last two. current BUN of 81 and creatinine of 5.21. There was concern that his altered mental status may be secondary to uremia at presentation however given his recent dialysis and improvements in his BUN I no longer think this is a leading etiology for the patient's presentation. We will continue with dialysis as scheduled. - HD this morning - Continue HD recommendations per  nephrology  DVT/PE prophylaxis: Heparin FEN/GI: Carb modified  Dispo: Anticipated discharge pending clinical course  [name], MD 05/10/2016, 11:18 AM Pager: [phone]

## 2016-05-10 NOTE — Progress Notes (Signed)
Rehab Admissions Coordinator Note:  Patient was screened by Clois DupesBoyette, Tasheena Wambolt Godwin for appropriateness for an Inpatient Acute Rehab Consult per OT recommendation.  At this time, we are recommending Inpatient Rehab consult. Please place order if you would like pt assessed for possible admission.   Clois DupesBoyette, Mckinzey Entwistle Godwin 05/10/2016, 4:22 PM  I can be reached at [phone].

## 2016-05-10 NOTE — Progress Notes (Signed)
OT Cancellation Note  Patient Details Name: Caesar BookmanJohn Anane-Brobbey MRN: [id number] DOB: 1946-02-24   Cancelled Treatment:    Reason Eval/Treat Not Completed: Patient at procedure or test/ unavailable (at EEG then to Vascular)  Felecia ShellingJones, Ted Goodner B   Darryle Dennie, Brynn   OTR/L Pager: [phone] Office: [phone] .  05/10/2016, 8:48 AM

## 2016-05-10 NOTE — Progress Notes (Signed)
PT Cancellation Note  Patient Details Name: Michael BookmanJohn Dorsey MRN: [id number] DOB: 08-05-1945   Cancelled Treatment:    Reason Eval/Treat Not Completed: Patient at procedure or test/unavailable   Zena AmosLynn P Reesha Debes, PT 05/10/2016, 9:32 AM

## 2016-05-10 NOTE — Progress Notes (Signed)
Hypoglycemic Event  CBG: 47  Treatment: D50 IV 50 mL  Symptoms: Shaky  Follow-up CBG: Time:0650 CBG Result:154  Possible Reasons for Event: Other: PT NPO  Comments/MD notified:Dr Strelow    Michael Dorsey, Michael Dorsey

## 2016-05-10 NOTE — Progress Notes (Signed)
Inpatient Diabetes Program Recommendations  AACE/ADA: New Consensus Statement on Inpatient Glycemic Control (2015)  Target Ranges:  Prepandial:   less than 140 mg/dL      Peak postprandial:   less than 180 mg/dL (1-2 hours)      Critically ill patients:  140 - 180 mg/dL   Lab Results  Component Value Date   GLUCAP 78 05/10/2016   HGBA1C 12.3 (H) 05/08/2016    Review of Glycemic Control  Diabetes history: DM 2 Outpatient Diabetes medications: 70/30 20 units BID with meals Current orders for Inpatient glycemic control: Levemir 12 units, Novolog Sensitive TID  Inpatient Diabetes Program Recommendations:   Fasting glucose 47 this am with Levemir 12 units. Please consider decreasing basal insulin dose to Levemir 8 units.  Thanks,  Christena DeemShannon Koltyn Kelsay RN, MSN, [facility] Inpatient Diabetes Coordinator Team Pager [phone] (8a-5p)

## 2016-05-10 NOTE — Progress Notes (Signed)
*  PRELIMINARY RESULTS* Vascular Ultrasound Carotid Duplex (Doppler) has been completed. Study was technically difficult due to patient movement. Findings suggest 1-39% internal carotid artery stenosis bilaterally. Vertebral arteries are patent with antegrade flow.  05/10/2016 9:51 AM Gertie FeyMichelle Leona Alen, BS, RVT, RDCS, RDMS

## 2016-05-10 NOTE — Procedures (Signed)
ELECTROENCEPHALOGRAM REPORT  Date of Study: 05/10/2016  Patient's Name: Michael Dorsey MRN: [id number] Date of Birth: [birth year]  Referring Provider: Dr. Caryl PinaEric Lindzen  Clinical History: This is a 71 year old man with altered mental status.  Medications: acetaminophen (TYLENOL) tablet 650 mg  aspirin chewable tablet 81 mg  calcium acetate (PHOSLO) capsule 667 mg  doxercalciferol (HECTOROL) injection 6 mcg  hydrALAZINE (APRESOLINE) injection 10 mg  insulin aspart (novoLOG) injection 0-9 Units  insulin detemir (LEVEMIR) injection 12 Units  multivitamin (RENA-VIT) tablet 1 tablet  thiamine (B-1) injection 250 mg   Technical Summary: A multichannel digital EEG recording measured by the international 10-20 system with electrodes applied with paste and impedances below 5000 ohms performed as portable with EKG monitoring in an awake and asleep patient.  Hyperventilation and photic stimulation were not performed.  The digital EEG was referentially recorded, reformatted, and digitally filtered in a variety of bipolar and referential montages for optimal display.   Description: The patient is awake and asleep during the recording. He is noted to be unresponsive to questions.  During maximal wakefulness, there is a symmetric, medium voltage 7-7.5 Hz posterior dominant rhythm that attenuates with eye opening. This is admixed with a moderate amount of diffuse 4-6 Hz theta and 2-3 Hz delta slowing of the waking background.  During drowsiness and sleep, there is an increase in theta and delta slowing of the background with poorly formed vertex waves seen.  Hyperventilation and photic stimulation were not performed.  There were no epileptiform discharges or electrographic seizures seen.    EKG lead was unremarkable.  Impression: This awake and asleep EEG is abnormal due to moderate diffuse slowing of the waking background.  Clinical Correlation of the above findings indicates diffuse cerebral  dysfunction that is non-specific in etiology and can be seen with hypoxic/ischemic injury, toxic/metabolic encephalopathies, neurodegenerative disorders, or medication effect.  The absence of epileptiform discharges does not rule out a clinical diagnosis of epilepsy.  Clinical correlation is advised.   Patrcia DollyKaren Aquino, M.D.

## 2016-05-10 NOTE — Progress Notes (Signed)
PT Cancellation Note  Patient Details Name: Michael BookmanJohn Dorsey MRN: [id number] DOB: May 17, 1945   Cancelled Treatment:    Reason Eval/Treat Not Completed: Fatigue/lethargy limiting ability to participate. Spoke with OT who reports that pt was total assist for bed mobility and was unable to transfer this afternoon. Will retry evaluation with additional assistance when pt is more able to participate.    Colin BroachSabra M. Ameenah Prosser PT, DPT  [phone]  05/10/2016, 4:07 PM

## 2016-05-10 NOTE — Progress Notes (Signed)
Routine EEG completed, results pending. 

## 2016-05-11 DIAGNOSIS — I5032 Chronic diastolic (congestive) heart failure: Secondary | ICD-10-CM

## 2016-05-11 DIAGNOSIS — N186 End stage renal disease: Secondary | ICD-10-CM

## 2016-05-11 DIAGNOSIS — D638 Anemia in other chronic diseases classified elsewhere: Secondary | ICD-10-CM

## 2016-05-11 DIAGNOSIS — R7309 Other abnormal glucose: Secondary | ICD-10-CM

## 2016-05-11 DIAGNOSIS — E1122 Type 2 diabetes mellitus with diabetic chronic kidney disease: Secondary | ICD-10-CM

## 2016-05-11 DIAGNOSIS — E785 Hyperlipidemia, unspecified: Secondary | ICD-10-CM

## 2016-05-11 DIAGNOSIS — Z992 Dependence on renal dialysis: Secondary | ICD-10-CM

## 2016-05-11 DIAGNOSIS — I129 Hypertensive chronic kidney disease with stage 1 through stage 4 chronic kidney disease, or unspecified chronic kidney disease: Secondary | ICD-10-CM

## 2016-05-11 DIAGNOSIS — Z794 Long term (current) use of insulin: Secondary | ICD-10-CM

## 2016-05-11 LAB — GLUCOSE, CAPILLARY
GLUCOSE-CAPILLARY: 297 mg/dL — AB (ref 65–99)
Glucose-Capillary: 192 mg/dL — ABNORMAL HIGH (ref 65–99)
Glucose-Capillary: 232 mg/dL — ABNORMAL HIGH (ref 65–99)

## 2016-05-11 LAB — RENAL FUNCTION PANEL
Albumin: 3.2 g/dL — ABNORMAL LOW (ref 3.5–5.0)
Anion gap: 14 (ref 5–15)
BUN: 50 mg/dL — ABNORMAL HIGH (ref 6–20)
CALCIUM: 9.6 mg/dL (ref 8.9–10.3)
CHLORIDE: 99 mmol/L — AB (ref 101–111)
CO2: 22 mmol/L (ref 22–32)
Creatinine, Ser: 7.23 mg/dL — ABNORMAL HIGH (ref 0.61–1.24)
GFR calc non Af Amer: 7 mL/min — ABNORMAL LOW (ref >60)
GFR, EST AFRICAN AMERICAN: 8 mL/min — AB (ref >60)
GLUCOSE: 182 mg/dL — AB (ref 65–99)
Phosphorus: 5.4 mg/dL — ABNORMAL HIGH (ref 2.5–4.6)
Potassium: 4 mmol/L (ref 3.5–5.1)
Sodium: 135 mmol/L (ref 135–145)

## 2016-05-11 LAB — T3, FREE: T3, Free: 1 pg/mL — ABNORMAL LOW (ref 2.0–4.4)

## 2016-05-11 LAB — CBC
HEMATOCRIT: 33.8 % — AB (ref 39.0–52.0)
HEMOGLOBIN: 11.2 g/dL — AB (ref 13.0–17.0)
MCH: 29.6 pg (ref 26.0–34.0)
MCHC: 33.1 g/dL (ref 30.0–36.0)
MCV: 89.4 fL (ref 78.0–100.0)
Platelets: 196 10*3/uL (ref 150–400)
RBC: 3.78 MIL/uL — AB (ref 4.22–5.81)
RDW: 14.5 % (ref 11.5–15.5)
WBC: 5.9 10*3/uL (ref 4.0–10.5)

## 2016-05-11 LAB — HEPATITIS B SURFACE ANTIGEN: Hepatitis B Surface Ag: NEGATIVE

## 2016-05-11 MED ORDER — CARVEDILOL 25 MG PO TABS
25.0000 mg | ORAL_TABLET | Freq: Two times a day (BID) | ORAL | Status: DC
Start: 2016-05-11 — End: 2016-05-11

## 2016-05-11 MED ORDER — ISOSORBIDE MONONITRATE ER 60 MG PO TB24: 90.0000 mg | ORAL_TABLET | Freq: Every day | ORAL | Status: DC

## 2016-05-11 MED ORDER — HYDRALAZINE HCL 50 MG PO TABS
50.0000 mg | ORAL_TABLET | Freq: Three times a day (TID) | ORAL | Status: DC
  Administered 2016-05-11 – 2016-05-13 (×5): 50 mg via ORAL
  Filled 2016-05-11 (×6): qty 1

## 2016-05-11 NOTE — Progress Notes (Signed)
[facility] Progress Note   Subjective: Seen on HD. More alert today; Answers questions but seems to have trouble finding words   Vitals:   05/11/16 1145 05/11/16 1215 05/11/16 1245 05/11/16 1315  BP: (!) 171/91 (!) 175/89 (!) 178/102 (!) 178/95  Pulse: 76 73 73 73  Resp: 18 20 17 16   Temp:      TempSrc:      SpO2:      Weight:      Height:        Inpatient medications: . aspirin  81 mg Oral Daily  . aspirin  300 mg Rectal Once  . calcium acetate  667 mg Oral TID WC  . cloNIDine  0.2 mg Transdermal Weekly  . doxercalciferol  6 mcg Intravenous Q T,Th,Sa-HD  . heparin  5,000 Units Subcutaneous Q8H  . hydrALAZINE  50 mg Oral Q8H  . insulin aspart  0-9 Units Subcutaneous TID WC  . multivitamin  1 tablet Oral QHS  . thiamine injection  250 mg Intravenous TID   . sodium chloride 50 mL/hr at 05/10/16 1532   sodium chloride, sodium chloride, acetaminophen **OR** acetaminophen (TYLENOL) oral liquid 160 mg/5 mL **OR** acetaminophen, hydrALAZINE  Exam: More alert; responding to questions No jvd Chest clear bilat RRR 2/6 murmur Abd soft ntnd Ext no LE edema LUA AVF+ bruit Neuro - answering questions disoriented to place/time   Dialysis: MWF AF  4h  2/2 bath  P2  LUA AVF   76kg   Hep 7100 Venofer 50/wk Mircera 50 q 2, last 2.7 Hect 6 ug      Assessment: 1. AMS/ aphasia - unclear cause, neuro is working up 2. ESRD HD mwf 3. Volume is well below dry wt - will start maint IVF's 4. HTN - BP's high, on Catapres patch/hydrazine for now (not reliable to swallow pills) / will add home meds when cleared to swallow  5. 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  Vinson Moselleob Mivaan Corbitt MD [facility] pager [phone]   05/11/2016, 2:30  PM     Recent Labs Lab 05/09/16 1307 05/10/16 0213 05/11/16 0656  NA 136 137 135  K 3.9 3.7 4.0  CL 95* 98* 99*  CO2 27 27 22   GLUCOSE 146* 81 182*  BUN 22* 31* 50*  CREATININE 3.68* 5.21* 7.23*  CALCIUM 9.2 9.2 9.6  PHOS 3.2 5.0* 5.4*    Recent Labs Lab 05/08/16 1622 05/09/16 1307 05/10/16 0213 05/11/16 0656  AST 13*  --   --   --   ALT 15*  --   --   --   ALKPHOS 52  --   --   --   BILITOT 0.8  --   --   --   PROT 6.3*  --   --   --   ALBUMIN 3.2* 3.6 3.2* 3.2*    Recent Labs Lab 05/08/16 1622  05/09/16 1307 05/10/16 0213 05/11/16 0655  WBC 6.3  --  6.7 6.6 5.9  NEUTROABS 5.0  --   --   --   --   HGB 9.9*  < > 11.9* 11.2* 11.2*  HCT 28.8*  < > 35.2* 34.1* 33.8*  MCV 86.2  --  87.6 88.6 89.4  PLT 179  --  203 209 196  < > = values in this interval not displayed. Iron/TIBC/Ferritin/ %Sat No results found for: IRON, TIBC, FERRITIN, IRONPCTSAT

## 2016-05-11 NOTE — Evaluation (Addendum)
Physical Therapy Evaluation Patient Details Name: Michael Dorsey MRN: [id number] DOB: 1946/02/02 Today's Date: 05/11/2016   History of Present Illness  71 year old man with a history of end-stage renal disease requiring hemodialysis, diabetes, hypertension, and hyperlipidemia who presents with a five-day history of altered mental status. MRI on 2/9 showed no acute intracranial abnormality.   Clinical Impression  Patient presents with decreased balance and safety awareness with cognitive issues.  Feel he will benefit from skilled PT in the acute setting to allow return home with 24 hour family support and follow up HHPT.  Feel his mobility is much is improved and hope it continues as cognition improved as well.  If family unable to provide 24 hour assist, may need SNF.     Follow Up Recommendations Home health PT    Equipment Recommendations  None recommended by PT    Recommendations for Other Services       Precautions / Restrictions Precautions Precautions: Fall      Mobility  Bed Mobility Overal bed mobility: Needs Assistance Bed Mobility: Supine to Sit;Sit to Supine     Supine to sit: Supervision;HOB elevated Sit to supine: Supervision   General bed mobility comments: increased time and multimodal cues needed to participate  Transfers Overall transfer level: Needs assistance Equipment used: None Transfers: Sit to/from Stand Sit to Stand: Min assist         General transfer comment: assist for anterior weight shift  Ambulation/Gait Ambulation/Gait assistance: Min assist Ambulation Distance (Feet): 120 Feet Assistive device: 1 person hand held assist;Straight cane Gait Pattern/deviations: Step-through pattern;Decreased stride length     General Gait Details: imbalance noted with cane, but assist on other (L) arm to prevent LOB; wandering and noting items in closet in room, into bathroom, but once there denied he needed to go, assist to find room   Stairs            Wheelchair Mobility    Modified Rankin (Stroke Patients Only)       Balance Overall balance assessment: Needs assistance Sitting-balance support: No upper extremity supported;Feet supported Sitting balance-Leahy Scale: Good       Standing balance-Leahy Scale: Poor Standing balance comment: min A for balance initially                             Pertinent Vitals/Pain Faces Pain Scale: No hurt    Home Living Family/patient expects to be discharged to:: Private residence                 Additional Comments: Pt unable to provide home set up and no family present. Per RN; pt from home with family.    Prior Function           Comments: Per RN; pt independent PTA. 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     Hand Dominance        Extremity/Trunk Assessment   Upper Extremity Assessment Upper Extremity Assessment: Defer to OT evaluation    Lower Extremity Assessment Lower Extremity Assessment: Overall WFL for tasks assessed       Communication   Communication: Receptive difficulties;Expressive difficulties;Prefers language other than English (English is second language and previously able to communicate, but not fluent)  Cognition Arousal/Alertness: Awake/alert Behavior During Therapy: WFL for tasks assessed/performed Overall Cognitive Status: Impaired/Different from baseline Area of Impairment: Orientation;Following commands Orientation Level: Disoriented to;Place;Time;Situation     Following Commands: Follows one step commands with  increased time     Problem Solving: Slow processing;Decreased initiation;Requires verbal cues;Requires tactile cues General Comments: increased time and multimodal cues for participation in mobility    General Comments      Exercises     Assessment/Plan    PT Assessment Patient needs continued PT services  PT Problem List Decreased balance;Decreased cognition;Decreased  safety awareness;Decreased mobility          PT Treatment Interventions DME instruction;Therapeutic activities;Cognitive remediation;Gait training;Therapeutic exercise;Patient/family education;Stair training;Balance training;Functional mobility training    PT Goals (Current goals can be found in the Care Plan section)  Acute Rehab PT Goals Patient Stated Goal: pt unable to state PT Goal Formulation: Patient unable to participate in goal setting Time For Goal Achievement: 05/18/16 Potential to Achieve Goals: Good    Frequency Min 3X/week   Barriers to discharge        Co-evaluation               End of Session Equipment Utilized During Treatment: Gait belt Activity Tolerance: Patient tolerated treatment well Patient left: in bed;with call bell/phone within reach;with bed alarm set           Time: 1633-1650 PT Time Calculation (min) (ACUTE ONLY): 17 min   Charges:   PT Evaluation $PT Eval Moderate Complexity: 1 Procedure     PT G CodesElray Mcgregor:        [name] 05/11/2016, 5:49 PM  [name], PT [phone] 05/11/2016

## 2016-05-11 NOTE — Progress Notes (Signed)
Subjective:  [name] is much more interactive this morning when seen in dialysis. He is speaking in full sentences although appears to have some word finding difficulty. He can tell me his full name and that he is in a hospital, but when asked what city or state he says "I don't know what to say." He is unable to tell me what year it is. He says he does not remember the last few days and thinks he is having some trouble finding his words. He otherwise feels fine and denies any pain.   Objective:  Vital signs in last 24 hours: Vitals:   05/11/16 0700 05/11/16 0933 05/11/16 0945 05/11/16 1015  BP:  (!) 161/93 (!) 159/78 (!) 168/90  Pulse:  81 78 76  Resp:  20 20 15   Temp:  98.3 F (36.8 C)    TempSrc:  Oral    SpO2:  99%    Weight: 162 lb 4.1 oz (73.6 kg) 161 lb 9.6 oz (73.3 kg)    Height:       General: resting in bed during dialysis Neck: No appreciable thyromegaly or palpable nodules Cardiac: RRR, flow murmur present Pulm: clear to auscultation anteriorly Ext: Receiving dialysis via LUE AVF Neuro: alert and oriented to person (can tell me he is in the hospital on prompting, does not know the year). Much more communicative, speaks in full sentences although appears to have some word finding difficulty. Exam limited while in HD.   Assessment/Plan:  Principal Problem:   Altered mental state Active Problems:   Type 2 diabetes mellitus (HCC)   Hypertension, renal disease   ESRD on hemodialysis (HCC)   [name] a 71 y.o.malewith HTN, DM, ESRD on HD, who presents w/ 5 day h/o altered mental status concerning for stroke versus uremia in the setting of missed HD. For more potential etiologies include in the differential diagnosis please see below.  AMS The patient's mental status is significantly improved this morning however he does remain altered to time and appears to have difficulties with word finding. The etiology of his altered mental status  remains unclear. His thyroid hormone panel was conflicting (low TSH, elevated free T4, low free T3). Thyroid uptake scan is not yet completed. Neurology has ordered thyroid antibodies. EEG was non-specific with changes that can be seen with hypoxic/ischemic injury, toxic/metabolic encephalopathies, neurodegenerative disorders, or medication effect. MRI did not show evidence of acute infarction but did show changes suggestive of vascular dementia. We will follow up on the thyroid studies as apathetic hyperthyroidism and Hashimoto's encephalopathy are in the differential. As MRI results suggest that acute CVA is less likely, we will work towards more aggressive blood pressure control as hypertensive encephalopathy may also explain his symptoms. - Telemetry - f/u Vitamin B1 results; Complete thiamine IV 250 mg 3 times a day - Continue aspirin - Repeat TSH low (0.120), free T4 elevated (1.23), free T3 low (1.0) - f/u Thyroid antibodies - F/U blood cultures- currently no growth to date   IDDM: - A1c 12.3 (05/08/16) - SSI-S  Malignant HTN: Uncontrolled. BP 190/122 on admission. His blood pressure remains elevated and his most recent blood pressure was 168/90. He will continue with scheduled HD per nephrology. Will start adding back home medications if able to tolerate oral meds as acute CVA is less likely given MRI results . - HD per Nephrology - Clonidine patch if unable to tolerate orals - Resume home Hydralazine 50 mg po TID - Can add back home  Carvedilol 25 mg po BID and home Imdur 90 mg daily as BPs tolerate - Hydralazine PRN BP > 185/110. - Continue to monitor  ESRD on MWF HD: - HD this morning off schedule - Continue HD recommendations per nephrology  DVT/PE prophylaxis: Heparin FEN/GI: Carb modified  Dispo: Anticipated discharge pending clinical course.  Darreld Mclean, MD 05/11/2016, 11:45 AM Pager: [phone]

## 2016-05-11 NOTE — Progress Notes (Signed)
PT Cancellation Note  Patient Details Name: Michael Dorsey MRN: [id number] DOB: 10/12/1945   Cancelled Treatment:    Reason Eval/Treat Not Completed: Patient at procedure or test/unavailable; currently in dialysis.  Will attempt another day.   Elray McgregorCynthia Wynn 05/11/2016, 3:09 PM  [name], PT [phone] 05/11/2016

## 2016-05-11 NOTE — Consult Note (Addendum)
Physical Medicine and Rehabilitation Consult Reason for Consult: Impaired cognition, communication, mobility Referring Physician: Larrie Kass, MD   HPI: Michael Dorsey is a 71 y.o. male with past medical history of CHF, diabetes mellitus, ESRD, HLD, HTN who presented on 05/08/16 with AMS 5 days. History taken from chart review. In the ED his blood pressure was noted to be 200/100. Head CT was performed, reviewed, which showed old ischemia. Nephrology was consulted and did not feel this was secondary to patient being under dialyzed. Neurology was consulted and workup initiated. MRI did not show any acute infarct but did show severe age-related changes. Blood cultures are no growth to date. His BP remains elevated. He has associated difficulties with mobility and cognition.   Review of Systems  Unable to perform ROS: Mental acuity   Past Medical History:  Diagnosis Date  . CHF (congestive heart failure) ([place])   . Diabetes mellitus without complication ([place])   . Diastolic dysfunction    heart failure with preserved LV function  . Hypercholesteremia   . Hypertension   . Renal disorder    in process of being diagnosed   Past Surgical History:  Procedure Laterality Date  . AV FISTULA PLACEMENT Left 11/10/2013   Procedure: LEFT ARM  ARTERIOVENOUS (AV) FISTULA CREATION;  Surgeon: Rosetta Posner, MD;  Location: [place];  Service: Vascular;  Laterality: Left;  . FISTULA SUPERFICIALIZATION Left 12/25/2015   Procedure: LEFT FISTULA PLICATION;  Surgeon: Conrad Tensed, MD;  Location: [place];  Service: Vascular;  Laterality: Left;  . NEPHRECTOMY     [facility]   No family history on file. Social History:  reports that he has never smoked. He has never used smokeless tobacco. He reports that he drinks alcohol. He reports that he does not use drugs. Allergies: No Known Allergies Medications Prior to Admission  Medication Sig Dispense Refill  . acetaminophen (TYLENOL) 500 MG tablet Take 500 mg by  mouth every 6 (six) hours as needed for moderate pain or headache.    Marland Kitchen aspirin 81 MG tablet Take 81 mg by mouth daily.    . calcium acetate (PHOSLO) 667 MG capsule TAKE 1 CAPSULE BY MOUTH 2 TIMES DAILY. 60 capsule 0  . carvedilol (COREG) 25 MG tablet Take 25 mg by mouth 2 (two) times daily.  3  . furosemide (LASIX) 80 MG tablet TAKE 1 TABLET BY MOUTH 3 TIMES DAILY. 90 tablet 3  . hydrALAZINE (APRESOLINE) 50 MG tablet TAKE 1 TABLET BY MOUTH EVERY 8 HOURS. (Patient taking differently: Take 50 mg by mouth every 8 (eight) hours. ) 90 tablet 3  . isosorbide mononitrate (IMDUR) 60 MG 24 hr tablet TAKE 1 & 1/2 TABLETS BY MOUTH DAILY. 45 tablet 0  . NOVOLIN 70/30 (70-30) 100 UNIT/ML injection INJECT 20 UNITS INTO THE SKIN 2 TIMES DAILY WITH A MEAL. 10 mL 0  . acetaminophen-codeine (TYLENOL #3) 300-30 MG per tablet Take 1 tablet by mouth every 12 (twelve) hours as needed for moderate pain. (Patient not taking: Reported on 05/10/2016) 30 tablet 0  . atorvastatin (LIPITOR) 40 MG tablet Take 1 tablet (40 mg total) by mouth daily. (Patient not taking: Reported on 05/10/2016) 30 tablet 3  . Blood Glucose Monitoring Suppl (TRUE METRIX METER) w/Device KIT USE AS INSTRUCTED (Patient not taking: Reported on 05/10/2016) 1 kit 0  . calcitRIOL (ROCALTROL) 0.25 MCG capsule Take 1 capsule (0.25 mcg total) by mouth daily. (Patient not taking: Reported on 05/10/2016) 30 capsule 3  .  oxyCODONE-acetaminophen (PERCOCET/ROXICET) 5-325 MG tablet Take 1 tablet by mouth every 6 (six) hours as needed. (Patient not taking: Reported on 05/10/2016) 10 tablet 0  . polyethylene glycol (MIRALAX / GLYCOLAX) packet Take 17 g by mouth 2 (two) times daily. (Patient taking differently: Take 17 g by mouth daily as needed. ) 14 each 0  . TRUEPLUS LANCETS 28G MISC USE AS INSTRUCTED (Patient not taking: Reported on 05/10/2016) 100 each 5    Home: Home Living Family/patient expects to be discharged to:: Unsure Additional Comments: Pt unable to provide  home set up and no family present. Per RN; pt from home with family.  Lives With: Family  Functional History: Prior Function Comments: Per RN; pt independent PTA. 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 Functional Status:  Mobility: Bed Mobility Overal bed mobility: Needs Assistance Bed Mobility: Supine to Sit, Sit to Supine Supine to sit: Total assist Sit to supine: Total assist General bed mobility comments: Total assist for bed mobility due to decreased ability to follow commands. Transfers General transfer comment: Not assessed this session      ADL: ADL Overall ADL's : Needs assistance/impaired Grooming: Set up, Supervision/safety, Bed level, Cueing for sequencing, Wash/dry face Grooming Details (indicate cue type and reason): Max multimodal cues for initiation of task General ADL Comments: Overall total assist for ADL due to decreased cognition and difficulty following commands.  Cognition: Cognition Overall Cognitive Status: Impaired/Different from baseline Arousal/Alertness: Lethargic Orientation Level: Oriented to person, Disoriented to place, Disoriented to time, Disoriented to situation Attention: Sustained Sustained Attention: Impaired Sustained Attention Impairment: Verbal basic, Functional basic Awareness: Impaired Awareness Impairment: Emergent impairment Problem Solving: Impaired Problem Solving Impairment: Functional basic Safety/Judgment: Impaired Cognition Arousal/Alertness: Awake/alert, Lethargic (initially lethargic, then alert once sitting EOB) Behavior During Therapy: Flat affect Overall Cognitive Status: Impaired/Different from baseline Area of Impairment: Orientation, Following commands, Problem solving Orientation Level: Disoriented to, Place, Time, Situation Following Commands: Follows one step commands inconsistently, Follows one step commands with increased time Problem Solving: Slow processing, Decreased initiation, Difficulty  sequencing, Requires verbal cues, Requires tactile cues General Comments: Pt able to verbalize name and "no" when asked if he was in pain. Able to wash face with washcloth with cues and increased time. Unable to give thumbs up or perform AROM to command.  Blood pressure (!) 175/89, pulse 73, temperature 98.3 F (36.8 C), temperature source Oral, resp. rate 20, height 5' 10"  (1.778 m), weight 73.3 kg (161 lb 9.6 oz), SpO2 99 %. Physical Exam  Vitals reviewed. Constitutional: He appears well-developed and well-nourished.  HENT:  Head: Normocephalic and atraumatic.  Eyes: EOM are normal. Scleral icterus is present.  Neck: Normal range of motion. Neck supple.  Cardiovascular: Normal rate and regular rhythm.   Respiratory: Effort normal and breath sounds normal.  GI: Soft. Bowel sounds are normal.  Musculoskeletal: He exhibits no edema or tenderness.  Neurological: He is alert.  Exam limited due to patient participation, not following commands, but moves all 4 extremities spontaneously DTRs symmetric  Skin: Skin is warm and dry.  Psychiatric: His affect is blunt. His speech is delayed and tangential. He is slowed. Cognition and memory are impaired.  Confused, perseverative He is inattentive.    Results for orders placed or performed during the hospital encounter of 05/08/16 (from the past 24 hour(s))  TSH     Status: Abnormal   Collection Time: 05/10/16  2:09 PM  Result Value Ref Range   TSH 0.120 (L) 0.350 - 4.500 uIU/mL  T4, free     Status: Abnormal   Collection Time: 05/10/16  2:09 PM  Result Value Ref Range   Free T4 1.23 (H) 0.61 - 1.12 ng/dL  T3, free     Status: Abnormal   Collection Time: 05/10/16  2:09 PM  Result Value Ref Range   T3, Free 1.0 (L) 2.0 - 4.4 pg/mL  Glucose, capillary     Status: Abnormal   Collection Time: 05/10/16  5:06 PM  Result Value Ref Range   Glucose-Capillary 186 (H) 65 - 99 mg/dL  Glucose, capillary     Status: Abnormal   Collection Time:  05/10/16  9:18 PM  Result Value Ref Range   Glucose-Capillary 213 (H) 65 - 99 mg/dL   Comment 1 Notify RN    Comment 2 Document in Chart   Glucose, capillary     Status: Abnormal   Collection Time: 05/11/16  6:04 AM  Result Value Ref Range   Glucose-Capillary 192 (H) 65 - 99 mg/dL   Comment 1 Notify RN    Comment 2 Document in Chart   CBC     Status: Abnormal   Collection Time: 05/11/16  6:55 AM  Result Value Ref Range   WBC 5.9 4.0 - 10.5 K/uL   RBC 3.78 (L) 4.22 - 5.81 MIL/uL   Hemoglobin 11.2 (L) 13.0 - 17.0 g/dL   HCT 33.8 (L) 39.0 - 52.0 %   MCV 89.4 78.0 - 100.0 fL   MCH 29.6 26.0 - 34.0 pg   MCHC 33.1 30.0 - 36.0 g/dL   RDW 14.5 11.5 - 15.5 %   Platelets 196 150 - 400 K/uL  Renal function panel     Status: Abnormal   Collection Time: 05/11/16  6:56 AM  Result Value Ref Range   Sodium 135 135 - 145 mmol/L   Potassium 4.0 3.5 - 5.1 mmol/L   Chloride 99 (L) 101 - 111 mmol/L   CO2 22 22 - 32 mmol/L   Glucose, Bld 182 (H) 65 - 99 mg/dL   BUN 50 (H) 6 - 20 mg/dL   Creatinine, Ser 7.23 (H) 0.61 - 1.24 mg/dL   Calcium 9.6 8.9 - 10.3 mg/dL   Phosphorus 5.4 (H) 2.5 - 4.6 mg/dL   Albumin 3.2 (L) 3.5 - 5.0 g/dL   GFR calc non Af Amer 7 (L) >60 mL/min   GFR calc Af Amer 8 (L) >60 mL/min   Anion gap 14 5 - 15   Mr Brain Wo Contrast  Result Date: 05/10/2016 CLINICAL DATA:  Altered mental status.  Abnormal CT scan. EXAM: MRI HEAD WITHOUT CONTRAST TECHNIQUE: Multiplanar, multiecho pulse sequences of the brain and surrounding structures were obtained without intravenous contrast. COMPARISON:  CT of the head 05/08/2016. FINDINGS: Brain: The diffusion-weighted images demonstrate no acute or subacute infarction. Stents white matter changes correlate with the CT scan. Asymmetric ischemic changes are present within the left basal ganglia. Dilated perivascular spaces are present throughout the basal ganglia and thalami. The internal auditory canals are normal bilaterally. The brainstem and  cerebellum are normal. Vascular: Flow is present in the major intracranial arteries. Skull and upper cervical spine: The skullbase is normal. The craniocervical junction is within normal limits. Marrow signal is normal. Midline sagittal structures are normal. Sinuses/Orbits: Mucosal thickening is present along the floor of the maxillary sinuses bilaterally. The remaining paranasal sinuses and the mastoid air cells are clear. IMPRESSION: 1. No acute intracranial abnormality. 2. Severe age advanced atrophy and diffuse white matter disease  reflecting the sequela of chronic microvascular ischemia. 3. Asymmetric ischemic changes in the left basal ganglia compared to the right. This accounts for the CT findings. 4. Maxillary sinus disease as described. Electronically Signed   By: San Morelle M.D.   On: 05/10/2016 12:05   Dg Swallowing Func-speech Pathology  Result Date: 05/10/2016 Objective Swallowing Evaluation: Type of Study: MBS-Modified Barium Swallow Study Patient Details Name: Gee Habig MRN: [id number] Date of Birth: 20-Sep-1945 Today's Date: 05/10/2016 Time: SLP Start Time (ACUTE ONLY): 0959-SLP Stop Time (ACUTE ONLY): 1013 SLP Time Calculation (min) (ACUTE ONLY): 14 min Past Medical History: Past Medical History: Diagnosis Date . CHF (congestive heart failure) ([place])  . Diabetes mellitus without complication ([place])  . Diastolic dysfunction   heart failure with preserved LV function . Hypercholesteremia  . Hypertension  . Renal disorder   in process of being diagnosed Past Surgical History: Past Surgical History: Procedure Laterality Date . AV FISTULA PLACEMENT Left 11/10/2013  Procedure: LEFT ARM  ARTERIOVENOUS (AV) FISTULA CREATION;  Surgeon: Rosetta Posner, MD;  Location: [place];  Service: Vascular;  Laterality: Left; . FISTULA SUPERFICIALIZATION Left 12/25/2015  Procedure: LEFT FISTULA PLICATION;  Surgeon: Conrad Lemont, MD;  Location: [place];  Service: Vascular;  Laterality: Left; . NEPHRECTOMY    [facility]  HPI: Pt is a 71 y.o. male with a PMH of end-stage renal disease requiring hemodialysis, diabetes, hypertension, and hyperlipidemia who presented with a five-day history of altered mental status. He was performing all of his activities of daily living at baseline. After his dialysis session on Friday he began refusing medications and then refused to attend dialysis on Monday. He has not been eating well. CT of head showed age indeterminate LEFT basal ganglia lacunar infarct, old appearing LEFT thalamus lacunar infarct. Moderate to severe global brain atrophy and moderate to severe chronic small vessel ischemic disease. CXR was unremarkable. No Data Recorded Assessment / Plan / Recommendation CHL IP CLINICAL IMPRESSIONS 05/10/2016 Therapy Diagnosis Mild oral phase dysphagia;Mild pharyngeal phase dysphagia Clinical Impression [name] was seen for a MBS due to concerns during bedside swallow eval. His alertness, language production, and language comprehension has improved significantly compared to the previous day. He exhibits a very mild oropharyngeal dysphagia and did not exhibit penetration/aspiration during tested consistencies (thin liquids via cup/straw, regular solids, barium pill). Delayed swallow initiation to the pyriform sinuses and valleculae was noted with thin liquids via cup/straw. Prolonged mastication, prolonged oral transit, and vallecular residue during trials of regular solids, however mild and functional. Trials of the barium pill with thin liquids were completed which did not result in penetration/aspiration. Educated pt re: results of the MBS, his swallowing function, and diet recommendations. Recommend diet of regular solids, thin liquids (straws ok), whole meds with liquids. ST not needed for dysphagia treatment at this time. Impact on safety and function Mild aspiration risk   CHL IP TREATMENT RECOMMENDATION 05/10/2016 Treatment Recommendations No treatment recommended at this time    Prognosis 05/10/2016 Prognosis for Safe Diet Advancement Good Barriers to Reach Goals Cognitive deficits Barriers/Prognosis Comment -- CHL IP DIET RECOMMENDATION 05/10/2016 SLP Diet Recommendations Regular solids;Thin liquid Liquid Administration via Cup;Straw Medication Administration Whole meds with liquid Compensations Minimize environmental distractions;Slow rate;Small sips/bites Postural Changes Seated upright at 90 degrees   CHL IP OTHER RECOMMENDATIONS 05/10/2016 Recommended Consults -- Oral Care Recommendations Oral care BID Other Recommendations --   CHL IP FOLLOW UP RECOMMENDATIONS 05/10/2016 Follow up Recommendations Other (comment)   CHL IP FREQUENCY AND DURATION 05/09/2016  Speech Therapy Frequency (ACUTE ONLY) min 2x/week Treatment Duration 2 weeks      CHL IP ORAL PHASE 05/10/2016 Oral Phase Impaired Oral - Pudding Teaspoon -- Oral - Pudding Cup -- Oral - Honey Teaspoon -- Oral - Honey Cup -- Oral - Nectar Teaspoon -- Oral - Nectar Cup -- Oral - Nectar Straw -- Oral - Thin Teaspoon -- Oral - Thin Cup -- Oral - Thin Straw -- Oral - Puree -- Oral - Mech Soft -- Oral - Regular Impaired mastication;Delayed oral transit Oral - Multi-Consistency -- Oral - Pill -- Oral Phase - Comment --  CHL IP PHARYNGEAL PHASE 05/10/2016 Pharyngeal Phase Impaired Pharyngeal- Pudding Teaspoon -- Pharyngeal -- Pharyngeal- Pudding Cup -- Pharyngeal -- Pharyngeal- Honey Teaspoon -- Pharyngeal -- Pharyngeal- Honey Cup -- Pharyngeal -- Pharyngeal- Nectar Teaspoon -- Pharyngeal -- Pharyngeal- Nectar Cup -- Pharyngeal -- Pharyngeal- Nectar Straw -- Pharyngeal -- Pharyngeal- Thin Teaspoon -- Pharyngeal -- Pharyngeal- Thin Cup Delayed swallow initiation-pyriform sinuses;Delayed swallow initiation-vallecula Pharyngeal -- Pharyngeal- Thin Straw Delayed swallow initiation-vallecula;Delayed swallow initiation-pyriform sinuses Pharyngeal -- Pharyngeal- Puree -- Pharyngeal -- Pharyngeal- Mechanical Soft -- Pharyngeal -- Pharyngeal- Regular Pharyngeal  residue - valleculae Pharyngeal -- Pharyngeal- Multi-consistency -- Pharyngeal -- Pharyngeal- Pill -- Pharyngeal -- Pharyngeal Comment --  CHL IP CERVICAL ESOPHAGEAL PHASE 05/10/2016 Cervical Esophageal Phase WFL Pudding Teaspoon -- Pudding Cup -- Honey Teaspoon -- Honey Cup -- Nectar Teaspoon -- Nectar Cup -- Nectar Straw -- Thin Teaspoon -- Thin Cup -- Thin Straw -- Puree -- Mechanical Soft -- Regular -- Multi-consistency -- Pill -- Cervical Esophageal Comment -- No flowsheet data found. Houston Siren 05/10/2016, 1:30 PM Orbie Pyo Colvin Caroli.Ed CCC-SLP Pager [phone]               Assessment/Plan: Diagnosis: AMS, workup ongoing Labs and images independently reviewed.  Records reviewed and summated above.  1. Does the need for close, 24 hr/day medical supervision in concert with the patient's rehab needs make it unreasonable for this patient to be served in a less intensive setting? Potentially  2. Co-Morbidities requiring supervision/potential complications: CHF (Monitor in accordance with increased physical activity and avoid UE resistance excercises), diabetes mellitus, labile at presents (Monitor in accordance with exercise and adjust meds as necessary), ESRD (recs per nephro), HLD (cont meds), HTN (monitor and provide prns in accordance with increased physical exertion and pain), thyroid antibodies (workup ongoing), anemia of chronic disease (transfuse if necessary to ensure appropriate perfusion for increased activity tolerance) 3. Due to safety, skin/wound care, disease management, medication administration and patient education, does the patient require 24 hr/day rehab nursing? Yes 4. Does the patient require coordinated care of a physician, rehab nurse, PT (1-2 hrs/day, 5 days/week), OT (1-2 hrs/day, 5 days/week) and SLP (1-2 hrs/day, 5 days/week) to address physical and functional deficits in the context of the above medical diagnosis(es)? Yes Addressing deficits in the following areas:  balance, endurance, locomotion, strength, transferring, bathing, dressing, toileting, cognition and psychosocial support 5. Can the patient actively participate in an intensive therapy program of at least 3 hrs of therapy per day at least 5 days per week? No 6. The potential for patient to make measurable gains while on inpatient rehab is excellent 7. Anticipated functional outcomes upon discharge from inpatient rehab are TBD  with PT, supervision with OT, supervision with SLP. 8. Estimated rehab length of stay to reach the above functional goals is: TBD 9. Does the patient have adequate social supports and living environment to accommodate these discharge functional  goals? Potentially 10. Anticipated D/C setting: Home 11. Anticipated post D/C treatments: HH therapy and Home excercise program 12. Overall Rehab/Functional Prognosis: good  RECOMMENDATIONS: This patient's condition is appropriate for continued rehabilitative care in the following setting: Pt limitations appear to be primarily cognitive.  Once pt has etiology fo AMS, he will likely make functional gains quickly.  Further, at this time, pt unable to tolerate PT and requiring total assist for OT.  Workup is also ongoing.  Anticipate pt will be able to return home once mental status improves, however, given level of cerebral atrophy, this may take some time, depending on his baseline.  If family, unable to care for patient, would recommend SNF.  Patient has agreed to participate in recommended program. Potentially Note that insurance prior authorization may be required for reimbursement for recommended care.  Comment: Rehab Admissions Coordinator to follow up.  Delice Lesch, MD, Mellody Drown 05/11/2016

## 2016-05-11 NOTE — Progress Notes (Signed)
Subjective: Interval History:  No acute events overnight. I spoke to his daughter over the phone to get his neurological baseline. She reports he is normally oriented to place, person and time. He usually takes care of his ADLs independently. He was able to carryout his routine function as normal excepting was refusing Hd. He wasn't talking and not cooperating to go for Hd. He missed his dialysis on Monday. She reported that every time he comes back from dialysis he just knocks out and doesn't feel well. He speak Guin[place] very well.  Objective: Vital signs in last 24 hours: Temp:  [98.1 F (36.7 C)-98.7 F (37.1 C)] 98.1 F (36.7 C) (02/10 0520) Pulse Rate:  [75-87] 78 (02/10 0520) Resp:  [17-20] 20 (02/10 0520) BP: (138-195)/(77-104) 171/104 (02/10 0520) SpO2:  [99 %-100 %] 100 % (02/10 0520) Weight:  [73.6 kg (162 lb 4.1 oz)] 73.6 kg (162 lb 4.1 oz) (02/10 0700)  Intake/Output from previous day: 02/09 0701 - 02/10 0700 In: 699.2 [P.O.:120; I.V.:579.2] Out: 250 [Urine:250] Intake/Output this shift: No intake/output data recorded. Nutritional status: Diet Carb Modified Fluid consistency: Thin; Room service appropriate? Yes  Neurologic Examination: Mental status: Awake and alert but oriented to self only. Able to pick its hospital from options. Does not know the name of the hospital. Speech and language: Following all commands. Naming intact, reptition intact Cranial nerves: Pupils approximately 2 mm and reactive to light. Extra muscles intact. Facial sensation symmetric. No facial droop is appreciated. Hearing intact. Uvula midline. Tongue midline. Motor: 5/5 throughout Sensory: Normal sensation to light touch Coordination: Normal finger to nose Gait: Deferred  Lab Results:  Recent Labs  05/10/16 0213 05/11/16 0655 05/11/16 0656  WBC 6.6 5.9  --   HGB 11.2* 11.2*  --   HCT 34.1* 33.8*  --   PLT 209 196  --   NA 137  --  135  K 3.7  --  4.0  CL 98*  --  99*  CO2 27  --   22  GLUCOSE 81  --  182*  BUN 31*  --  50*  CREATININE 5.21*  --  7.23*  CALCIUM 9.2  --  9.6   Lipid Panel  Recent Labs  05/09/16 0229  CHOL 200  TRIG 190*  HDL 36*  CHOLHDL 5.6  VLDL 38  LDLCALC 161*    Studies/Results: Mr Brain Wo Contrast  Result Date: 05/10/2016 CLINICAL DATA:  Altered mental status.  Abnormal CT scan. EXAM: MRI HEAD WITHOUT CONTRAST TECHNIQUE: Multiplanar, multiecho pulse sequences of the brain and surrounding structures were obtained without intravenous contrast. COMPARISON:  CT of the head 05/08/2016. FINDINGS: Brain: The diffusion-weighted images demonstrate no acute or subacute infarction. Stents white matter changes correlate with the CT scan. Asymmetric ischemic changes are present within the left basal ganglia. Dilated perivascular spaces are present throughout the basal ganglia and thalami. The internal auditory canals are normal bilaterally. The brainstem and cerebellum are normal. Vascular: Flow is present in the major intracranial arteries. Skull and upper cervical spine: The skullbase is normal. The craniocervical junction is within normal limits. Marrow signal is normal. Midline sagittal structures are normal. Sinuses/Orbits: Mucosal thickening is present along the floor of the maxillary sinuses bilaterally. The remaining paranasal sinuses and the mastoid air cells are clear. IMPRESSION: 1. No acute intracranial abnormality. 2. Severe age advanced atrophy and diffuse white matter disease reflecting the sequela of chronic microvascular ischemia. 3. Asymmetric ischemic changes in the left basal ganglia compared to the right.  This accounts for the CT findings. 4. Maxillary sinus disease as described. Electronically Signed   By: Marin Roberts M.D.   On: 05/10/2016 12:05   Dg Swallowing Func-speech Pathology  Result Date: 05/10/2016 Objective Swallowing Evaluation: Type of Study: MBS-Modified Barium Swallow Study Patient Details Name: Michael Dorsey  MRN: [id number] Date of Birth: 03-Oct-1945 Today's Date: 05/10/2016 Time: SLP Start Time (ACUTE ONLY): 0959-SLP Stop Time (ACUTE ONLY): 1013 SLP Time Calculation (min) (ACUTE ONLY): 14 min Past Medical History: Past Medical History: Diagnosis Date . CHF (congestive heart failure) (HCC)  . Diabetes mellitus without complication (HCC)  . Diastolic dysfunction   heart failure with preserved LV function . Hypercholesteremia  . Hypertension  . Renal disorder   in process of being diagnosed Past Surgical History: Past Surgical History: Procedure Laterality Date . AV FISTULA PLACEMENT Left 11/10/2013  Procedure: LEFT ARM  ARTERIOVENOUS (AV) FISTULA CREATION;  Surgeon: Larina Earthly, MD;  Location: [facility] OR;  Service: Vascular;  Laterality: Left; . FISTULA SUPERFICIALIZATION Left 12/25/2015  Procedure: LEFT FISTULA PLICATION;  Surgeon: Fransisco Hertz, MD;  Location: [facility] OR;  Service: Vascular;  Laterality: Left; . NEPHRECTOMY    [facility] HPI: Pt is a 71 y.o. male with a PMH of end-stage renal disease requiring hemodialysis, diabetes, hypertension, and hyperlipidemia who presented with a five-day history of altered mental status. He was performing all of his activities of daily living at baseline. After his dialysis session on Friday he began refusing medications and then refused to attend dialysis on Monday. He has not been eating well. CT of head showed age indeterminate LEFT basal ganglia lacunar infarct, old appearing LEFT thalamus lacunar infarct. Moderate to severe global brain atrophy and moderate to severe chronic small vessel ischemic disease. CXR was unremarkable. No Data Recorded Assessment / Plan / Recommendation CHL IP CLINICAL IMPRESSIONS 05/10/2016 Therapy Diagnosis Mild oral phase dysphagia;Mild pharyngeal phase dysphagia Clinical Impression Michael Dorsey was seen for a MBS due to concerns during bedside swallow eval. His alertness, language production, and language comprehension has improved significantly compared to the  previous day. He exhibits a very mild oropharyngeal dysphagia and did not exhibit penetration/aspiration during tested consistencies (thin liquids via cup/straw, regular solids, barium pill). Delayed swallow initiation to the pyriform sinuses and valleculae was noted with thin liquids via cup/straw. Prolonged mastication, prolonged oral transit, and vallecular residue during trials of regular solids, however mild and functional. Trials of the barium pill with thin liquids were completed which did not result in penetration/aspiration. Educated pt re: results of the MBS, his swallowing function, and diet recommendations. Recommend diet of regular solids, thin liquids (straws ok), whole meds with liquids. ST not needed for dysphagia treatment at this time. Impact on safety and function Mild aspiration risk   CHL IP TREATMENT RECOMMENDATION 05/10/2016 Treatment Recommendations No treatment recommended at this time   Prognosis 05/10/2016 Prognosis for Safe Diet Advancement Good Barriers to Reach Goals Cognitive deficits Barriers/Prognosis Comment -- CHL IP DIET RECOMMENDATION 05/10/2016 SLP Diet Recommendations Regular solids;Thin liquid Liquid Administration via Cup;Straw Medication Administration Whole meds with liquid Compensations Minimize environmental distractions;Slow rate;Small sips/bites Postural Changes Seated upright at 90 degrees   CHL IP OTHER RECOMMENDATIONS 05/10/2016 Recommended Consults -- Oral Care Recommendations Oral care BID Other Recommendations --   CHL IP FOLLOW UP RECOMMENDATIONS 05/10/2016 Follow up Recommendations Other (comment)   CHL IP FREQUENCY AND DURATION 05/09/2016 Speech Therapy Frequency (ACUTE ONLY) min 2x/week Treatment Duration 2 weeks      CHL IP ORAL PHASE  05/10/2016 Oral Phase Impaired Oral - Pudding Teaspoon -- Oral - Pudding Cup -- Oral - Honey Teaspoon -- Oral - Honey Cup -- Oral - Nectar Teaspoon -- Oral - Nectar Cup -- Oral - Nectar Straw -- Oral - Thin Teaspoon -- Oral - Thin Cup -- Oral  - Thin Straw -- Oral - Puree -- Oral - Mech Soft -- Oral - Regular Impaired mastication;Delayed oral transit Oral - Multi-Consistency -- Oral - Pill -- Oral Phase - Comment --  CHL IP PHARYNGEAL PHASE 05/10/2016 Pharyngeal Phase Impaired Pharyngeal- Pudding Teaspoon -- Pharyngeal -- Pharyngeal- Pudding Cup -- Pharyngeal -- Pharyngeal- Honey Teaspoon -- Pharyngeal -- Pharyngeal- Honey Cup -- Pharyngeal -- Pharyngeal- Nectar Teaspoon -- Pharyngeal -- Pharyngeal- Nectar Cup -- Pharyngeal -- Pharyngeal- Nectar Straw -- Pharyngeal -- Pharyngeal- Thin Teaspoon -- Pharyngeal -- Pharyngeal- Thin Cup Delayed swallow initiation-pyriform sinuses;Delayed swallow initiation-vallecula Pharyngeal -- Pharyngeal- Thin Straw Delayed swallow initiation-vallecula;Delayed swallow initiation-pyriform sinuses Pharyngeal -- Pharyngeal- Puree -- Pharyngeal -- Pharyngeal- Mechanical Soft -- Pharyngeal -- Pharyngeal- Regular Pharyngeal residue - valleculae Pharyngeal -- Pharyngeal- Multi-consistency -- Pharyngeal -- Pharyngeal- Pill -- Pharyngeal -- Pharyngeal Comment --  CHL IP CERVICAL ESOPHAGEAL PHASE 05/10/2016 Cervical Esophageal Phase WFL Pudding Teaspoon -- Pudding Cup -- Honey Teaspoon -- Honey Cup -- Nectar Teaspoon -- Nectar Cup -- Nectar Straw -- Thin Teaspoon -- Thin Cup -- Thin Straw -- Puree -- Mechanical Soft -- Regular -- Multi-consistency -- Pill -- Cervical Esophageal Comment -- No flowsheet data found. Royce MacadamiaLitaker, Lisa Willis 05/10/2016, 1:30 PM Breck CoonsLisa Willis Lonell FaceLitaker M.Ed CCC-SLP Pager [phone]               Medications:  Scheduled: . aspirin  81 mg Oral Daily  . aspirin  300 mg Rectal Once  . calcium acetate  667 mg Oral TID WC  . cloNIDine  0.2 mg Transdermal Weekly  . doxercalciferol  6 mcg Intravenous Q T,Th,Sa-HD  . heparin  5,000 Units Subcutaneous Q8H  . insulin aspart  0-9 Units Subcutaneous TID WC  . multivitamin  1 tablet Oral QHS  . thiamine injection  250 mg Intravenous TID   Continuous: . sodium chloride  50 mL/hr at 05/10/16 1532   AVW:[id number]:sodium chloride, sodium chloride, acetaminophen **OR** acetaminophen (TYLENOL) oral liquid 160 mg/5 mL **OR** acetaminophen, hydrALAZINE  Assessment/Plan: 71 years old AAM with history of renal failure on HD presented with AMS in the setting of missing dialysis. He speaks Guinnese very well and I was able to get an interpretor who helped with interpretation. He was communicating fairly well with the communicator however was not oriented to time at all. I am not sure if this is his neurological baseline however family reports he is usually oriented to time. He does have memory issues. Ddx include hypertensive encephalopathy vs Hashimoto's encephalopathy vs Binswanger's disease (MRI shows extensive subcortical white matter disease). I don't think this represent a paraneoplastic or autoimmune type of encephalopathy.  Recommend -Aggressive control of BP -Will send off Antithyroid and thyroid peroxidase antibodies to rule out this possibility -I have asked his daughter to visit and tell us how far out he is from his baseline.  Neurology will continue to follow along. Please call with questions.   LOS: 3 days   Luan PullingQaiser Karishma Unrein

## 2016-05-12 LAB — RENAL FUNCTION PANEL
ALBUMIN: 2.9 g/dL — AB (ref 3.5–5.0)
Anion gap: 12 (ref 5–15)
BUN: 25 mg/dL — ABNORMAL HIGH (ref 6–20)
CO2: 29 mmol/L (ref 22–32)
CREATININE: 4.82 mg/dL — AB (ref 0.61–1.24)
Calcium: 9 mg/dL (ref 8.9–10.3)
Chloride: 91 mmol/L — ABNORMAL LOW (ref 101–111)
GFR, EST AFRICAN AMERICAN: 13 mL/min — AB (ref >60)
GFR, EST NON AFRICAN AMERICAN: 11 mL/min — AB (ref >60)
Glucose, Bld: 247 mg/dL — ABNORMAL HIGH (ref 65–99)
PHOSPHORUS: 4 mg/dL (ref 2.5–4.6)
Potassium: 4.5 mmol/L (ref 3.5–5.1)
SODIUM: 132 mmol/L — AB (ref 135–145)

## 2016-05-12 LAB — GLUCOSE, CAPILLARY
GLUCOSE-CAPILLARY: 88 mg/dL (ref 65–99)
Glucose-Capillary: 224 mg/dL — ABNORMAL HIGH (ref 65–99)
Glucose-Capillary: 516 mg/dL (ref 65–99)
Glucose-Capillary: 252 mg/dL — ABNORMAL HIGH (ref 65–99)

## 2016-05-12 LAB — VITAMIN B1: VITAMIN B1 (THIAMINE): 103.1 nmol/L (ref 66.5–200.0)

## 2016-05-12 MED ORDER — INSULIN ASPART PROT & ASPART (70-30 MIX) 100 UNIT/ML ~~LOC~~ SUSP: 20.0000 [IU] | Freq: Two times a day (BID) | SUBCUTANEOUS | Status: DC

## 2016-05-12 MED ORDER — INSULIN ASPART 100 UNIT/ML ~~LOC~~ SOLN
10.0000 [IU] | Freq: Once | SUBCUTANEOUS | Status: AC
  Administered 2016-05-12: 10 [IU] via SUBCUTANEOUS

## 2016-05-12 MED ORDER — INSULIN ASPART PROT & ASPART (70-30 MIX) 100 UNIT/ML ~~LOC~~ SUSP
20.0000 [IU] | Freq: Two times a day (BID) | SUBCUTANEOUS | Status: DC
  Filled 2016-05-12: qty 10

## 2016-05-12 MED ORDER — LABETALOL HCL 200 MG PO TABS
300.0000 mg | ORAL_TABLET | Freq: Two times a day (BID) | ORAL | Status: DC
  Administered 2016-05-12 – 2016-05-31 (×30): 300 mg via ORAL
  Filled 2016-05-12 (×34): qty 1

## 2016-05-12 MED ORDER — CARVEDILOL 12.5 MG PO TABS: 25.0000 mg | ORAL_TABLET | Freq: Two times a day (BID) | ORAL | Status: DC

## 2016-05-12 MED ORDER — INSULIN ASPART PROT & ASPART (70-30 MIX) 100 UNIT/ML ~~LOC~~ SUSP
15.0000 [IU] | Freq: Two times a day (BID) | SUBCUTANEOUS | Status: DC
  Administered 2016-05-12 – 2016-05-13 (×2): 15 [IU] via SUBCUTANEOUS
  Filled 2016-05-12: qty 10

## 2016-05-12 NOTE — Progress Notes (Signed)
   Subjective: No acute overnight events. Patient was sleeping this morning, was not able to wake up for exam. Will come back later to reassess patient's mental status.   Objective:  Vital signs in last 24 hours: Vitals:   05/12/16 0142 05/12/16 0230 05/12/16 0500 05/12/16 0604  BP: (!) 188/98 (!) 173/109  (!) 171/84  Pulse: 84 75  74  Resp: 20   18  Temp: 99.2 F (37.3 C)   97.9 F (36.6 C)  TempSrc: Oral   Oral  SpO2: 99%   90%  Weight:   73.8 kg (162 lb 11.2 oz)   Height:       Physical Exam  Constitutional:  Resting, difficult to wake up  Cardiovascular: Normal rate and regular rhythm.   Abdominal: Soft. Bowel sounds are normal.  Musculoskeletal: He exhibits no edema.    Assessment/Plan:  Principal Problem:   Altered mental state Active Problems:   Type 2 diabetes mellitus (HCC)   Hypertension, renal disease   ESRD on hemodialysis (HCC)   Labile blood glucose   Anemia of chronic disease   71 y.o. male with HTN, DM, ESRD on HD, who presents w/ 5 day h/o altered mental status concerning for stroke versus uremia in the setting of missed HD.  AMS Patient was sleeping this morning, was not able to wake up for exam. Patient was sleeping this morning, was not able to wake up for exam. Thyroid could be possible etiology for AMS, however lab results have been conflicting (low TSH, elevated free T4, low free T3). EEG was performed and was found to be abnormal due to moderate diffuse slowing of the waking background. This could be from diffuse cerebral dysfunction that is non-specific in etiology and can be seen with hypoxic/ischemic injury, toxic/metabolic encephalopathies, neurodegenerative disorders, or medication effect. 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 Apathertic hyperthyroidism and Hasimoto's encephalopathy are on the differential. CVA is less likely given MRI results. -- monitor on telemetry -- thiamine IV 250 mg 3 times a day --  cont ASA -- f/u thyroid antibodies -- f/u blood cultures - NGTD  T2DM A1C 12.3 (05/08/16) -- SSI  HTN Poorly controlled HTN. 190/122 on admission. BP continues to remain elevated during hospital stay. Will start restarting BP medications slowly as stroke seems less likely etiology for AMS. Nephro following for HD. - HD per Nephrology - Clonidine patch if unable to tolerate orals - Resume home Hydralazine 50 mg po TID, restarted carvedilol 25 mg twice a day - Can consider restarting Imdur and his blood pressures tolerate his carvedilol dosage. - Hydralazine PRN BP > 185/110. - Continue to monitor   ESRD  Had HD yesterday. Will continue HD as recommended by Nephro. -- cont HD w/ recommendation from Nephro  Fluids: None Diet: Carb Modified DVT Prophylaxis: SQH Code Status: FULL  Dispo: Anticipated discharge pending clinical course.  Michael Dorsey, Medical Student 05/12/2016, 8:43 AM Pager: [phone]

## 2016-05-12 NOTE — Progress Notes (Signed)
Subjective: Follows commands, attempting to get out of bed. Eating well   Exam: Vitals:   05/12/16 0230 05/12/16 0604  BP: (!) 173/109 (!) 171/84  Pulse: 75 74  Resp:  18  Temp:  97.9 F (36.6 C)        Gen: In bed, NAD MS: awake, alert to self again only,not know name of hospital, only answers when desires.  CN:2-12 intact Motor: 5/5 throughout Sensory: intact  Pertinent Labs/Diagnostics: EEG and MRI non revealing for etiology of confusion.     Impression: 71 years old AAM with history of renal failure on HD presented with AMS in the setting of missing dialysis. Currently TSH 0.120, Free T4 1.23, Free T3 continues to trend down at 1.0.  At this point no neurological etiology for his confusion. His BP continues to be uncontrolled by primary team with BP 173/109, 188/98/171/84. Differential still remains, Hashimoto's encephalopathy vs Binswanger's disease (MRI shows extensive subcortical white matter disease).   Recommendations:  -Aggressive control of BP -Will send off Antithyroid and thyroid peroxidase antibodies to rule out this possibility  Neurology will S/O    Felicie MornDavid Sehar Sedano PA-C [facility] [phone] 05/12/2016, 9:30 AM

## 2016-05-12 NOTE — Progress Notes (Signed)
Patient keeps taking cardiac monitoring pads and wires off.  Patient has altered mental status.  RN contacted provider and cardiac monitoring was discontinued.  RN will continue to monitor patient.

## 2016-05-12 NOTE — Progress Notes (Signed)
Subjective:  No acute events overnight. Patient was sleeping this morning and was difficult to wake him up and keep his attention for an exam. I'm unable to assess if his mental status has improved at this time. Per nursing, the patient was much more interactive yesterday evening. I will come back later this morning early afternoon to formally reassess his mental status again.  Objective:  Vital signs in last 24 hours: Vitals:   05/12/16 0142 05/12/16 0230 05/12/16 0500 05/12/16 0604  BP: (!) 188/98 (!) 173/109  (!) 171/84  Pulse: 84 75  74  Resp: 20   18  Temp: 99.2 F (37.3 C)   97.9 F (36.6 C)  TempSrc: Oral   Oral  SpO2: 99%   90%  Weight:   162 lb 11.2 oz (73.8 kg)   Height:       General: Asleep in bed Neck: No appreciable thyromegaly or palpable nodules Cardiac: RRR  Pulm: clear to auscultation anteriorly Ext: Mild bilateral lower extremity edema Neuro: Asleep. Difficult to wake up. Will return later today for formal examination.   Assessment/Plan:  Principal Problem:   Altered mental state Active Problems:   Type 2 diabetes mellitus (HCC)   Hypertension, renal disease   ESRD on hemodialysis (HCC)   Labile blood glucose   Anemia of chronic disease   Michael Anane-Brobbeyis a 70 y.o.malewith HTN, DM, ESRD on HD, who presents w/ 5 day h/o altered mental status concerning for stroke versus uremia in the setting of missed HD. For more potential etiologies include in the differential diagnosis please see below.  AMS The patient's mental status was difficult to assess this morning as he was asleep and was difficult to wake up and maintain his attention. Per nursing the patient was awake the majority of the evening. I will return later this afternoon to formally evaluate his mental status again.  His thyroid hormone panel was conflicting (low TSH, elevated free T4, low free T3). Thyroid uptake scan is not yet completed. EEG was non-specific with changes that can be  seen with hypoxic/ischemic injury, toxic/metabolic encephalopathies, neurodegenerative disorders, or medication effect. MRI did not show evidence of acute infarction but did show changes suggestive of vascular dementia. We will follow up on the thyroid studies as apathetic hyperthyroidism and Hashimoto's encephalopathy are in the differential. As MRI results suggest that acute CVA is less likely, we will work towards more aggressive blood pressure control as hypertensive encephalopathy may also explain his symptoms. - Telemetry - Vitamin B1 levels within normal limits. Complete thiamine IV 250 mg 3 times a day - Continue aspirin - Repeat TSH low (0.120), free T4 elevated (1.23), free T3 low (1.0) - f/u Thyroid antibodies - F/U blood cultures- currently no growth to date   IDDM: - A1c 12.3 (05/08/16) - SSI-S  Malignant HTN: Uncontrolled. BP 190/122 on admission. His blood pressure remains elevated and his most recent blood pressure was 171/84. He will continue with scheduled HD per nephrology. Will start adding back home medications if able to tolerate oral meds as acute CVA is less likely given MRI results. Restart carvedilol 25 mg twice a day today. - HD per Nephrology - Clonidine patch if unable to tolerate orals - Resume home Hydralazine 50 mg po TID, restarted carvedilol 25 mg twice a day - Can consider restarting Imdur and his blood pressures tolerate his carvedilol dosage. - Hydralazine PRN BP > 185/110. - Continue to monitor  ESRD on MWF HD: - HD this morning off  schedule - Continue HD recommendations per nephrology  DVT/PE prophylaxis: Heparin FEN/GI: Carb modified  Dispo: Anticipated discharge pending clinical course.  Thomasene LotJames Ambrie Carte, MD 05/12/2016, 8:18 AM Pager: [phone]

## 2016-05-12 NOTE — Progress Notes (Addendum)
[facility] Progress Note   Subjective: Seen on HD. More alert and conversant, more mobile, still quite disoriented  Vitals:   05/12/16 0142 05/12/16 0230 05/12/16 0500 05/12/16 0604  BP: (!) 188/98 (!) 173/109  (!) 171/84  Pulse: 84 75  74  Resp: 20   18  Temp: 99.2 F (37.3 C)   97.9 F (36.6 C)  TempSrc: Oral   Oral  SpO2: 99%   90%  Weight:   73.8 kg (162 lb 11.2 oz)   Height:        Inpatient medications: . aspirin  81 mg Oral Daily  . aspirin  300 mg Rectal Once  . calcium acetate  667 mg Oral TID WC  . carvedilol  25 mg Oral BID  . cloNIDine  0.2 mg Transdermal Weekly  . doxercalciferol  6 mcg Intravenous Q T,Th,Sa-HD  . heparin  5,000 Units Subcutaneous Q8H  . hydrALAZINE  50 mg Oral Q8H  . insulin aspart  0-9 Units Subcutaneous TID WC  . multivitamin  1 tablet Oral QHS  . thiamine injection  250 mg Intravenous TID   . sodium chloride 50 mL/hr at 05/10/16 1532   sodium chloride, sodium chloride, acetaminophen **OR** acetaminophen (TYLENOL) oral liquid 160 mg/5 mL **OR** acetaminophen, hydrALAZINE  Exam: More alert; responding to questions, "doctor's office", rambling off No jvd Chest clear bilat RRR 2/6 murmur Abd soft ntnd Ext no LE edema LUA AVF+ bruit Neuro - answering questions disoriented to place/time   Dialysis: MWF [facility]  4h  2/2 bath  P2  LUA AVF   76kg   Hep 7100 Venofer 50/wk Mircera 50 q 2, last 2.7 Hect 6 ug      Assessment: 1. AMS/ aphasia - unclear cause, neuro/ primary team is working up, improving gradually. HTN vs thyroid vs Binswanger 2. ESRD HD mwf 3. Volume - 2-3kg under dry wt, bp's stil high 4. HTN - BP's still up, change Coreg > labetalol, cont clonidine/ hydral 5. 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  Plan - HD Monday, UF 2kg as tol  [facility] pager [phone]   05/12/2016, 9:53 AM     Recent Labs Lab 05/10/16 0213 05/11/16 0656 05/12/16 0358  NA 137 135  132*  K 3.7 4.0 4.5  CL 98* 99* 91*  CO2 27 22 29   GLUCOSE 81 182* 247*  BUN 31* 50* 25*  CREATININE 5.21* 7.23* 4.82*  CALCIUM 9.2 9.6 9.0  PHOS 5.0* 5.4* 4.0    Recent Labs Lab 05/08/16 1622  05/10/16 0213 05/11/16 0656 05/12/16 0358  AST 13*  --   --   --   --   ALT 15*  --   --   --   --   ALKPHOS 52  --   --   --   --   BILITOT 0.8  --   --   --   --   PROT 6.3*  --   --   --   --   ALBUMIN 3.2*  < > 3.2* 3.2* 2.9*  < > = values in this interval not displayed.  Recent Labs Lab 05/08/16 1622  05/09/16 1307 05/10/16 0213 05/11/16 0655  WBC 6.3  --  6.7 6.6 5.9  NEUTROABS 5.0  --   --   --   --   HGB 9.9*  < > 11.9* 11.2* 11.2*  HCT 28.8*  < > 35.2* 34.1* 33.8*  MCV 86.2  --  87.6 88.6 89.4  PLT 179  --  203 209 196  < > = values in this interval not displayed. Iron/TIBC/Ferritin/ %Sat No results found for: IRON, TIBC, FERRITIN, IRONPCTSAT

## 2016-05-13 ENCOUNTER — Inpatient Hospital Stay (HOSPITAL_COMMUNITY): Payer: Medicaid Other

## 2016-05-13 DIAGNOSIS — Z79899 Other long term (current) drug therapy: Secondary | ICD-10-CM

## 2016-05-13 DIAGNOSIS — R739 Hyperglycemia, unspecified: Secondary | ICD-10-CM

## 2016-05-13 LAB — CBC
HCT: 29 % — ABNORMAL LOW (ref 39.0–52.0)
Hemoglobin: 9.5 g/dL — ABNORMAL LOW (ref 13.0–17.0)
MCH: 29.2 pg (ref 26.0–34.0)
MCHC: 32.8 g/dL (ref 30.0–36.0)
MCV: 89.2 fL (ref 78.0–100.0)
PLATELETS: 171 10*3/uL (ref 150–400)
RBC: 3.25 MIL/uL — ABNORMAL LOW (ref 4.22–5.81)
RDW: 14.5 % (ref 11.5–15.5)
WBC: 5.2 10*3/uL (ref 4.0–10.5)

## 2016-05-13 LAB — RENAL FUNCTION PANEL
ALBUMIN: 2.8 g/dL — AB (ref 3.5–5.0)
Anion gap: 12 (ref 5–15)
BUN: 43 mg/dL — AB (ref 6–20)
CO2: 25 mmol/L (ref 22–32)
Calcium: 9.3 mg/dL (ref 8.9–10.3)
Chloride: 96 mmol/L — ABNORMAL LOW (ref 101–111)
Creatinine, Ser: 6.78 mg/dL — ABNORMAL HIGH (ref 0.61–1.24)
GFR calc Af Amer: 8 mL/min — ABNORMAL LOW (ref >60)
GFR, EST NON AFRICAN AMERICAN: 7 mL/min — AB (ref >60)
Glucose, Bld: 235 mg/dL — ABNORMAL HIGH (ref 65–99)
PHOSPHORUS: 6 mg/dL — AB (ref 2.5–4.6)
Potassium: 4.3 mmol/L (ref 3.5–5.1)
SODIUM: 133 mmol/L — AB (ref 135–145)

## 2016-05-13 LAB — GLUCOSE, CAPILLARY
GLUCOSE-CAPILLARY: 169 mg/dL — AB (ref 65–99)
GLUCOSE-CAPILLARY: 202 mg/dL — AB (ref 65–99)
GLUCOSE-CAPILLARY: 111 mg/dL — AB (ref 65–99)
Glucose-Capillary: 74 mg/dL (ref 65–99)
Glucose-Capillary: 183 mg/dL — ABNORMAL HIGH (ref 65–99)

## 2016-05-13 LAB — CULTURE, BLOOD (ROUTINE X 2)
CULTURE: NO GROWTH
Culture: NO GROWTH

## 2016-05-13 LAB — THYROID ANTIBODIES
Thyroglobulin Antibody: <1 IU/mL (ref 0.0–0.9)
Thyroperoxidase Ab SerPl-aCnc: 7 IU/mL (ref 0–34)

## 2016-05-13 MED ORDER — PENTAFLUOROPROP-TETRAFLUOROETH EX AERO: 1.0000 "application " | INHALATION_SPRAY | CUTANEOUS | Status: DC | PRN

## 2016-05-13 MED ORDER — SODIUM CHLORIDE 0.9 % IV SOLN: 100.0000 mL | INTRAVENOUS | Status: DC | PRN

## 2016-05-13 MED ORDER — HEPARIN SODIUM (PORCINE) 1000 UNIT/ML DIALYSIS
3500.0000 [IU] | INTRAMUSCULAR | Status: DC | PRN
  Filled 2016-05-13: qty 4

## 2016-05-13 MED ORDER — HEPARIN SODIUM (PORCINE) 1000 UNIT/ML DIALYSIS
1000.0000 [IU] | INTRAMUSCULAR | Status: DC | PRN
  Filled 2016-05-13: qty 1

## 2016-05-13 MED ORDER — LIDOCAINE HCL (PF) 1 % IJ SOLN: 5.0000 mL | INTRAMUSCULAR | Status: DC | PRN

## 2016-05-13 MED ORDER — LIDOCAINE-PRILOCAINE 2.5-2.5 % EX CREA: 1.0000 "application " | TOPICAL_CREAM | CUTANEOUS | Status: DC | PRN

## 2016-05-13 MED ORDER — HYDRALAZINE HCL 50 MG PO TABS
100.0000 mg | ORAL_TABLET | Freq: Three times a day (TID) | ORAL | Status: DC
  Administered 2016-05-13 – 2016-05-31 (×43): 100 mg via ORAL
  Filled 2016-05-13 (×49): qty 2

## 2016-05-13 MED ORDER — ALTEPLASE 2 MG IJ SOLR
2.0000 mg | Freq: Once | INTRAMUSCULAR | Status: DC | PRN
  Filled 2016-05-13: qty 2

## 2016-05-13 MED ORDER — HEPARIN SODIUM (PORCINE) 1000 UNIT/ML DIALYSIS: 1000.0000 [IU] | INTRAMUSCULAR | Status: DC | PRN

## 2016-05-13 MED ORDER — ALTEPLASE 2 MG IJ SOLR: 2.0000 mg | Freq: Once | INTRAMUSCULAR | Status: DC | PRN

## 2016-05-13 MED ORDER — LIDOCAINE-PRILOCAINE 2.5-2.5 % EX CREA
1.0000 "application " | TOPICAL_CREAM | CUTANEOUS | Status: DC | PRN
  Filled 2016-05-13: qty 5

## 2016-05-13 NOTE — Progress Notes (Signed)
[facility] Progress Note   Subjective: Still disoriented, trying to butter a piece of sausage this AM thinking it was bread, when asked where he grew up, he says "i've forgotten".  Vitals:   05/12/16 2118 05/13/16 0140 05/13/16 0434 05/13/16 0525  BP: (!) 162/88 (!) 151/69  (!) 157/76  Pulse: 73 67  73  Resp: 20 20  20   Temp: 97.9 F (36.6 C) 97.8 F (36.6 C)  97.6 F (36.4 C)  TempSrc: Oral Oral  Oral  SpO2: 98% 97%  100%  Weight:   75.4 kg (166 lb 3.6 oz)   Height:        Inpatient medications: . aspirin  81 mg Oral Daily  . aspirin  300 mg Rectal Once  . calcium acetate  667 mg Oral TID WC  . cloNIDine  0.2 mg Transdermal Weekly  . doxercalciferol  6 mcg Intravenous Q T,Th,Sa-HD  . heparin  5,000 Units Subcutaneous Q8H  . hydrALAZINE  50 mg Oral Q8H  . insulin aspart  0-9 Units Subcutaneous TID WC  . insulin aspart protamine- aspart  15 Units Subcutaneous BID WC  . labetalol  300 mg Oral BID  . multivitamin  1 tablet Oral QHS    acetaminophen **OR** acetaminophen (TYLENOL) oral liquid 160 mg/5 mL **OR** acetaminophen, hydrALAZINE  Exam: GEN: NAD, sitting up eating breakfast NECK No jvd PULM Chest clear bilat Cv: RRR 2/6 murmur RUSB Abd soft ntnd Ext no LE edema LUA AVF+ bruit Neuro: alert, conversant, but doesn't remember where he grew up; doing tasks improperly.  No asterixis  Dialysis: MWF AF  4h  2/2 bath  P2  LUA AVF   76kg   Hep 7100 Venofer 50/wk Mircera 50 q 2, last 2.7 Hect 6 ug      Assessment/ Plan: 1. AMS/ aphasia - unclear cause, neuro/ primary team is working up, improving gradually. HTN vs thyroid vs subcortical leukoencephalopathy.  Since improving unclear if LP would be of any benefit. 2. ESRD HD mwf, plan for HD today 3. Volume - 2-3kg under dry wt, bp's stil high- doesn't appear vol overloaded 4. HTN - BP's still up, change Coreg > labetalol, cont clonidine/ hydral, increase hydralazine to 100 TID.   5. 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9*  < > = values in this interval not displayed.  Recent Labs Lab 05/08/16 1622  05/09/16 1307 05/10/16 0213 05/11/16 0655  WBC 6.3  --  6.7 6.6 5.9  NEUTROABS 5.0  --   --   --   --   HGB 9.9*  < > 11.9* 11.2* 11.2*  HCT 28.8*  < > 35.2* 34.1* 33.8*  MCV 86.2  --  87.6 88.6 89.4  PLT 179  --  203 209 196  < > = values in this interval not displayed. Iron/TIBC/Ferritin/ %Sat No results found for: IRON, TIBC, FERRITIN, IRONPCTSAT

## 2016-05-13 NOTE — Progress Notes (Signed)
Subjective:  No acute events overnight. Patient much improved this morning. He is alert and oriented to self and place but not time. He remembered that he had 3 daughters and was able to state their names. He was more interactive and asked what the results of this testing and been thus far. Although he is improved I do not think that he is near his baseline at this time.  I spoke with his daughters extensively yesterday afternoon and he was at his baseline approximately 2 weeks ago. At that time he was able to converse normally and carry out all of his ADLs without assistance. They think he is currently improved from what he was when they brought him to the hospital but he is clearly not back to his baseline. However, I am encouraged to see that he is making some improvements with his mental status.  Objective:  Vital signs in last 24 hours: Vitals:   05/13/16 0140 05/13/16 0434 05/13/16 0525 05/13/16 0900  BP: (!) 151/69  (!) 157/76 (!) 145/72  Pulse: 67  73 65  Resp: 20  20 18   Temp: 97.8 F (36.6 C)  97.6 F (36.4 C)   TempSrc: Oral  Oral   SpO2: 97%  100% 98%  Weight:  166 lb 3.6 oz (75.4 kg)    Height:       Physical Exam  Constitutional: He appears well-developed and well-nourished.  HENT:  Head: Normocephalic and atraumatic.  Cardiovascular: Normal rate and regular rhythm.   Murmur heard. Flow murmur  Respiratory: Effort normal and breath sounds normal. No respiratory distress. He has no wheezes.  Musculoskeletal: He exhibits no edema.  Neurological: He is alert.  Patient alert, oriented to person and place. Not oriented to time. Mental status much improved from prior. Able to follow commands.     Assessment/Plan:  Principal Problem:   Altered mental state Active Problems:   Type 2 diabetes mellitus (HCC)   Hypertension, renal disease   ESRD on hemodialysis (HCC)   Labile blood glucose   Anemia of chronic disease   Mr.Gamaliel Anane-Brobbeyis a 71  y.o.malewith HTN, DM, ESRD on HD, who presents w/ 5 day h/o altered mental status of unclear etiology.  # AMS Patient's mental status improved this morning. He is alert to person and place but not time. Overall he is doing better. The exact etiology of his altered mental status is not clear. At this time I think it is important to get a lumbar puncture to rule out infection or other etiology. Hopefully, he will continue to make improvements with his mental status but for thoroughness and since no clear etiologies have been defined I think going forward for lumbar puncture is most appropriate. We will also check RPR - Telemetry - Vitamin B1 levels within normal limits. Complete thiamine IV 250 mg 3 times a day - Continue aspirin - Repeat TSH low (0.120), free T4 elevated (1.23), free T3 low (1.0) - f/u Thyroid antibodies - F/U blood cultures- currently no growth to date  - RPR - Lumbar puncture with the following: Gram stain, culture, viral PCR, protein, glucose, RPR - PT consult # IDDM Patient has labile and difficult to control glucose. I will discuss with diabetes management today about a better regimen going forward now that he is taking increased by mouth intake. - A1c 12.3 (05/08/16) - SSI-S  # Malignant HTN: Blood pressure improving, most recent blood pressure of 145/72. Part of his mental status may have been secondary to  hypertensive encephalopathy. His mental status has improved with his blood pressure however I'm not sure if this is the cause. We'll continue the workup as stated above. We will continue with aggressive blood pressure management as outlined below. - HD per Nephrology - Clonidine patch if unable to tolerate orals - Resume home Hydralazine 100 mg po TID - Labetalol 300 mg twice a day - Can consider restarting Imdur today if BP continues to tolerate previous changes well - Hydralazine PRN BP > 185/110.  ESRD on MWF HD: - HD this morning off schedule - Continue  HD recommendations per nephrology  DVT/PE prophylaxis: Heparin FEN/GI: Carb modified  Dispo: Anticipated discharge pending clinical course.  Thomasene LotJames Leeman Johnsey, MD 05/13/2016, 11:31 AM Pager: [phone]

## 2016-05-13 NOTE — Progress Notes (Signed)
Occupational Therapy Treatment Patient Details Name: Michael BookmanJohn Dorsey MRN: [id number] DOB: Aug 16, 1945 Today's Date: 05/13/2016    History of present illness 71 year old man with a history of end-stage renal disease requiring hemodialysis, diabetes, hypertension, and hyperlipidemia who presents with a five-day history of altered mental status. MRI on 2/9 showed no acute intracranial abnormality.    OT comments  Pt seen for ADL retraining session today with focus on bathing, dressing, naming objects during grooming and following commands. Overall, pt was able to follow 1 step commands ~60% of the time. He required moderate verbal and tactile cues to stop brushing his teeth and spit into pan. He correctly named toothbrush, toothpaste, washcloth. He also correctl;y gave me his name, DOB. He did not know, day, month or year.  Progressing nicely toward OT goals at this time. HD later today per chart review.   Follow Up Recommendations  CIR;Supervision/Assistance - 24 hour    Equipment Recommendations  Other (comment) (Defer to next venue)    Recommendations for Other Services PT consult;Rehab consult    Precautions / Restrictions Precautions Precautions: Fall Restrictions Weight Bearing Restrictions: No       Mobility Bed Mobility Overal bed mobility: Needs Assistance Bed Mobility: Supine to Sit;Sit to Supine     Supine to sit: Supervision Sit to supine: Supervision   General bed mobility comments: increased time and multimodal cues needed to participate  Transfers Overall transfer level: Needs assistance Equipment used: None Transfers: Sit to/from Stand Sit to Stand: Min assist              Balance Overall balance assessment: Needs assistance Sitting-balance support: No upper extremity supported;Feet supported Sitting balance-Leahy Scale: Good     Standing balance support: No upper extremity supported;During functional activity Standing balance-Leahy Scale:  Poor Standing balance comment: min A for balance initially                   ADL Overall ADL's : Needs assistance/impaired     Grooming: Wash/dry hands;Wash/dry face;Oral care;Applying deodorant;Sitting;Cueing for sequencing;Supervision/safety;Set up   Upper Body Bathing: Supervision/ safety;Set up;Cueing for sequencing;Sitting   Lower Body Bathing: Set up;Min guard;Sit to/from stand;Sitting/lateral leans;Cueing for sequencing   Upper Body Dressing : Sitting;Set up;Min guard   Lower Body Dressing: Supervision/safety;Sit to/from stand;Cueing for sequencing (Pt donned socks sitting EOB w/ Supervision and cues)     Toilet Transfer Details (indicate cue type and reason): Pt had urinated in bed. He was assisted with ADL's and bathing and linens were changed. Min A assist sit-stand           General ADL Comments: Pt seen for ADL retraining session today with focus on bathing, dressing, naming objects during grooming and following commands. Overall, pt was able to follow 1 step commands ~60% of the time. He required moderate verbal and tactile cues to stop brushing his teeth and spit into pan. He correctly named toothbrush, toothpaste, washcloth. He also correctl;y gave me his name, DOB. He did not know, day, month or year.      Vision  Difficult to assess secondary to cognition. Cont to assess in functional context.               Additional Comments: Difficult to assess secondary to impaired cognition. Cont to assess in functional context.   Perception     Praxis      Cognition   Behavior During Therapy: WFL for tasks assessed/performed Overall Cognitive Status: Impaired/Different from baseline Area of Impairment: Orientation;Following commands Orientation  Level: Disoriented to;Place;Time      Following Commands: Follows one step commands with increased time     Problem Solving: Slow processing;Decreased initiation;Requires verbal cues;Requires tactile  cues General Comments: increased time and multimodal cues for participation in ADL retraining    Extremity/Trunk Assessment               Exercises     Shoulder Instructions       General Comments      Pertinent Vitals/ Pain       Pain Assessment: No/denies pain Faces Pain Scale: No hurt  Home Living                                          Prior Functioning/Environment              Frequency  Min 2X/week        Progress Toward Goals  OT Goals(current goals can now be found in the care plan section)  Progress towards OT goals: Progressing toward goals  Acute Rehab OT Goals Patient Stated Goal: pt unable to state  Plan Discharge plan remains appropriate    Co-evaluation                 End of Session     Activity Tolerance Patient tolerated treatment well   Patient Left in bed;with call bell/phone within reach;with bed alarm set   Nurse Communication Other (comment) (ADL's completed)        Time: 1610-9604 OT Time Calculation (min): 28 min  Charges: OT General Charges $OT Visit: 1 Procedure OT Treatments $Self Care/Home Management : 23-37 mins  Michael Dorsey Michael Dorsey, OTR/L 05/13/2016, 10:49 AM

## 2016-05-13 NOTE — Progress Notes (Signed)
Rehab admissions - Please see rehab consult done today by Dr. Allena KatzPatel recommending SNF or home with family if patient progresses.  Not appropriate for acute inpatient rehab admission at this time.  #[phone]

## 2016-05-13 NOTE — Procedures (Signed)
Patient seen and examined on Hemodialysis. QB 400 UF goal 2.5 L.    Treatment adjusted as needed.  Please let us know when LP is planned so we can hold heparin with HD.  Bufford ButtnerElizabeth Mija Effertz MD [facility] pgr [phone] 3:13 PM

## 2016-05-13 NOTE — Progress Notes (Signed)
PT Cancellation Note  Patient Details Name: Michael BookmanJohn Dorsey MRN: [id number] DOB: [birth year]   Cancelled Treatment:    Reason Eval/Treat Not Completed: Patient at procedure or test/unavailable (pt at dialysis)   Ayesha RumpfMary Moody Ledford Goodson 05/13/2016, 4:32 PM Kerrin MoMary M Terin Cragle, [place]PTA Pager [phone]

## 2016-05-13 NOTE — Progress Notes (Signed)
Subjective: Patient is doing much better today. Dr.Taylor was able to talk to family yesterday and get sense of patient's baseline before admission.Family states that patient is doing better than he was on admission, but not at his baseline. Patient is oriented to person and place today. Was able to answer questions.   Objective:  Vital signs in last 24 hours: Vitals:   05/13/16 0140 05/13/16 0434 05/13/16 0525 05/13/16 0900  BP: (!) 151/69  (!) 157/76 (!) 145/72  Pulse: 67  73 65  Resp: 20  20 18   Temp: 97.8 F (36.6 C)  97.6 F (36.4 C)   TempSrc: Oral  Oral   SpO2: 97%  100% 98%  Weight:  75.4 kg (166 lb 3.6 oz)    Height:       Physical Exam  Constitutional: He appears well-developed and well-nourished.  Cardiovascular: Normal rate, regular rhythm and normal heart sounds.   Pulmonary/Chest: Effort normal and breath sounds normal.  Musculoskeletal: He exhibits no edema.  Neurological:  Alert and oriented to person and place today. Responsive to questions. Speaking English.     CBG (last 3)   Recent Labs  05/12/16 2124 05/13/16 0611 05/13/16 0944  GLUCAP 88 74 169*    Assessment/Plan:  Principal Problem:   Altered mental state Active Problems:   Type 2 diabetes mellitus (HCC)   Hypertension, renal disease   ESRD on hemodialysis (HCC)   Labile blood glucose   Anemia of chronic disease  71 y.o. male with HTN, DM, ESRD on HD, who presents w/ 5 day h/o altered mental status concerning for stroke versus uremia in the setting of missed HD.  AMS Patient was the best he has been during hospital stay. Patient is oriented to person and place today. Was able to answer questions. Thyroid could be possible etiology for AMS, however lab results have been conflicting (low TSH, elevated free T4, low free T3). EEG was performed and was found to be abnormal due to moderatediffuse slowing of the waking background. This could be from diffuse cerebral dysfunction that is  non-specific in etiology and can be seen with hypoxic/ischemic injury, toxic/metabolic encephalopathies, neurodegenerative disorders, or medication effect. 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 Apathertic hyperthyroidism and Hasimoto's encephalopathy are on the differential. Could be from malignant HTN as discussed below. CVA is less likely given MRI results. Would like to rule out infectious etiology so we will request LP done today. Will order to RPR today to r/o tertiary syphilis.   -- LP today -- RPR -- monitor on telemetry -- thiamine IV 250 mg 3 times a day -- cont ASA -- f/u thyroid antibodies -- f/u blood cultures - NGTD  HTN Poorly controlled HTN. 190/122 on admission. Patient seems to be getting less altered. Malignant HTN is higher on the differential as the only significant change in treatment the past few days is treating BP. Will cont BP medications slowly as stroke seems less likely etiology for AMS. Nephro following for HD. -- Clonidine patch if unable to tolerate orals -- cont home Hydralazine 50 mg po TID,  -- cont carvedilol 25 mg po BID -- consider restarting Imdur and his blood pressures tolerate his carvedilol dosage. -- Hydralazine PRN BP > 185/110. -- cont to monitor   ESRD   -- cont HD w/ recommendation from Nephro  T2DM A1C 12.3 (05/08/16) -- SSI  Fluids: None Diet:Carb Modified DVT Prophylaxis: SQH Code Status: FULL  Dispo: Anticipated  discharge pending clinical course.  Caryn Bee, Medical Student 05/13/2016, 10:54 AM Pager: [phone]

## 2016-05-14 ENCOUNTER — Inpatient Hospital Stay (HOSPITAL_COMMUNITY): Payer: Medicaid Other

## 2016-05-14 DIAGNOSIS — G039 Meningitis, unspecified: Principal | ICD-10-CM

## 2016-05-14 DIAGNOSIS — Z8639 Personal history of other endocrine, nutritional and metabolic disease: Secondary | ICD-10-CM

## 2016-05-14 DIAGNOSIS — G0490 Encephalitis and encephalomyelitis, unspecified: Secondary | ICD-10-CM

## 2016-05-14 DIAGNOSIS — I120 Hypertensive chronic kidney disease with stage 5 chronic kidney disease or end stage renal disease: Secondary | ICD-10-CM

## 2016-05-14 DIAGNOSIS — I639 Cerebral infarction, unspecified: Secondary | ICD-10-CM

## 2016-05-14 DIAGNOSIS — A86 Unspecified viral encephalitis: Secondary | ICD-10-CM

## 2016-05-14 LAB — CSF CELL COUNT WITH DIFFERENTIAL
EOS CSF: 0 % (ref 0–1)
Lymphs, CSF: 98 % — ABNORMAL HIGH (ref 40–80)
Monocyte-Macrophage-Spinal Fluid: 2 % — ABNORMAL LOW (ref 15–45)
RBC Count, CSF: 2 /mm3 — ABNORMAL HIGH
Segmented Neutrophils-CSF: 0 % (ref 0–6)
TUBE #: 3
WBC, CSF: 208 /mm3 (ref 0–5)

## 2016-05-14 LAB — CBC
HEMATOCRIT: 33.6 % — AB (ref 39.0–52.0)
Hemoglobin: 10.8 g/dL — ABNORMAL LOW (ref 13.0–17.0)
MCH: 29.3 pg (ref 26.0–34.0)
MCHC: 32.1 g/dL (ref 30.0–36.0)
MCV: 91.3 fL (ref 78.0–100.0)
Platelets: 190 10*3/uL (ref 150–400)
RBC: 3.68 MIL/uL — ABNORMAL LOW (ref 4.22–5.81)
RDW: 14.8 % (ref 11.5–15.5)
WBC: 7 10*3/uL (ref 4.0–10.5)

## 2016-05-14 LAB — RPR, QUANT+TP ABS (REFLEX)
Rapid Plasma Reagin, Quant: 1:1 {titer} — ABNORMAL HIGH
TREPONEMA PALLIDUM AB: POSITIVE — AB

## 2016-05-14 LAB — PROTEIN AND GLUCOSE, CSF
Glucose, CSF: 36 mg/dL — ABNORMAL LOW (ref 40–70)
Total  Protein, CSF: 241 mg/dL — ABNORMAL HIGH (ref 15–45)

## 2016-05-14 LAB — RPR: RPR: REACTIVE — AB

## 2016-05-14 LAB — CRYPTOCOCCAL ANTIGEN, CSF: Crypto Ag: NEGATIVE

## 2016-05-14 LAB — GLUCOSE, CAPILLARY
GLUCOSE-CAPILLARY: 206 mg/dL — AB (ref 65–99)
GLUCOSE-CAPILLARY: 118 mg/dL — AB (ref 65–99)
Glucose-Capillary: 136 mg/dL — ABNORMAL HIGH (ref 65–99)
Glucose-Capillary: 242 mg/dL — ABNORMAL HIGH (ref 65–99)

## 2016-05-14 MED ORDER — INSULIN ASPART PROT & ASPART (70-30 MIX) 100 UNIT/ML ~~LOC~~ SUSP
15.0000 [IU] | Freq: Every day | SUBCUTANEOUS | Status: DC
  Administered 2016-05-14 – 2016-05-16 (×3): 15 [IU] via SUBCUTANEOUS
  Filled 2016-05-14: qty 10

## 2016-05-14 MED ORDER — LIDOCAINE HCL (PF) 1 % IJ SOLN
5.0000 mL | Freq: Once | INTRAMUSCULAR | Status: AC
  Administered 2016-05-14: 10 mL via INTRADERMAL

## 2016-05-14 MED ORDER — LIDOCAINE HCL 1 % IJ SOLN
INTRAMUSCULAR | Status: AC
  Filled 2016-05-14: qty 10

## 2016-05-14 MED ORDER — DEXTROSE 5 % IV SOLN
5.0000 mg/kg | INTRAVENOUS | Status: DC
  Administered 2016-05-14 – 2016-05-19 (×6): 365 mg via INTRAVENOUS
  Filled 2016-05-14 (×7): qty 7.3

## 2016-05-14 NOTE — Progress Notes (Signed)
Pharmacy Antibiotic Note  Michael BookmanJohn Dorsey is a 71 y.o. male with ESRD admitted on 05/08/2016 with confusion concerning for meningitis. ID is on board and has sent CSF fluid for HSV. Pharmacy has been consulted to start Acyclovir for meningoencephalitis while awaiting test results.  Plan: 1. Start Acyclovir 5 mg/kg (~365 mg) every 24 hours 2. Will monitor HD schedule/tolerance to ensure tolerating 3x/week sessions for dose appropriateness  Height: 5\' 10"  (177.8 cm) Weight: 160 lb 15 oz (73 kg) IBW/kg (Calculated) : 73  Temp (24hrs), Avg:97.7 F (36.5 C), Min:97.5 F (36.4 C), Max:97.9 F (36.6 C)   Recent Labs Lab 05/08/16 1638 05/09/16 1307 05/10/16 0213 05/11/16 0655 05/11/16 0656 05/12/16 0358 05/13/16 1244 05/14/16 1148  WBC  --  6.7 6.6 5.9  --   --  5.2 7.0  CREATININE 7.30* 3.68* 5.21*  --  7.23* 4.82* 6.78*  --   LATICACIDVEN 0.89  --   --   --   --   --   --   --     Estimated Creatinine Clearance: 10.2 mL/min (by C-G formula based on SCr of 6.78 mg/dL (H)).    No Known Allergies  Antimicrobials this admission: Acyclovir 2/13 >>  Dose adjustments this admission:  Microbiology results: 2/7 BCx >> 2/13 CSF cx >>  Thank you for allowing pharmacy to be a part of this patient's care.  Georgina PillionElizabeth Hendrik Donath, PharmD, BCPS Clinical Pharmacist Pager: [phone] 05/14/2016 6:26 PM

## 2016-05-14 NOTE — Progress Notes (Signed)
New labs ordered on CSF, main lab  [phone] made aware.

## 2016-05-14 NOTE — Progress Notes (Signed)
Physical Therapy Treatment Patient Details Name: Michael BookmanJohn Dorsey MRN: [id number] DOB: [birth year] Today's Date: 05/14/2016    History of Present Illness 71 year old man with a history of end-stage renal disease requiring hemodialysis, diabetes, hypertension, and hyperlipidemia who presents with a five-day history of altered mental status. MRI on 2/9 showed no acute intracranial abnormality.     PT Comments    Patient progressing slowly with mobility.  Remains unsteady with cane and walker too novel yet for independence.  Feel cognition is clearing, but still unsafe for being alone as has continued limitations in recall of events as well as safety procedures and deficit awareness.  Per RN case manager sister works, so pt may need STSNF unless family able to provide 24 hour assist for safety.   Follow Up Recommendations  Supervision/Assistance - 24 hour;SNF     Equipment Recommendations  Rolling walker with 5" wheels    Recommendations for Other Services       Precautions / Restrictions Precautions Precautions: Fall    Mobility  Bed Mobility         Supine to sit: Modified independent (Device/Increase time)        Transfers Overall transfer level: Needs assistance Equipment used: Straight cane Transfers: Sit to/from Stand Sit to Stand: Supervision         General transfer comment: for safety  Ambulation/Gait Ambulation/Gait assistance: Min assist Ambulation Distance (Feet): 150 Feet (x 2) Assistive device: Straight cane;Rolling walker (2 wheeled) Gait Pattern/deviations: Step-through pattern;Decreased stride length     General Gait Details: imbalance with cane veering R/L in hallway and slower speed.  Second attempt with RW and improved stability, though assist needed on turns due to no previous experience with RW per pt report   Stairs            Wheelchair Mobility    Modified Rankin (Stroke Patients Only)       Balance Overall balance  assessment: Needs assistance   Sitting balance-Leahy Scale: Good       Standing balance-Leahy Scale: Fair Standing balance comment: S for standing to urinate.                    Cognition Arousal/Alertness: Awake/alert Behavior During Therapy: WFL for tasks assessed/performed Overall Cognitive Status: Impaired/Different from baseline   Orientation Level: Disoriented to;Time     Following Commands: Follows multi-step commands inconsistently     Problem Solving: Slow processing General Comments: did not remember how long he has been here.  States he has not seen his family    Exercises      General Comments General comments (skin integrity, edema, etc.): incontinent of urine in the bed and dripped along the way to bathroom, though knew he had to go.  thorough with hygiene in the bathroom      Pertinent Vitals/Pain Faces Pain Scale: No hurt    Home Living                      Prior Function            PT Goals (current goals can now be found in the care plan section) Progress towards PT goals: Progressing toward goals    Frequency    Min 3X/week      PT Plan Discharge plan needs to be updated    Co-evaluation             End of Session Equipment Utilized During Treatment: Gait belt Activity Tolerance: Patient  tolerated treatment well Patient left: in chair;with chair alarm set;with call bell/phone within reach     Time: 1518-1550 PT Time Calculation (min) (ACUTE ONLY): 32 min  Charges:  $Gait Training: 23-37 mins                    G Codes:      [name] 2016/06/13, 4:36 PM  Sheran Lawless, PT [phone] 06/13/2016

## 2016-05-14 NOTE — Consult Note (Addendum)
Date of Admission:  05/08/2016  Date of Consult:  05/14/2016  Reason for Consult: Meningoencephalitis Referring Physician: Dr. Eppie Gibson   HPI: Michael Dorsey is an 71 y.o. male African man who has a history of end-stage disease requiring hemodialysis also with comorbid diabetes hypertension hyperlipidemia. He presented to [facility] with a five-day history of confusion. Apparently had been performing all his usual activities of daily living at baseline. Then after dialysis on Friday began refusing medications refused to attend dialysis on Monday. He had not been eating well he went to dialysis on the day of admission completed one hour but was noted to be confused brought emergently for further evaluation. He was admitted to the teaching service. CT scan of the head was obtained which showed  Age indeterminate LEFT basal ganglia lacunar infarct.  Old appearing LEFT thalamus lacunar infarct. Moderate to severe global brain atrophy and moderate to severe chronic small vessel ischemic disease.  MRi done later showed  1. No acute intracranial abnormality. 2. Severe age advanced atrophy and diffuse white matter disease reflecting the sequela of chronic microvascular ischemia. 3. Asymmetric ischemic changes in the left basal ganglia compared to the right. This accounts for the CT findings. 4. Maxillary sinus disease   He is continued to be confused however without much improvement.  He underwent lumbar puncture which showed 208 white blood cells with only 2 red blood cells and a differential of 98% lymphocytes 2% monocytes. Protein the central nervous system was 241 and glucose was 36.  His CSF the coccal antigen is negative. Gram stain of CSF was negative.  It is my understanding that CSF has been sent for herpes simplex 1 and 2 PCR as well as a VDRL and MTB PCR.  I have also requested addition of a CMV by PCR  His HIV status is still unknown also unknown whether he has  syphilis.     Past Medical History:  Diagnosis Date  . CHF (congestive heart failure) ([place])   . Diabetes mellitus without complication ([place])   . Diastolic dysfunction    heart failure with preserved LV function  . Hypercholesteremia   . Hypertension   . Renal disorder    in process of being diagnosed    Past Surgical History:  Procedure Laterality Date  . AV FISTULA PLACEMENT Left 11/10/2013   Procedure: LEFT ARM  ARTERIOVENOUS (AV) FISTULA CREATION;  Surgeon: Rosetta Posner, MD;  Location: [place];  Service: Vascular;  Laterality: Left;  . FISTULA SUPERFICIALIZATION Left 12/25/2015   Procedure: LEFT FISTULA PLICATION;  Surgeon: Conrad [place], MD;  Location: [place];  Service: Vascular;  Laterality: Left;  . NEPHRECTOMY     [facility]    Social History:  reports that he has never smoked. He has never used smokeless tobacco. He reports that he drinks alcohol. He reports that he does not use drugs.   No family history on file.  No Known Allergies   Medications: I have reviewed patients current medications as documented in Epic Anti-infectives    None         ROS: as in HPI otherwise remainder of 12 point Review of Systems is not obtainable due to patient's confusion   Blood pressure 132/66, pulse 66, temperature 97.5 F (36.4 C), temperature source Oral, resp. rate 16, height _0  (1.778 m), weight 160 lb 15 oz (73 kg), SpO2 100 %. General: Alert and awake, oriented Person but still confused HEENT: anicteric sclera,  EOMI, oropharynx clear and without exudate Cardiovascular: egular rate, normal r holosystolic murmur heard throughout the precordium Pulmonary: clear to auscultation bilaterally, no wheezing, rales or rhonchi Gastrointestinal: soft nontender, nondistended, normal bowel sounds, Musculoskeletal: no  clubbing or edema noted bilaterally Skin, fistula is clean with thrill Neuro: nonfocal, strength and sensation intact   Results for orders placed or performed during  the hospital encounter of 05/08/16 (from the past 48 hour(s))  Glucose, capillary     Status: None   Collection Time: 05/12/16  9:24 PM  Result Value Ref Range   Glucose-Capillary 88 65 - 99 mg/dL   Comment 1 Notify RN    Comment 2 Document in Chart   Glucose, capillary     Status: None   Collection Time: 05/13/16  6:11 AM  Result Value Ref Range   Glucose-Capillary 74 65 - 99 mg/dL   Comment 1 Notify RN    Comment 2 Document in Chart   Glucose, capillary     Status: Abnormal   Collection Time: 05/13/16  9:44 AM  Result Value Ref Range   Glucose-Capillary 169 (H) 65 - 99 mg/dL  Glucose, capillary     Status: Abnormal   Collection Time: 05/13/16 11:31 AM  Result Value Ref Range   Glucose-Capillary 202 (H) 65 - 99 mg/dL  CBC     Status: Abnormal   Collection Time: 05/13/16 12:44 PM  Result Value Ref Range   WBC 5.2 4.0 - 10.5 K/uL   RBC 3.25 (L) 4.22 - 5.81 MIL/uL   Hemoglobin 9.5 (L) 13.0 - 17.0 g/dL   HCT 29.0 (L) 39.0 - 52.0 %   MCV 89.2 78.0 - 100.0 fL   MCH 29.2 26.0 - 34.0 pg   MCHC 32.8 30.0 - 36.0 g/dL   RDW 14.5 11.5 - 15.5 %   Platelets 171 150 - 400 K/uL  Renal function panel     Status: Abnormal   Collection Time: 05/13/16 12:44 PM  Result Value Ref Range   Sodium 133 (L) 135 - 145 mmol/L   Potassium 4.3 3.5 - 5.1 mmol/L   Chloride 96 (L) 101 - 111 mmol/L   CO2 25 22 - 32 mmol/L   Glucose, Bld 235 (H) 65 - 99 mg/dL   BUN 43 (H) 6 - 20 mg/dL   Creatinine, Ser 6.78 (H) 0.61 - 1.24 mg/dL   Calcium 9.3 8.9 - 10.3 mg/dL   Phosphorus 6.0 (H) 2.5 - 4.6 mg/dL   Albumin 2.8 (L) 3.5 - 5.0 g/dL   GFR calc non Af Amer 7 (L) >60 mL/min   GFR calc Af Amer 8 (L) >60 mL/min    Comment: (NOTE) The eGFR has been calculated using the CKD EPI equation. This calculation has not been validated in all clinical situations. eGFR's persistently <60 mL/min signify possible Chronic Kidney Disease.    Anion gap 12 5 - 15  Glucose, capillary     Status: Abnormal   Collection Time:  05/13/16  5:17 PM  Result Value Ref Range   Glucose-Capillary 111 (H) 65 - 99 mg/dL  Glucose, capillary     Status: Abnormal   Collection Time: 05/13/16  9:47 PM  Result Value Ref Range   Glucose-Capillary 183 (H) 65 - 99 mg/dL   Comment 1 Notify RN    Comment 2 Document in Chart   Glucose, capillary     Status: Abnormal   Collection Time: 05/14/16  6:35 AM  Result Value Ref Range   Glucose-Capillary 136 (H)  65 - 99 mg/dL  Protein and glucose, CSF     Status: Abnormal   Collection Time: 05/14/16  9:39 AM  Result Value Ref Range   Glucose, CSF 36 (L) 40 - 70 mg/dL   Total  Protein, CSF 241 (H) 15 - 45 mg/dL    Comment: RESULTS CONFIRMED BY MANUAL DILUTION  CSF cell count with differential     Status: Abnormal   Collection Time: 05/14/16  9:39 AM  Result Value Ref Range   Tube # 3    Color, CSF COLORLESS COLORLESS   Appearance, CSF CLEAR CLEAR   Supernatant NOT INDICATED    RBC Count, CSF 2 (H) 0 /cu mm   WBC, CSF 208 (HH) 0 - 5 /cu mm    Comment: CRITICAL RESULT CALLED TO, READ BACK BY AND VERIFIED WITH: M. MOORE,RN 1112 ON 02.13.18 BY S. YARBROUGH    Segmented Neutrophils-CSF 0 0 - 6 %   Lymphs, CSF 98 (H) 40 - 80 %   Monocyte-Macrophage-Spinal Fluid 2 (L) 15 - 45 %   Eosinophils, CSF 0 0 - 1 %  CSF culture     Status: None (Preliminary result)   Collection Time: 05/14/16  9:39 AM  Result Value Ref Range   Specimen Description CSF    Special Requests TUBE 2    Gram Stain      WBC PRESENT, PREDOMINANTLY MONONUCLEAR NO ORGANISMS SEEN CYTOSPIN SMEAR    Culture PENDING    Report Status PENDING   Cryptococcal antigen, CSF     Status: None   Collection Time: 05/14/16  9:39 AM  Result Value Ref Range   Crypto Ag NEGATIVE NEGATIVE   Cryptococcal Ag Titer NOT INDICATED NOT INDICATED  Glucose, capillary     Status: Abnormal   Collection Time: 05/14/16 11:32 AM  Result Value Ref Range   Glucose-Capillary 206 (H) 65 - 99 mg/dL  CBC     Status: Abnormal   Collection Time:  05/14/16 11:48 AM  Result Value Ref Range   WBC 7.0 4.0 - 10.5 K/uL   RBC 3.68 (L) 4.22 - 5.81 MIL/uL   Hemoglobin 10.8 (L) 13.0 - 17.0 g/dL   HCT 33.6 (L) 39.0 - 52.0 %   MCV 91.3 78.0 - 100.0 fL   MCH 29.3 26.0 - 34.0 pg   MCHC 32.1 30.0 - 36.0 g/dL   RDW 14.8 11.5 - 15.5 %   Platelets 190 150 - 400 K/uL  Glucose, capillary     Status: Abnormal   Collection Time: 05/14/16  4:22 PM  Result Value Ref Range   Glucose-Capillary 242 (H) 65 - 99 mg/dL   _0 (sdes,specrequest,cult,reptstatus)   ) Recent Results (from the past 720 hour(s))  Blood culture (routine x 2)     Status: None   Collection Time: 05/08/16  4:22 PM  Result Value Ref Range Status   Specimen Description BLOOD RIGHT ARM  Final   Special Requests BOTTLES DRAWN AEROBIC AND ANAEROBIC 5CC  Final   Culture NO GROWTH 5 DAYS  Final   Report Status 05/13/2016 FINAL  Final  Blood culture (routine x 2)     Status: None   Collection Time: 05/08/16  4:42 PM  Result Value Ref Range Status   Specimen Description BLOOD RIGHT HAND  Final   Special Requests IN PEDIATRIC BOTTLE 4CC  Final   Culture NO GROWTH 5 DAYS  Final   Report Status 05/13/2016 FINAL  Final  CSF culture     Status: None (  Preliminary result)   Collection Time: 05/14/16  9:39 AM  Result Value Ref Range Status   Specimen Description CSF  Final   Special Requests TUBE 2  Final   Gram Stain   Final    WBC PRESENT, PREDOMINANTLY MONONUCLEAR NO ORGANISMS SEEN CYTOSPIN SMEAR    Culture PENDING  Incomplete   Report Status PENDING  Incomplete     Impression/Recommendation  Principal Problem:   Altered mental state Active Problems:   Type 2 diabetes mellitus (HCC)   Hypertension, renal disease   ESRD on hemodialysis (HCC)   Labile blood glucose   Anemia of chronic disease   Encephalopathy   Hyperglycemia   Elevated serum creatinine   Michael Dorsey is a 71 y.o. 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  Varicella-zoster is another possibility and would be worth trying to send for PCR for this organism. Typically when I have seen it manifested usually happens after an actual dermatomal mainfestation of zoster  Certainly if the patient has HIV then it opens up an array of other possibilities. First of all he could have a HIV infection of the central nervous system directly and this could be responsible for his meningoencephalitis by itself.  He could have neurosyphilis  Cryptococcus has been ruled out by his negative cryptococcal antigen.  Toxoplasmosis causing encephalitis would be possible but typically toxoplasma manifests also with ring-enhancing CNS lesions.  Cytomegalovirus infection would certainly be a possibility with HIV and AIDS and I have asked for CMV PCR to be added.  We also need to think more about the possibility of him having tuberculosis meningoencephalitis.  We want to make sure that he has had CSF sent for MTB PCR and I will consider repeating the lumbar puncture and obtaining a large volume of CSF i.e. about 32-36 mL at least filling all 4 tubes with spinal fluid  We would then  send a dedicated 10 mL to be centrifuged down and used for AFB stain and culture and similarly use another 10 mL for fungal stain and culture to look for organism such as histoplasma and Blastomyces we also try to send for histo and blasto antigens from spinal fluid  Finally he could have a chemical meningitis if for example he was taking excess nonsteroidals or he had been on trimethoprim sulfamethoxazole or another drug known to cause chemical meningitis. Carcinomatous meningitis does not seem acutely likely given the absence of lesions that would suggest a central nervous system  tumor.  Certainly a seizure can cause CSF pleocytosis but he has a fairly significant white cell count on his lumbar puncture.  I am fine with starting acyclovir for now. Again the picture does not fit very well with herpes simplex given the absence of temporal lobe enhancement the story also doesn't go very well with zoster infection.     05/14/2016, 5:06 PM   Thank you so much for this interesting consult  [place] [facility] [place] [phone] (pager) [phone] (office) 05/14/2016, 5:06 PM  Rhina Brackett [place] 05/14/2016, 5:06 PM

## 2016-05-14 NOTE — Progress Notes (Addendum)
Subjective:  No acute events overnight. When I saw the patient this morning before rounds he was sleeping. I woke him up from sleep and he did seem somewhat disoriented. After several minutes he became alert and oriented to person but not place. Again he was able to recall his daughters but was unable to state that he was in a hospital.  The patient seems to do better later in the afternoon from a mental status standpoint. I will reevaluate later today to see if his mental status was similar as yesterday which was the best we have seen since he has been in the inpatient setting.   Objective:  Vital signs in last 24 hours: Vitals:   05/13/16 1630 05/13/16 2148 05/14/16 0148 05/14/16 0512  BP: 127/60 (!) 155/85 (!) 150/72 135/67  Pulse: 70 73 68 65  Resp: 18 18 16 18   Temp: 98 F (36.7 C) 97.6 F (36.4 C) 97.8 F (36.6 C)   TempSrc: Oral Oral Oral   SpO2: 98% 100% 100% 100%  Weight: 162 lb 0.6 oz (73.5 kg)   160 lb 15 oz (73 kg)  Height:       Physical Exam  Constitutional: He appears well-developed and well-nourished.  HENT:  Head: Normocephalic and atraumatic.  Cardiovascular: Normal rate and regular rhythm.   Murmur heard. Holosystolic murmur heard best at the right upper sternal border  Respiratory: Effort normal and breath sounds normal. No respiratory distress. He has no wheezes.  Musculoskeletal: He exhibits no edema.  Neurological: He is alert.  Patient alert and oriented to self but not place or time. He is able to follow commands.     Assessment/Plan:  Principal Problem:   Altered mental state Active Problems:   Type 2 diabetes mellitus (HCC)   Hypertension, renal disease   ESRD on hemodialysis (HCC)   Labile blood glucose   Anemia of chronic disease   Encephalopathy   Hyperglycemia   Elevated serum creatinine   [name] a 71 y.o.malewith HTN, DM, ESRD on HD, who presents w/ 5 day h/o altered mental status of unclear etiology.  #  AMS 2/2 Meningitis of unclear etiology The patient was sleepy when seen today. His mental status seems to be improved later in the morning or early afternoon. I will reevaluate later today. The patient had a lumbar puncture this morning by interventional radiology and preliminary cell counts show white blood cells greater than 200. The differential has not resulted yet. This meningitis would explain the patient's altered mental status although the etiology of his meningitis is not clear at this time. We will be aggressive in our workup and management of this new finding. We have sent the CSF for multiple studies as listed below and will work on adding additional ones as deemed necessary. The patient may be at risk for multiple organisms based on his country of origin and unknown vaccination history at this time. - Continue aspirin - F/U blood cultures- currently no growth to date  - RPR - Lumbar puncture with the following: Gram stain, culture, viral PCR for HSV and JC, protein, glucose, RPR, Cell count and diff, Crypto antigen, MTB   # IDDM Patient has labile and difficult to control glucose. I will discuss with diabetes management today about a better regimen going forward now that he is taking increased by mouth intake. - A1c 12.3 (05/08/16) - SSI-S  # Hypertension Blood pressure improving, most recent blood pressure of 135/67.  - HD per Nephrology - Clonidine  patch if unable to tolerate orals - Resume home Hydralazine 100 mg po TID - Labetalol 300 mg twice a day - Continue to hold him for given current blood pressure - Hydralazine PRN BP > 185/110.  ESRD on MWF HD: - HD this morning off schedule - Continue HD recommendations per nephrology  DVT/PE prophylaxis: Heparin FEN/GI: Carb modified  Dispo: Anticipated discharge pending clinical course.  [name], MD 05/14/2016, 7:59 AM Pager: [phone]

## 2016-05-14 NOTE — Progress Notes (Signed)
   Subjective: No acute events overnight. Patient was sleepy when we saw him. After waking up, he was still less oriented than yesterday. He was able to recall his daughters but not able to recall that he was in the hospital or that he had a lumbar puncture before we visited him on rounds today.   Objective:  Vital signs in last 24 hours: Vitals:   05/13/16 1630 05/13/16 2148 05/14/16 0148 05/14/16 0512  BP: 127/60 (!) 155/85 (!) 150/72 135/67  Pulse: 70 73 68 65  Resp: 18 18 16 18   Temp: 98 F (36.7 C) 97.6 F (36.4 C) 97.8 F (36.6 C)   TempSrc: Oral Oral Oral   SpO2: 98% 100% 100% 100%  Weight: 73.5 kg (162 lb 0.6 oz)   73 kg (160 lb 15 oz)  Height:       Physical Exam  Constitutional: He appears well-developed and well-nourished.  Cardiovascular: Normal rate and regular rhythm.   Holosystolic murmur best heard at the right upper sternal border  Musculoskeletal: He exhibits no edema.  Neurological:  Alert and oriented only to self today   CBG (last 3)   Recent Labs  05/13/16 1717 05/13/16 2147 05/14/16 0635  GLUCAP 111* 183* 136*   RPR: pending  Thyroid Antibodies: negative  Lumbar Puncture: Glucose 36 Total protein 241 RBC 2 WBC 208 98% lymphocytes, 2% monocytes Gram stain Negative   Assessment/Plan:  Principal Problem:   Altered mental state Active Problems:   Type 2 diabetes mellitus (HCC)   Hypertension, renal disease   ESRD on hemodialysis (HCC)   Labile blood glucose   Anemia of chronic disease   Encephalopathy   Hyperglycemia   Elevated serum creatinine   71 y.o.malewith HTN, DM, ESRD on HD, who presents w/ 5 day h/o altered mental status secondary to unclear etiology  AMS 2/2 Meningitis of Unclear Etiology Patient was sleepy when we saw him. After waking up, he was still less oriented than yesterday. He was able to recall his daughters but not able to recall that he was in the hospital or that he had a lumbar puncture before we visited  him on rounds today. Lumbar puncture came back, revealing a WBC of 208 with a differential of 98% lymphocytes and 2% monocytes. Meninginitis is likely the cause of his AMS. Gram stain negative. Based on the results so far, this seems more like a viral meninginitis. Will continue supportive care while we wait for the results of multiple CSF studies.  -- RPR pending -- cont ASA -- f/u blood cultures - NGTD -- lumbar puncture studies pending  HTN Blood pressure coming down. BP this morning was 135/67. -- Clonidine patch if unable to tolerate orals -- cont home Hydralazine 50 mg po TID,  -- cont carvedilol 25 mg po BID -- consider restarting Imdur and his blood pressures tolerate his carvedilol dosage. -- Hydralazine PRN BP > 185/110. -- cont to monitor   ESRD  -- cont HD w/ recommendation from Nephro  T2DM A1C 12.3 (05/08/16). Patient's blood glucose has been difficult to control. Will talk to diabetes management about a better regimen going forward.  -- SSI  Fluids: None Diet: Carb Modified DVT Prophylaxis: SQH Code Status: FULL  Dispo: Anticipated discharge in pending clinical course  Michael Dorsey, Medical Student 05/14/2016, 8:29 AM Pager: [phone]

## 2016-05-14 NOTE — Progress Notes (Signed)
Text Page to MD, regarding critical lab WBC 208.

## 2016-05-14 NOTE — Progress Notes (Signed)
The patient had his lumbar puncture this morning. After rounds we got notified that his white blood cell count was greater than 208. The differential is pending. Additional studies are also pending. This meningitis would explain the patient's altered mental status. We'll be aggressive in determining the etiology and treatment for this as more information is available.

## 2016-05-14 NOTE — Progress Notes (Signed)
[facility] Progress Note   Subjective: s/p LP this AM ; studies pending.    Vitals:   05/13/16 2148 05/14/16 0148 05/14/16 0512 05/14/16 1056  BP: (!) 155/85 (!) 150/72 135/67 138/73  Pulse: 73 68 65 72  Resp: 18 16 18 16   Temp: 97.6 F (36.4 C) 97.8 F (36.6 C)  97.9 F (36.6 C)  TempSrc: Oral Oral  Oral  SpO2: 100% 100% 100% 100%  Weight:   73 kg (160 lb 15 oz)   Height:        Inpatient medications: . aspirin  81 mg Oral Daily  . aspirin  300 mg Rectal Once  . calcium acetate  667 mg Oral TID WC  . cloNIDine  0.2 mg Transdermal Weekly  . doxercalciferol  6 mcg Intravenous Q T,Th,Sa-HD  . heparin  5,000 Units Subcutaneous Q8H  . hydrALAZINE  100 mg Oral Q8H  . insulin aspart  0-9 Units Subcutaneous TID WC  . insulin aspart protamine- aspart  15 Units Subcutaneous BID WC  . labetalol  300 mg Oral BID  . lidocaine      . multivitamin  1 tablet Oral QHS    sodium chloride, sodium chloride, acetaminophen **OR** acetaminophen (TYLENOL) oral liquid 160 mg/5 mL **OR** acetaminophen, alteplase, heparin, heparin, heparin, hydrALAZINE, lidocaine (PF), lidocaine-prilocaine, pentafluoroprop-tetrafluoroeth  Exam: GEN: NAD, sitting up eating breakfast NECK No jvd PULM Chest clear bilat Cv: RRR 2/6 murmur RUSB Abd soft ntnd Ext no LE edema LUA AVF+ bruit Neuro: alert, conversant, but doesn't remember where he grew up; doing tasks improperly.  No asterixis  Dialysis: MWF [facility]  4h  2/2 bath  P2  LUA AVF   76kg   Hep 7100 Venofer 50/wk Mircera 50 q 2, last 2.7 Hect 6 ug      Assessment/ Plan: 1. AMS/ aphasia - unclear cause, neuro/ primary team is working up, improving gradually. HTN vs thyroid vs subcortical leukoencephalopathy.  LP performed, studies pending.  Hopefully this will provide some insight.  I have also ordered an HIV on serum.  Consider a cryptococcal CSF antigen--OP was 14, making this less likely but if HIV from serum is positive would definitely  try to add this on. 2. ESRD HD mwf, next HD tomorrow, will hold heparin with HD 3. Volume - 2-3kg under dry wt, bp's stil high- doesn't appear vol overloaded, will need EDW adjustment at d/c 4. HTN - BP's still up, change Coreg > labetalol, cont clonidine/ hydral, increase hydralazine to 100 TID.   5. 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   Bufford Buttner MD [facility] pgr [phone] 05/14/2016, 11:11 AM     Recent Labs Lab 05/11/16 0656 05/12/16 0358 05/13/16 1244  NA 135 132* 133*  K 4.0 4.5 4.3  CL 99* 91* 96*  CO2 22 29 25   GLUCOSE 182* 247* 235*  BUN 50* 25* 43*  CREATININE 7.23* 4.82* 6.78*  CALCIUM 9.6 9.0 9.3  PHOS 5.4* 4.0 6.0*    Recent Labs Lab 05/08/16 1622  05/11/16 0656 05/12/16 0358 05/13/16 1244  AST 13*  --   --   --   --   ALT 15*  --   --   --   --   ALKPHOS 52  --   --   --   --   BILITOT 0.8  --   --   --   --   PROT 6.3*  --   --   --   --  ALBUMIN 3.2*  < > 3.2* 2.9* 2.8*  < > = values in this interval not displayed.  Recent Labs Lab 05/08/16 1622  05/10/16 0213 05/11/16 0655 05/13/16 1244  WBC 6.3  < > 6.6 5.9 5.2  NEUTROABS 5.0  --   --   --   --   HGB 9.9*  < > 11.2* 11.2* 9.5*  HCT 28.8*  < > 34.1* 33.8* 29.0*  MCV 86.2  < > 88.6 89.4 89.2  PLT 179  < > 209 196 171  < > = values in this interval not displayed. Iron/TIBC/Ferritin/ %Sat No results found for: IRON, TIBC, FERRITIN, IRONPCTSAT

## 2016-05-15 DIAGNOSIS — R011 Cardiac murmur, unspecified: Secondary | ICD-10-CM

## 2016-05-15 LAB — RENAL FUNCTION PANEL
ALBUMIN: 2.9 g/dL — AB (ref 3.5–5.0)
Anion gap: 9 (ref 5–15)
BUN: 41 mg/dL — AB (ref 6–20)
CHLORIDE: 96 mmol/L — AB (ref 101–111)
CO2: 26 mmol/L (ref 22–32)
Calcium: 8.9 mg/dL (ref 8.9–10.3)
Creatinine, Ser: 6.31 mg/dL — ABNORMAL HIGH (ref 0.61–1.24)
GFR, EST AFRICAN AMERICAN: 9 mL/min — AB (ref >60)
GFR, EST NON AFRICAN AMERICAN: 8 mL/min — AB (ref >60)
Glucose, Bld: 98 mg/dL (ref 65–99)
PHOSPHORUS: 5.5 mg/dL — AB (ref 2.5–4.6)
Potassium: 4.1 mmol/L (ref 3.5–5.1)
SODIUM: 131 mmol/L — AB (ref 135–145)

## 2016-05-15 LAB — CBC
HCT: 29.5 % — ABNORMAL LOW (ref 39.0–52.0)
Hemoglobin: 9.6 g/dL — ABNORMAL LOW (ref 13.0–17.0)
MCH: 29.4 pg (ref 26.0–34.0)
MCHC: 32.5 g/dL (ref 30.0–36.0)
MCV: 90.2 fL (ref 78.0–100.0)
PLATELETS: 156 10*3/uL (ref 150–400)
RBC: 3.27 MIL/uL — ABNORMAL LOW (ref 4.22–5.81)
RDW: 15.1 % (ref 11.5–15.5)
WBC: 6.3 10*3/uL (ref 4.0–10.5)

## 2016-05-15 LAB — GLUCOSE, CAPILLARY
GLUCOSE-CAPILLARY: 96 mg/dL (ref 65–99)
Glucose-Capillary: 90 mg/dL (ref 65–99)
Glucose-Capillary: 241 mg/dL — ABNORMAL HIGH (ref 65–99)
Glucose-Capillary: 217 mg/dL — ABNORMAL HIGH (ref 65–99)

## 2016-05-15 LAB — HEPATITIS C ANTIBODY: HCV Ab: 0.2 s/co ratio (ref 0.0–0.9)

## 2016-05-15 LAB — HERPES SIMPLEX VIRUS(HSV) DNA BY PCR
HSV 1 DNA: NEGATIVE
HSV 2 DNA: NEGATIVE

## 2016-05-15 LAB — PATHOLOGIST SMEAR REVIEW: Path Review: INCREASED

## 2016-05-15 LAB — HIV ANTIBODY (ROUTINE TESTING W REFLEX): HIV Screen 4th Generation wRfx: NONREACTIVE

## 2016-05-15 LAB — RPR: RPR Ser Ql: NONREACTIVE

## 2016-05-15 LAB — JC VIRUS, PCR CSF: JC VIRUS PCR (CSF): NEGATIVE

## 2016-05-15 MED ORDER — DARBEPOETIN ALFA 60 MCG/0.3ML IJ SOSY
60.0000 ug | PREFILLED_SYRINGE | INTRAMUSCULAR | Status: DC
  Administered 2016-05-15 – 2016-05-29 (×3): 60 ug via INTRAVENOUS
  Filled 2016-05-15 (×3): qty 0.3

## 2016-05-15 MED ORDER — DARBEPOETIN ALFA 60 MCG/0.3ML IJ SOSY
PREFILLED_SYRINGE | INTRAMUSCULAR | Status: AC
  Administered 2016-05-15: 60 ug via INTRAVENOUS
  Filled 2016-05-15: qty 0.3

## 2016-05-15 MED ORDER — FERRIC CITRATE 1 GM 210 MG(FE) PO TABS
210.0000 mg | ORAL_TABLET | Freq: Three times a day (TID) | ORAL | Status: DC
  Administered 2016-05-15 – 2016-05-31 (×35): 210 mg via ORAL
  Filled 2016-05-15 (×52): qty 1

## 2016-05-15 NOTE — Progress Notes (Signed)
[facility] Progress Note   Subjective: Serum RPR is reactive 1:1 with positive treponemal antibodies.  CSF VDRL pending (CSF RPR nonreactive but this is a less useful test than VDRL). LP returned with low glucose 208 WBCs with 98% lymphocytic predominance.  Pt has been started on acyclovir for empiric treatment of viral meningoencephalitis.    Vitals:   05/14/16 2126 05/15/16 0107 05/15/16 0500 05/15/16 0624  BP: (!) 171/89 130/69  139/70  Pulse: 68 64  60  Resp: 16 16  16   Temp: 97.7 F (36.5 C) 97.5 F (36.4 C)    TempSrc:  Axillary    SpO2: 100% 98%  99%  Weight:   73.8 kg (162 lb 11.2 oz)   Height:        Inpatient medications: . acyclovir  5 mg/kg Intravenous Q24H  . aspirin  81 mg Oral Daily  . aspirin  300 mg Rectal Once  . calcium acetate  667 mg Oral TID WC  . cloNIDine  0.2 mg Transdermal Weekly  . doxercalciferol  6 mcg Intravenous Q T,Th,Sa-HD  . heparin  5,000 Units Subcutaneous Q8H  . hydrALAZINE  100 mg Oral Q8H  . insulin aspart  0-9 Units Subcutaneous TID WC  . insulin aspart protamine- aspart  15 Units Subcutaneous Q supper  . labetalol  300 mg Oral BID  . multivitamin  1 tablet Oral QHS    sodium chloride, sodium chloride, acetaminophen **OR** acetaminophen (TYLENOL) oral liquid 160 mg/5 mL **OR** acetaminophen, alteplase, hydrALAZINE, lidocaine (PF), lidocaine-prilocaine, pentafluoroprop-tetrafluoroeth  Exam: GEN: NAD, sitting up eating breakfast NECK No jvd PULM Chest clear bilat Cv: RRR 2/6 murmur RUSB Abd soft ntnd Ext no LE edema LUA AVF+ bruit Neuro: alert, conversant, but doesn't remember where he grew up; doing tasks improperly.  No asterixis  Dialysis: MWF [facility]  4h  2/2 bath  P2  LUA AVF   76kg   Hep 7100 Venofer 50/wk Mircera 50 q 2, last 2.7 Hect 6 ug      Assessment/ Plan: 1. Encephalopathy: MRI 2/9 with ischemic changes in the L basal ganglia but no acute findings; also with severe advanced atrophy and diffuse white  matter disease--> these changes may be related to #2.   2. Meningoencephalitis: LP with low glucose, high protein, 208 WBCs with 98% lymphocytic predominance.  Gm stain without organisms but WBCs.  HSV, CMV, and VZV CSF PCRs pending.  CSF VDRL pending (but negative test wouldn't necessarily exclude diagnosis) CSF culture pending.  MTB CSF testing pending. CSF crypto antigen negative.  HIV negative.  Serum RPR 1:1 with positive treponemal antibodies.  ID is now following, greatly appreciate assistance.  Pt has been started on IV acyclovir (2/13-) which is appropriately dosed for ESRD.   It also appears that pt will need to be treated with PCN for neurosyphilis but will defer to Dr. Daiva Eves.  3. ESRD HD mwf, HD today, holding heparin s/p LP 4. Volume: EDW will likely be 73kg at discharge 5. HTN: BP's are overall improving, changed Coreg > labetalol, cont clonidine/ hydral, increase hydralazine to 100 TID.   6. 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     Bufford Buttner MD [facility] pgr [phone] 05/15/2016, 8:06 AM     Recent Labs Lab 05/11/16 0656 05/12/16 0358 05/13/16 1244  NA 135 132* 133*  K 4.0  4.5 4.3  CL 99* 91* 96*  CO2 22 29 25   GLUCOSE 182* 247* 235*  BUN 50* 25* 43*  CREATININE 7.23* 4.82* 6.78*  CALCIUM 9.6 9.0 9.3  PHOS 5.4* 4.0 6.0*    Recent Labs Lab 05/08/16 1622  05/11/16 0656 05/12/16 0358 05/13/16 1244  AST 13*  --   --   --   --   ALT 15*  --   --   --   --   ALKPHOS 52  --   --   --   --   BILITOT 0.8  --   --   --   --   PROT 6.3*  --   --   --   --   ALBUMIN 3.2*  < > 3.2* 2.9* 2.8*  < > = values in this interval not displayed.  Recent Labs Lab 05/08/16 1622  05/11/16 0655 05/13/16 1244 05/14/16 1148  WBC 6.3  < > 5.9 5.2 7.0  NEUTROABS 5.0  --   --   --   --   HGB 9.9*  < > 11.2* 9.5* 10.8*  HCT 28.8*  < >  33.8* 29.0* 33.6*  MCV 86.2  < > 89.4 89.2 91.3  PLT 179  < > 196 171 190  < > = values in this interval not displayed. Iron/TIBC/Ferritin/ %Sat No results found for: IRON, TIBC, FERRITIN, IRONPCTSAT

## 2016-05-15 NOTE — Progress Notes (Signed)
   Subjective: No acute vents overnight Patient was seen at dialysis today. Patient said he was not feeling good and said he felt tired and weak. He is only only oriented to self.   Objective:  Vital signs in last 24 hours: Vitals:   05/15/16 1030 05/15/16 1100 05/15/16 1115 05/15/16 1130  BP: (!) 94/35 (!) 95/53 (!) 85/40 (!) 103/53  Pulse: (!) 56 (!) 51 (!) 52 66  Resp:      Temp:      TempSrc:      SpO2:      Weight:      Height:        Physical Exam  Constitutional: He appears well-developed and well-nourished.  Cardiovascular: Normal rate and regular rhythm.   Holosystolic murmur heard loudest at the right upper sternal border.   Pulmonary/Chest: Effort normal.  Musculoskeletal: He exhibits no edema.  Neurological:  Only oriented to self.    Assessment/Plan:  Principal Problem:   Altered mental state Active Problems:   Type 2 diabetes mellitus (HCC)   Hypertension, renal disease   ESRD on hemodialysis (HCC)   Labile blood glucose   Anemia of chronic disease   Encephalopathy   Hyperglycemia   Elevated serum creatinine   Stroke (cerebrum) (HCC)   History of diabetes mellitus   Meningoencephalitis   71 y.o.malewith HTN, DM, ESRD on HD, who presents w/ 5 day h/o altered mental status secondary to unclear etiology  AMS 2/2 Meningoencephalitis of Unclear Etiology CSF studies thus far have revealed decreased glucose @ 36, increased total protein @, elevated WBC @ 208 w/ 98% lymphocytes. Gram stain is negative. Serum RPR was found to be weakly positive with a 1:1 titer. F/U T. Pall Abs were found to be positive. 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 We are awaiting CSF VDRL for confirmation. If positive we will treat patient with Pen G.  We are also concerned for viral etiology given CSF profile. If viral, it is most like HSV or VZV. We have started Acyclovir per ID recommendation and will wait for HSV and VZV results. We will continue to work closely  with ID to identify the etiology of patient's meningoencephalitis. Cryptococcal, Hep C, and HIV were found to be negative. MTB NAA, CMV, VDRL, HSV, VZV are all still pending.  -- cont ASA -- f/u blood cultures - NGTD -- f/u CSF cultures -- cont IV Acyclovir -- lumbar puncture studies pending  HTN Blood pressure coming down. BP this morning was 123/74 --Clonidine patch if unable to tolerate orals -- cont home Hydralazine 50 mg po TID,  -- cont carvedilol 25 mg po BID --consider restarting Imdur and his blood pressures tolerate his carvedilol dosage. --Hydralazine PRN BP >185/110. -- cont to monitor   ESRD  -- cont HD w/ recommendation from Nephro  T2DM A1C 12.3 (05/08/16). Patient's blood glucose has been difficult to control. Will talk to diabetes management about a better regimen going forward.  -- SSI  Fluids: None Diet: Carb Modified DVT Prophylaxis: SQH Code Status: FULL  Dispo: Anticipated discharge in pending clinical course  Caryn BeeJohn A Breckyn Troyer, Medical Student 05/15/2016, 11:58 AM Pager: [phone]

## 2016-05-15 NOTE — Progress Notes (Signed)
Physical Therapy Treatment Patient Details Name: Michael BookmanJohn Dorsey MRN: [id number] DOB: Apr 25, 1945 Today's Date: 05/15/2016    History of Present Illness 71 year old man with a history of end-stage renal disease requiring hemodialysis, diabetes, hypertension, and hyperlipidemia who presents with a five-day history of altered mental status. MRI on 2/9 showed no acute intracranial abnormality.     PT Comments    Pt lethargic this session, attempted to engage in seated therex and gait via demo and verbal cues but pt states "No, I am tired, I do not wish."  Pt agreeable only for therapist to assist back to bed.   Follow Up Recommendations  Supervision/Assistance - 24 hour;SNF     Equipment Recommendations  Rolling walker with 5" wheels    Recommendations for Other Services       Precautions / Restrictions Precautions Precautions: Fall Restrictions Weight Bearing Restrictions: No    Mobility  Bed Mobility Overal bed mobility: Needs Assistance Bed Mobility: Sit to Supine;Rolling Rolling: Modified independent (Device/Increase time)     Sit to supine: Supervision   General bed mobility comments: visual cues for sit>supine  Transfers Overall transfer level: Needs assistance Equipment used: 1 person hand held assist Transfers: Sit to/from [employer]GI CorporationStand;Stand Pivot Transfers Sit to Stand: Min assist Stand pivot transfers: Min assist       General transfer comment: min assist to initiate transfer   Ambulation/Gait                 Stairs            Wheelchair Mobility    Modified Rankin (Stroke Patients Only)       Balance                                    Cognition Arousal/Alertness: Lethargic Behavior During Therapy: Flat affect Overall Cognitive Status: Impaired/Different from baseline Area of Impairment: Following commands;Orientation       Following Commands: Follows one step commands inconsistently     Problem Solving: Slow  processing;Decreased initiation;Difficulty sequencing;Requires verbal cues;Requires tactile cues      Exercises      General Comments        Pertinent Vitals/Pain Pain Assessment: Faces Faces Pain Scale: No hurt    Home Living                      Prior Function            PT Goals (current goals can now be found in the care plan section) Acute Rehab PT Goals Patient Stated Goal: none stated Progress towards PT goals: Progressing toward goals    Frequency    Min 3X/week      PT Plan Current plan remains appropriate    Co-evaluation             End of Session   Activity Tolerance: Patient limited by fatigue Patient left: in bed;with bed alarm set     Time: [id number] PT Time Calculation (min) (ACUTE ONLY): 12 min  Charges:  $Therapeutic Activity: 8-22 mins                    G Codes:      Michael [facility] Michael [facility]sey 05/15/2016, 3:42 PM

## 2016-05-15 NOTE — Progress Notes (Signed)
SLP Cancellation Note  Patient Details Name: Michael BookmanJohn Dorsey MRN: [id number] DOB: [birth year]   Cancelled treatment:       Reason Eval/Treat Not Completed: Patient at procedure or test/unavailable (HD).   [name], [name] 05/15/2016, 10:25 AM  Maxcine HamLaura Paiewonsky, M.A. CCC-SLP [phone]

## 2016-05-15 NOTE — Progress Notes (Addendum)
Subjective:  " I feel weak"   Antibiotics:  Anti-infectives    Start     Dose/Rate Route Frequency Ordered Stop   05/14/16 2000  acyclovir (ZOVIRAX) 365 mg in dextrose 5 % 100 mL IVPB     5 mg/kg  73 kg 107.3 mL/hr over 60 Minutes Intravenous Every 24 hours 05/14/16 1826        Medications: Scheduled Meds: . acyclovir  5 mg/kg Intravenous Q24H  . aspirin  81 mg Oral Daily  . aspirin  300 mg Rectal Once  . calcium acetate  667 mg Oral TID WC  . cloNIDine  0.2 mg Transdermal Weekly  . darbepoetin (ARANESP) injection - DIALYSIS  60 mcg Intravenous Q Wed-HD  . doxercalciferol  6 mcg Intravenous Q T,Th,Sa-HD  . ferric citrate  210 mg Oral TID WC  . heparin  5,000 Units Subcutaneous Q8H  . hydrALAZINE  100 mg Oral Q8H  . insulin aspart  0-9 Units Subcutaneous TID WC  . insulin aspart protamine- aspart  15 Units Subcutaneous Q supper  . labetalol  300 mg Oral BID  . multivitamin  1 tablet Oral QHS   Continuous Infusions: PRN Meds:.sodium chloride, sodium chloride, acetaminophen **OR** acetaminophen (TYLENOL) oral liquid 160 mg/5 mL **OR** acetaminophen, alteplase, hydrALAZINE, lidocaine (PF), lidocaine-prilocaine, pentafluoroprop-tetrafluoroeth    Objective: Weight change: -3 lb 12 oz (-1.7 kg)  Intake/Output Summary (Last 24 hours) at 05/15/16 1201 Last data filed at 05/15/16 0800  Gross per 24 hour  Intake              700 ml  Output              310 ml  Net              390 ml   Blood pressure (!) 103/53, pulse 66, temperature 98 F (36.7 C), resp. rate 16, height 5\' 10"  (1.778 m), weight 160 lb 11.5 oz (72.9 kg), SpO2 99 %. Temp:  [97.5 F (36.4 C)-98 F (36.7 C)] 98 F (36.7 C) (02/14 0732) Pulse Rate:  [51-72] 66 (02/14 1130) Resp:  [16] 16 (02/14 0732) BP: (85-171)/(35-89) 103/53 (02/14 1130) SpO2:  [98 %-100 %] 99 % (02/14 0624) Weight:  [160 lb 11.5 oz (72.9 kg)-162 lb 11.2 oz (73.8 kg)] 160 lb 11.5 oz (72.9 kg) (02/14 0732)  Physical  Exam: General: Alert and awake, oriented person. HEENT: anicteric sclera,  EOMI CVS regular rate, normal r,  Systolic murmur throughout precordium Chest: clear to auscultation bilaterally, no wheezing, rales or rhonchi Abdomen: soft nontender, nondistended, normal bowel sounds, Extremities: no severe edema Skin: no rashes  Neuro: nonfocal  CBC:  CBC Latest Ref Rng & Units 05/15/2016 05/14/2016 05/13/2016  WBC 4.0 - 10.5 K/uL 6.3 7.0 5.2  Hemoglobin 13.0 - 17.0 g/dL 4.0(J) 10.8(L) 9.5(L)  Hematocrit 39.0 - 52.0 % 29.5(L) 33.6(L) 29.0(L)  Platelets 150 - 400 K/uL 156 190 171      BMET  Recent Labs  05/13/16 1244 05/15/16 0730  NA 133* 131*  K 4.3 4.1  CL 96* 96*  CO2 25 26  GLUCOSE 235* 98  BUN 43* 41*  CREATININE 6.78* 6.31*  CALCIUM 9.3 8.9     Liver Panel   Recent Labs  05/13/16 1244 05/15/16 0730  ALBUMIN 2.8* 2.9*       Sedimentation Rate No results for input(s): ESRSEDRATE in the last 72 hours. C-Reactive Protein No results for input(s): CRP in the last 72  hours.  Micro Results: Recent Results (from the past 720 hour(s))  Blood culture (routine x 2)     Status: None   Collection Time: 05/08/16  4:22 PM  Result Value Ref Range Status   Specimen Description BLOOD RIGHT ARM  Final   Special Requests BOTTLES DRAWN AEROBIC AND ANAEROBIC 5CC  Final   Culture NO GROWTH 5 DAYS  Final   Report Status 05/13/2016 FINAL  Final  Blood culture (routine x 2)     Status: None   Collection Time: 05/08/16  4:42 PM  Result Value Ref Range Status   Specimen Description BLOOD RIGHT HAND  Final   Special Requests IN PEDIATRIC BOTTLE 4CC  Final   Culture NO GROWTH 5 DAYS  Final   Report Status 05/13/2016 FINAL  Final  CSF culture     Status: None (Preliminary result)   Collection Time: 05/14/16  9:39 AM  Result Value Ref Range Status   Specimen Description CSF  Final   Special Requests TUBE 2  Final   Gram Stain   Final    WBC PRESENT, PREDOMINANTLY  MONONUCLEAR NO ORGANISMS SEEN CYTOSPIN SMEAR    Culture NO GROWTH 1 DAY  Final   Report Status PENDING  Incomplete    Studies/Results: Dg Fluoro Guide Lumbar Puncture  Result Date: 05/14/2016 CLINICAL DATA:  Altered mental status EXAM: DIAGNOSTIC LUMBAR PUNCTURE UNDER FLUOROSCOPIC GUIDANCE FLUOROSCOPY TIME:  Fluoroscopy Time:  1 minutes 36 second Radiation Exposure Index (if provided by the fluoroscopic device): Number of Acquired Spot Images: 0 PROCEDURE: Informed consent was obtained from the patient prior to the procedure, including potential complications of headache, allergy, and pain. With the patient prone, the lower back was prepped with Betadine. 1% Lidocaine was used for local anesthesia. Lumbar puncture was performed at the L4-5 level using a 20 gauge needle with return of slightly xanthochromic CSF with an opening pressure of 14 cm water. 8 ml of CSF were obtained for laboratory studies. The patient tolerated the procedure well and there were no apparent complications. IMPRESSION: Successful lumbar puncture using fluoroscopy. Spinal fluid is slightly xanthochromic. Electronically Signed   By: Marlan Palauharles  Clark M.D.   On: 05/14/2016 11:32      Assessment/Plan:  INTERVAL HISTORY:    RPR was 1:1 and NR in SERUM  Principal Problem:   Altered mental state Active Problems:   Type 2 diabetes mellitus (HCC)   Hypertension, renal disease   ESRD on hemodialysis (HCC)   Labile blood glucose   Anemia of chronic disease   Encephalopathy   Hyperglycemia   Elevated serum creatinine   Stroke (cerebrum) (HCC)   History of diabetes mellitus   Meningoencephalitis    Michael [facility] is a 71 y.o. male African with ESRD   Meningoencephalitis  #1 Meningoencephalitis:  I have spent about an hour with this patient including greater than 50% of time coordinating care with microbiology lab, primary team  To be clear the RPR that was read as positive low level one-to-one was done on the  12th the day before the lumbar puncture and was from serum. The second RPR that was read as nonreactive was done on the 13th but also run from serum.  Both of these tests are consistent with prior treated syphilis but not with active syphilis and certainly not neurosyphilis  Cryptococcal antigen on CSF was negative  Herpes simplex PCR's are pending   CMV and JC virus were sent to reference lab  VDRL was sent to reference lab  I asked the lab make a priority of running a varicella-zoster PCR on CSF since this sample was collected prior to initiation of acyclovir  Also asked that they run the MTB PCR  Since there are only a few milliliters of spinal fluid left I did not feel was worthwhile sending this for AFB culture.  I think if the patient fails to improve then he should  Have a large volume LP about 32-36 mL at least filling all 4 tubes with spinal fluid  I would send a tube for repeat cell count and differential, protein and glucose   We would then  send a dedicated 10-12mL to be centrifuged down and used for AFB stain and culture  I would  asimilarly use another 10-12  mL to centrifuge down and use  for fungal stain and culture AND HOLD FOR SIX weeks  If possible would save the supernatant from the centrifuge is so that we can use it to send for tests such as a histoplasma antigen Blastomyces antigen potentially Coccidioides antibodies  One could consider sending 8 mL for flow cytology but I don't think this is going to be very good healed or use of CSF  In the interim continue acyclovir  I will also send an HIV ultra quant RNA  #2 Murmur: possibly from AS or flow murmur        LOS: 7 days   Paulette Blanch Dam 05/15/2016, 12:01 PM

## 2016-05-15 NOTE — Progress Notes (Signed)
Patient transported to hemodialysis.  

## 2016-05-15 NOTE — Progress Notes (Signed)
Subjective:  No acute events overnight. Patient was seen in dialysis on rounds this morning. He stated that he felt tired and weak. He was not oriented to place or time. He was oriented to person. This is the first time he has had a complaint since being in the hospital setting.  Objective:  Vital signs in last 24 hours: Vitals:   05/15/16 1140 05/15/16 1142 05/15/16 1211 05/15/16 1245  BP: 118/60 123/62 123/74   Pulse: 67 65  76  Resp: 12  16   Temp: 97.6 F (36.4 C)  97.9 F (36.6 C)   TempSrc:   Oral   SpO2:   97%   Weight: 156 lb 15.5 oz (71.2 kg)     Height:       Physical Exam  Constitutional: He appears well-developed and well-nourished.  Patient was at dialysis when seen on examination today.  HENT:  Head: Normocephalic and atraumatic.  Cardiovascular: Normal rate and regular rhythm.   Murmur heard. Holosystolic murmur heard best at the right upper sternal border  Respiratory: Effort normal and breath sounds normal. No respiratory distress. He has no wheezes.  Musculoskeletal: He exhibits no edema.  Neurological: He is alert.  Patient alert to self but not place or time. He was asking questions today.     Assessment/Plan:  Principal Problem:   Altered mental state Active Problems:   Type 2 diabetes mellitus (HCC)   Hypertension, renal disease   ESRD on hemodialysis (HCC)   Labile blood glucose   Anemia of chronic disease   Encephalopathy   Hyperglycemia   Elevated serum creatinine   Stroke (cerebrum) (HCC)   History of diabetes mellitus   Meningoencephalitis   Michael Anane-Brobbeyis a 71 y.o.malewith HTN, DM, ESRD on HD, who presents w/ 5 day h/o altered mental status of unclear etiology.  # Meningoencephalitis  Patient's CSF profile as following: 208 white blood cells with a lymphocytic predominance, decreased glucose at 36 and increased protein at 241. Cryptococcal antigen negative. Exact etiological agent of the patient's meningeal  encephalitis is unclear at this time however I have concern for neurosyphilis given positive RPR and antibody. These were obtained from the blood and we will await the VDRL test from  the CSF. I'm also concerned for potential underlying viral etiology such as herpes simplex virus or zoster virus although MRI findings are not consistent with this typically seen in encephalitis. However, we've started the patient on IV antiviral medication and will await PCR for further identification. Infectious disease has also been consulted and is following the patient. We will follow their recommendations closely as we work to identify the underlying cause of the patient's meningoencephalitis. However, given the patient's finding of either prior syphilis infection or potential active infection I'm inclined to treat the patient in the next several days if his mental status does not improved with high-dose penicillin. - Continue aspirin - F/U blood cultures- currently no growth to date  - RPR- positive one-to-one serum, positive antibody. - Follow up VDRL CSF - Follow-up microbiology from lumbar puncture - Follow up ID recommendations - Continue IV acyclovir renally dosed - Consider treating with penicillin for potential neurosyphilis pending further results of VDRL test.   # IDDM Patient has labile and difficult to control glucose. I will discuss with diabetes management today about a better regimen going forward now that he is taking increased by mouth intake. - A1c 12.3 (05/08/16) - SSI-S  # Hypertension Blood pressure improving, most recent blood pressure  of 123/74.  - HD per Nephrology - Clonidine patch if unable to tolerate orals - Resume home Hydralazine 100 mg po TID - Labetalol 300 mg twice a day - Continue to hold him for given current blood pressure - Hydralazine PRN BP > 185/110.  ESRD on MWF HD: - HD this morning off schedule - Continue HD recommendations per nephrology  DVT/PE  prophylaxis: Heparin FEN/GI: Carb modified  Dispo: Anticipated discharge pending clinical course.  [name], MD 05/15/2016, 1:32 PM Pager: [phone]

## 2016-05-15 NOTE — Progress Notes (Signed)
CM spoke to patient yesterday about the plan at discharge. Pt stated he lives with his sister and has no other support. Stated his sister worked during the day.  CM called patients daughter, Nicole CellaDorothy, and spoke to her about the d/c plan. Dorothy, her sister and mother live locally. The patient was living with Nicole CellaDorothy and his wife prior to admission.  CIR assessed patient and felt patient to be more SNF or HH appropriate. CM discussed pt with CSW and they felt a LOG would not occur d/t patient walking over 150 feet.  CM discussed the patient going home with Nicole Cellaorothy. Nicole CellaDorothy was understanding of the rehabs decision and that of CSW. She is willing to take patient home. She would like to try and have therapy in the home if possible. CM will follow along with patients progress and see if he will qualify for any charity services.  Per Nicole Cellaorothy patient will need a 3 in 1 and walker for home. She states she works during the day but her mother would be home to assist her father. CM following.

## 2016-05-15 NOTE — Progress Notes (Signed)
Dialysis treatment completed.  2285 mL ultrafiltrated and net fluid removal 1785 mL.    Patient status unchanged. Lung sounds diminished to ausculation in all fields. Generalized edema. Cardiac: NSR.  Disconnected lines and removed needles.  Pressure held for 10 minutes and band aid/gauze dressing applied.  Report given to bedside RN, Nicholaus BloomKelley.

## 2016-05-15 NOTE — Progress Notes (Signed)
Patient arrived to unit per bed.  Reviewed treatment plan and this RN agrees.  Report received from bedside RN, Marchelle FolksAmanda.  Consent verified.  Patient Alert, oriented to self. Lung sounds diminished to ausculation in all fields. Generalized non pitting edema. Cardiac: NSR.  Prepped LUAVF with alcohol and cannulated with two 15 gauge needles.  Pulsation of blood noted.  Flushed access well with saline per protocol.  Connected and secured lines and initiated tx at 0740.  UF goal of 2500 mL and net fluid removal of 2000 mL.  Will continue to monitor.

## 2016-05-15 NOTE — Procedures (Signed)
Patient seen and examined on Hemodialysis. QB 400 UF goal 2.5 L.  No complaints and no problems reported.  Treatment adjusted as needed.  Michael ButtnerElizabeth Raiven Belizaire MD [facility] pgr [phone] 8:43 AM

## 2016-05-16 DIAGNOSIS — G039 Meningitis, unspecified: Secondary | ICD-10-CM

## 2016-05-16 DIAGNOSIS — R8299 Other abnormal findings in urine: Secondary | ICD-10-CM

## 2016-05-16 LAB — GLUCOSE, CAPILLARY
GLUCOSE-CAPILLARY: 138 mg/dL — AB (ref 65–99)
GLUCOSE-CAPILLARY: 64 mg/dL — AB (ref 65–99)
Glucose-Capillary: 216 mg/dL — ABNORMAL HIGH (ref 65–99)
Glucose-Capillary: 304 mg/dL — ABNORMAL HIGH (ref 65–99)
Glucose-Capillary: 175 mg/dL — ABNORMAL HIGH (ref 65–99)

## 2016-05-16 LAB — CMV DNA BY PCR, QUALITATIVE: CMV DNA QL PCR: NEGATIVE

## 2016-05-16 LAB — HIV-1 RNA QUANT-NO REFLEX-BLD: LOG10 HIV-1 RNA: UNDETERMINED {Log_copies}/mL

## 2016-05-16 MED ORDER — VANCOMYCIN HCL IN DEXTROSE 750-5 MG/150ML-% IV SOLN
750.0000 mg | INTRAVENOUS | Status: AC
  Administered 2016-05-17 – 2016-05-29 (×6): 750 mg via INTRAVENOUS
  Filled 2016-05-16 (×7): qty 150

## 2016-05-16 MED ORDER — DEXTROSE 50 % IV SOLN
INTRAVENOUS | Status: AC
  Administered 2016-05-16: 50 mL
  Filled 2016-05-16: qty 50

## 2016-05-16 MED ORDER — DOXERCALCIFEROL 4 MCG/2ML IV SOLN
6.0000 ug | INTRAVENOUS | Status: DC
  Administered 2016-05-17 – 2016-05-20 (×2): 6 ug via INTRAVENOUS
  Filled 2016-05-16 (×2): qty 4

## 2016-05-16 MED ORDER — DEXTROSE 5 % IV SOLN
1.0000 g | INTRAVENOUS | Status: DC
  Administered 2016-05-17 – 2016-05-18 (×3): 1 g via INTRAVENOUS
  Filled 2016-05-16 (×3): qty 1

## 2016-05-16 MED ORDER — VANCOMYCIN HCL 10 G IV SOLR
1500.0000 mg | Freq: Once | INTRAVENOUS | Status: AC
  Administered 2016-05-16: 1500 mg via INTRAVENOUS
  Filled 2016-05-16: qty 1500

## 2016-05-16 MED ORDER — PRO-STAT SUGAR FREE PO LIQD
30.0000 mL | Freq: Two times a day (BID) | ORAL | Status: DC
  Administered 2016-05-16 – 2016-05-28 (×16): 30 mL via ORAL
  Filled 2016-05-16 (×16): qty 30

## 2016-05-16 NOTE — Progress Notes (Signed)
Subjective:  Report of hematuria overnight. Urinalysis ordered. Patient complaining of no pain with urination.  When seen on rounds this morning the patient was sitting in a chair and was more interactive with the team. Although more interactive the patient remains oriented to person and place but not time. He has difficulty recalling other information that is asked of him. He is still not at his baseline. He continues to have word finding difficulties and perseverates often during the interview. Objective:  Vital signs in last 24 hours: Vitals:   05/16/16 0058 05/16/16 0500 05/16/16 0520 05/16/16 0952  BP: 135/62  138/61 116/70  Pulse: 65  69 65  Resp: 20  18 20   Temp: 97.5 F (36.4 C)  97.3 F (36.3 C)   TempSrc: Oral  Oral   SpO2: 99%  100% 100%  Weight:  156 lb 15.5 oz (71.2 kg)    Height:       Physical Exam  Constitutional: He appears well-developed and well-nourished.  Patient sitting up in a chair resting comfortably  HENT:  Head: Normocephalic and atraumatic.  Eyes: Pupils are equal, round, and reactive to light.  No Argyle Robertson pupil  Cardiovascular: Normal rate and regular rhythm.   Murmur heard. Holosystolic murmur heard best at the right upper sternal border  Respiratory: Effort normal and breath sounds normal. No respiratory distress. He has no wheezes.  Musculoskeletal: He exhibits no edema.  Neurological: He is alert.  Patient alert, oriented to self and location. Not oriented to time. Perseverates often during the interview.     Assessment/Plan:  Principal Problem:   Altered mental state Active Problems:   Type 2 diabetes mellitus (HCC)   Hypertension, renal disease   ESRD on hemodialysis (HCC)   Labile blood glucose   Anemia of chronic disease   Encephalopathy   Hyperglycemia   Elevated serum creatinine   Stroke (cerebrum) (HCC)   History of diabetes mellitus   Meningoencephalitis   Michael Anane-Brobbeyis a 71 y.o.malewith HTN,  DM, ESRD on HD, who presents w/ 5 day h/o altered mental status of unclear etiology.  # Meningoencephalitis  Patient's CSF profile as following: 208 white blood cells with a lymphocytic predominance, decreased glucose at 36 and increased protein at 241. Cryptococcal antigen negative. HSV 1 and 2 negative. JC virus negative. CMV and VDRL pending. The exact etiology of the patient's meningeal encephalitis is unclear at this time. This could've represent a viral infection or an atypical bacteria such as mycobacterium tuberculosis or Treponema pallidum. This could also be a medication reaction and we will go through his medications to see if these are associated with an aseptic form of meningitis. We will continue to stay broad in our differential diagnosis however I'm still concerned that the patient may have syphilis that will require treatment at some time. RPR positive and antibody positive. We will await VDRL results of the CSF. - Continue aspirin - F/U blood cultures- currently no growth to date  - RPR- positive one-to-one serum, positive antibody. May ask lab for serial dilutions? - Follow up VDRL CSF - Follow up CMV from CSF - Follow-up microbiology from lumbar puncture - Follow up ID recommendations - Continue IV acyclovir renally dosed - Consider treating with penicillin for potential neurosyphilis pending further results of VDRL test.  # Hematuria? There is a concern for hematuria overnight. Nephrology is aware. Urinalysis was collected and we'll await results. The differential diagnosis for this could include a crystal nephropathy secondary to acyclovir or other  underlying renal pathology. The patient is asymptomatic. He has no pain. We'll follow up on urinalysis. -- Follow-up on urinalysis  # IDDM Patient has labile and difficult to control glucose. I will discuss with diabetes management today about a better regimen going forward now that he is taking increased by mouth intake. - A1c  12.3 (05/08/16) - SSI-S  # Hypertension Blood pressure improving, most recent blood pressure of 123/74.  - HD per Nephrology - Clonidine patch if unable to tolerate orals - Resume home Hydralazine 100 mg po TID - Labetalol 300 mg twice a day - Hydralazine PRN BP > 185/110.  ESRD on MWF HD: - HD this morning off schedule - Continue HD recommendations per nephrology  DVT/PE prophylaxis: Heparin FEN/GI: Carb modified  Dispo: Anticipated discharge pending clinical course.  Thomasene LotJames Adalbert Alberto, MD 05/16/2016, 10:43 AM Pager: [phone]

## 2016-05-16 NOTE — Progress Notes (Signed)
Patient urine color turned to red. Patient had urinary frequency since last night. Patient been pulling/ retracting his Penis everytime use the urinal. N.P. Informed. Will continue to monitor.

## 2016-05-16 NOTE — Progress Notes (Signed)
Results for Caesar BookmanNANE-BROBBEY, Milo (MRN [id number]) as of 05/16/2016 12:45  Ref. Range 05/15/2016 16:43 05/15/2016 21:05 05/16/2016 06:07 05/16/2016 12:05  Glucose-Capillary Latest Ref Range: 65 - 99 mg/dL 409241 (H) 811217 (H) 914216 (H) 304 (H)  Noted that blood sugars continue to be elevated. Recommend changing 70/30 insulin dosage to 15 units BID and continuing the Novolog SENSITIVE correction scale TID.  Patient takes 70/30 20 units BID at home. Will continue to monitor blood sugars while in the hospital. Smith MinceKendra Rashaud Ybarbo RN BSN CDE

## 2016-05-16 NOTE — Progress Notes (Signed)
Speech Language Pathology Treatment: Cognitive-Linquistic  Patient Details Name: Michael BookmanJohn Dorsey MRN: [id number] DOB: 07-30-1945 Today's Date: 05/16/2016 Time: [id number] SLP Time Calculation (min) (ACUTE ONLY): 17 min  Assessment / Plan / Recommendation Clinical Impression  Pt oriented to self, situation and place initially, but became confused during session and required min-mod verbal cues to follow simple 1-2 step directives. Pt appeared to understand English intermittently during session and ask appropriate questions, but short-term memory deficits noted as pt would then ask frequent questions re: "where are my children?" and "where am I?" after being oriented to place previously in session.  Family was not present to provide information re: prior cognition.  Nursing stated he "repeats questions" reinforcing STM deficits/decreased storage of new information; will continue to assess while pt in acute setting for cognition/language.   HPI HPI: Pt is a 71 y.o. male with a PMH of end-stage renal disease requiring hemodialysis, diabetes, hypertension, and hyperlipidemia who presented with a five-day history of altered mental status. He was performing all of his activities of daily living at baseline. After his dialysis session on Friday he began refusing medications and then refused to attend dialysis on Monday. He has not been eating well. CT of head showed age indeterminate LEFT basal ganglia lacunar infarct, old appearing LEFT thalamus lacunar infarct. Moderate to severe global brain atrophy and moderate to severe chronic small vessel ischemic disease. CXR was unremarkable.      SLP Plan  Goals updated     Recommendations   TBD                Follow up Recommendations: Other (TBD) Plan: Goals updated                      Michael Dorsey,PAT, M.S., CCC-SLP 05/16/2016, 3:56 PM

## 2016-05-16 NOTE — Progress Notes (Signed)
Pharmacy Antibiotic Note  Michael Dorsey is a 71 y.o. male admitted on 05/08/2016 with sepsis.  Pharmacy has been consulted for cefepime and vancomycin dosing. Patient has history of ESRD on HD MWF with next planned HD scheduled for 2/16.  Plan: Vancomycin 1500mg  IV once followed by 750mg  qHD Cefepime 1g IV q24h Monitor culture data, renal function and [facility] Target pre-HD vancomycin level 15-25 mcg/mL  Height: 5\' 10"  (177.8 cm) Weight: 156 lb 15.5 oz (71.2 kg) IBW/kg (Calculated) : 73  Temp (24hrs), Avg:95.6 F (35.3 C), Min:93.4 F (34.1 C), Max:97.5 F (36.4 C)   Recent Labs Lab 05/10/16 0213 05/11/16 0655 05/11/16 0656 05/12/16 0358 05/13/16 1244 05/14/16 1148 05/15/16 0730 05/15/16 0745  WBC 6.6 5.9  --   --  5.2 7.0  --  6.3  CREATININE 5.21*  --  7.23* 4.82* 6.78*  --  6.31*  --     Estimated Creatinine Clearance: 10.7 mL/min (by C-G formula based on SCr of 6.31 mg/dL (H)).    No Known Allergies   Michael Dorsey, PharmD, BCPS Clinical Pharmacist [phone] 05/16/2016 9:34 PM

## 2016-05-16 NOTE — Progress Notes (Signed)
Occupational Therapy Treatment Patient Details Name: Michael Dorsey MRN: [id number] DOB: 01-26-1946 Today's Date: 05/16/2016    History of present illness 71 year old man with a history of end-stage renal disease requiring hemodialysis, diabetes, hypertension, and hyperlipidemia who presents with a five-day history of altered mental status. MRI on 2/9 showed no acute intracranial abnormality.    OT comments  Pt participated in ADL retraining session today with focus on functional mobility and transfers in preparation for increased participation in ADL's as well as cognitive retraining. Updated recommendation to SNF Rehab vs home w/ HHOT and 24/hr family assist depending on pt progress and family ability to assist.   Follow Up Recommendations  SNF;Supervision/Assistance - 24 hour;Other (comment) (vs home w/ family - depending on pt progress.,)    Equipment Recommendations       Recommendations for Other Services      Precautions / Restrictions Precautions Precautions: Fall Restrictions Weight Bearing Restrictions: No       Mobility Bed Mobility Overal bed mobility: Needs Assistance Bed Mobility: Supine to Sit     Supine to sit: Modified independent (Device/Increase time)        Transfers Overall transfer level: Needs assistance Equipment used: Rolling walker (2 wheeled) Transfers: Sit to/from [employer] Sit to Stand: Min guard Stand pivot transfers: Min guard       General transfer comment: Min guard to initiate transfer    Balance Overall balance assessment: Needs assistance Sitting-balance support: No upper extremity supported;Feet supported Sitting balance-Leahy Scale: Good     Standing balance support: Bilateral upper extremity supported Standing balance-Leahy Scale: Fair                     ADL Overall ADL's : Needs assistance/impaired     Grooming: Wash/dry hands;Set up;Supervision/safety;Sitting   Upper Body Bathing:   (Pt reports that he already bathed this morning)               Toilet Transfer: Minimal assistance;BSC;Ambulation;RW (Simulated toiolet transfer from EOB to chair using RW)           Functional mobility during ADLs: Minimal assistance;Rolling walker;Cueing for safety;Cueing for sequencing General ADL Comments: Pt seen for ADL retraining session today. He declined bathing stating that he already bathed and brushed his teeth this morning. He was agreeable to getting OOB bed & transeferring to recliner - for dimulated toilet transfer and functional mobility. He was noted to be slower processing information today than he was when last seen. It took him increased time to process information and then required vc/tc's for task initiation (for bed mobility, sit EOB, sit to stand, ambulation w/ RW etc). Pt correctly stated month and "hospital" when asked if he knew where we were, unable to state time, date, year.      Vision  Difficult to assess                   Perception     Praxis      Cognition   Behavior During Therapy: Flat affect Overall Cognitive Status: Impaired/Different from baseline Area of Impairment: Following commands;Orientation;Problem solving        Following Commands: Follows one step commands inconsistently     Problem Solving: Slow processing;Decreased initiation;Difficulty sequencing;Requires verbal cues;Requires tactile cues General Comments: did not remember how long he has been here.  States he has not seen his family    Extremity/Trunk Assessment  Exercises     Shoulder Instructions       General Comments      Pertinent Vitals/ Pain       Pain Assessment: No/denies pain Pain Score: 0-No pain Faces Pain Scale: No hurt  Home Living                                          Prior Functioning/Environment              Frequency  Min 2X/week        Progress Toward Goals  OT Goals(current  goals can now be found in the care plan section)  Progress towards OT goals: Progressing toward goals  Acute Rehab OT Goals Patient Stated Goal: None stated  Plan Discharge plan needs to be updated;Discharge plan remains appropriate    Co-evaluation                 End of Session Equipment Utilized During Treatment: Gait belt;Rolling walker   Activity Tolerance Patient tolerated treatment well   Patient Left in chair;with call bell/phone within reach;with chair alarm set   Nurse Communication          Time: [id number] OT Time Calculation (min): 19 min  Charges: OT General Charges $OT Visit: 1 Procedure OT Treatments $Therapeutic Activity: 8-22 mins  Barnhill, Amy Beth Dixon, OTR/L 05/16/2016, 10:53 AM

## 2016-05-16 NOTE — Progress Notes (Signed)
[facility] Progress Note   Subjective: Had some reported hematuria overnight.  Is perseverative this AM  Vitals:   05/15/16 2105 05/16/16 0058 05/16/16 0500 05/16/16 0520  BP: 131/65 135/62  138/61  Pulse: 69 65  69  Resp: 18 20  18   Temp: 97.6 F (36.4 C) 97.5 F (36.4 C)  97.3 F (36.3 C)  TempSrc: Oral Oral  Oral  SpO2: 99% 99%  100%  Weight:   71.2 kg (156 lb 15.5 oz)   Height:        Inpatient medications: . acyclovir  5 mg/kg Intravenous Q24H  . aspirin  81 mg Oral Daily  . aspirin  300 mg Rectal Once  . calcium acetate  667 mg Oral TID WC  . cloNIDine  0.2 mg Transdermal Weekly  . darbepoetin (ARANESP) injection - DIALYSIS  60 mcg Intravenous Q Wed-HD  . doxercalciferol  6 mcg Intravenous Q T,Th,Sa-HD  . ferric citrate  210 mg Oral TID WC  . heparin  5,000 Units Subcutaneous Q8H  . hydrALAZINE  100 mg Oral Q8H  . insulin aspart  0-9 Units Subcutaneous TID WC  . insulin aspart protamine- aspart  15 Units Subcutaneous Q supper  . labetalol  300 mg Oral BID  . multivitamin  1 tablet Oral QHS    acetaminophen **OR** acetaminophen (TYLENOL) oral liquid 160 mg/5 mL **OR** acetaminophen, hydrALAZINE  Exam: GEN: NAD, sitting in chair NECK No jvd PULM Chest clear bilat Cv: RRR 2/6 murmur RUSB Abd soft ntnd Ext 1+ LE edema LUA AVF+ bruit Neuro: perseverative this AM  Dialysis: MWF AF  4h  2/2 bath  P2  LUA AVF   76kg   Hep 7100 Venofer 50/wk Mircera 50 q 2, last 2.7 Hect 6 ug      Assessment/ Plan: 1. Encephalopathy: MRI 2/9 with ischemic changes in the L basal ganglia but no acute findings; also with severe advanced atrophy and diffuse white matter disease.  He most likely has an underlying dementia which is exacerbated by #2.   2. Meningoencephalitis: LP with low glucose, high protein, 208 WBCs with 98% lymphocytic predominance.  Gm stain without organisms but WBCs.  HSV 1 and 2 PCR negative, CMV, and VZV CSF PCRs pending.  CSF VDRL pending  (but negative test wouldn't necessarily exclude diagnosis) CSF culture pending.  MTB CSF testing pending. CSF crypto antigen negative.  HIV negative.  Serum RPR 1:1 with positive treponemal antibodies.  ID is now following, greatly appreciate assistance.  Pt has been started on IV acyclovir (2/13-) which is appropriately dosed for ESRD.  Await pending studies. 3. Hematuria: checking UA and culture if appropriate.  If acyclovir crystals have precipitated in urine, could cause hematuria as well; acquired cystic disease is prevalent in ESRD population and consider renal US to evaluate this too 4. ESRD HD mwf, next HD planned for tomorrow 12/16 5. Volume: EDW will likely change at discharge 6. HTN: BP's are overall improving, changed Coreg > labetalol, cont clonidine/ hydral, increased hydralazine to 100 TID.   7. 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   9. Nutrition: albumin 2.9, prostat   Michael Buttner MD [facility] pgr [phone] 05/16/2016, 9:46 AM     Recent Labs Lab 05/12/16 0358 05/13/16 1244 05/15/16 0730  NA 132* 133* 131*  K 4.5 4.3 4.1  CL 91* 96* 96*  CO2 29 25 26   GLUCOSE 247* 235* 98  BUN 25* 43* 41*  CREATININE 4.82* 6.78* 6.31*  CALCIUM 9.0 9.3 8.9  PHOS 4.0 6.0* 5.5*    Recent Labs Lab 05/12/16 0358 05/13/16 1244 05/15/16 0730  ALBUMIN 2.9* 2.8* 2.9*    Recent Labs Lab 05/13/16 1244 05/14/16 1148 05/15/16 0745  WBC 5.2 7.0 6.3  HGB 9.5* 10.8* 9.6*  HCT 29.0* 33.6* 29.5*  MCV 89.2 91.3 90.2  PLT 171 190 156   Iron/TIBC/Ferritin/ %Sat No results found for: IRON, TIBC, FERRITIN, IRONPCTSAT

## 2016-05-16 NOTE — Progress Notes (Signed)
Patient DOES NOT REQUIRE DROPLET PRECAUTIONS

## 2016-05-16 NOTE — Progress Notes (Addendum)
At 2000 pt's routine vitals were taken and pt ambulated weakly with 1 assist to the bathroom. Oral and axillary temps were showing 468F and pt's skin was very cold. Rectal temp showed a core body temp of 94.68F. Recheck temps on different thermometers showed the same result. Bp=113/73, pulse=63, pt denies pain or discomfort but he is very withdrawn and almost nonverbal which the tech reported is more marked outside his usual baseline. Blood sugar=62. Rapid Response RN happened to be on the floor and she came to bedside. One amp of D50 given which brought sugar up to 175 and internal medicine residency service notified. MD Marinda ElkStrelow and Lynnae Sandhoffruang came to bedside to assess. Extra blankets put on pt. They want to place pt in a bear hugger warming blanket for core warming but we do not have that capability on this floor. Transfer orders placed to go to a progressive unit, waiting on bed placement. STAT blood cultures, MRI, labs, and new IV antibiotics ordered. At 2230 pt looked more alert and skin felt slightly warmer than earlier so I rechecked core temp both rectally and orally for comparison but both are still registering about 16F on multiple different thermometers. Will continue to monitor.

## 2016-05-16 NOTE — Progress Notes (Signed)
Subjective:  No specific complaints   Antibiotics:  Anti-infectives    Start     Dose/Rate Route Frequency Ordered Stop   05/14/16 2000  acyclovir (ZOVIRAX) 365 mg in dextrose 5 % 100 mL IVPB     5 mg/kg  73 kg 107.3 mL/hr over 60 Minutes Intravenous Every 24 hours 05/14/16 1826        Medications: Scheduled Meds: . acyclovir  5 mg/kg Intravenous Q24H  . aspirin  81 mg Oral Daily  . aspirin  300 mg Rectal Once  . calcium acetate  667 mg Oral TID WC  . cloNIDine  0.2 mg Transdermal Weekly  . darbepoetin (ARANESP) injection - DIALYSIS  60 mcg Intravenous Q Wed-HD  . [START ON 05/17/2016] doxercalciferol  6 mcg Intravenous Q M,W,F-HD  . feeding supplement (PRO-STAT SUGAR FREE 64)  30 mL Oral BID  . ferric citrate  210 mg Oral TID WC  . heparin  5,000 Units Subcutaneous Q8H  . hydrALAZINE  100 mg Oral Q8H  . insulin aspart  0-9 Units Subcutaneous TID WC  . insulin aspart protamine- aspart  15 Units Subcutaneous Q supper  . labetalol  300 mg Oral BID  . multivitamin  1 tablet Oral QHS   Continuous Infusions: PRN Meds:.acetaminophen **OR** acetaminophen (TYLENOL) oral liquid 160 mg/5 mL **OR** acetaminophen, hydrALAZINE    Objective: Weight change: -1 lb 15.7 oz (-0.9 kg)  Intake/Output Summary (Last 24 hours) at 05/16/16 1708 Last data filed at 05/16/16 0520  Gross per 24 hour  Intake            347.3 ml  Output              700 ml  Net           -352.7 ml   Blood pressure 116/70, pulse 65, temperature 97.3 F (36.3 C), temperature source Oral, resp. rate 20, height 5\' 10"  (1.778 m), weight 156 lb 15.5 oz (71.2 kg), SpO2 100 %. Temp:  [97.3 F (36.3 C)-97.6 F (36.4 C)] 97.3 F (36.3 C) (02/15 0520) Pulse Rate:  [65-69] 65 (02/15 0952) Resp:  [16-20] 20 (02/15 0952) BP: (116-138)/(61-70) 116/70 (02/15 0952) SpO2:  [99 %-100 %] 100 % (02/15 0952) Weight:  [156 lb 15.5 oz (71.2 kg)] 156 lb 15.5 oz (71.2 kg) (02/15 0500)  Physical Exam: General: Alert  and awake, oriented person, can tell me is from [place] originally HEENT: anicteric sclera,  EOMI CVS regular rate, normal r,  Systolic murmur throughout precordium Chest: clear to auscultation bilaterally, no wheezing, rales or rhonchi Abdomen: soft nontender, nondistended, normal bowel sounds, Extremities: no severe edema Skin: no rashes Neuro: nonfocal  CBC:  CBC Latest Ref Rng & Units 05/15/2016 05/14/2016 05/13/2016  WBC 4.0 - 10.5 K/uL 6.3 7.0 5.2  Hemoglobin 13.0 - 17.0 g/dL 1.6(X9.6(L) 10.8(L) 9.5(L)  Hematocrit 39.0 - 52.0 % 29.5(L) 33.6(L) 29.0(L)  Platelets 150 - 400 K/uL 156 190 171      BMET  Recent Labs  05/15/16 0730  NA 131*  K 4.1  CL 96*  CO2 26  GLUCOSE 98  BUN 41*  CREATININE 6.31*  CALCIUM 8.9     Liver Panel   Recent Labs  05/15/16 0730  ALBUMIN 2.9*       Sedimentation Rate No results for input(s): ESRSEDRATE in the last 72 hours. C-Reactive Protein No results for input(s): CRP in the last 72 hours.  Micro Results: Recent Results (from the past  720 hour(s))  Blood culture (routine x 2)     Status: None   Collection Time: 05/08/16  4:22 PM  Result Value Ref Range Status   Specimen Description BLOOD RIGHT ARM  Final   Special Requests BOTTLES DRAWN AEROBIC AND ANAEROBIC 5CC  Final   Culture NO GROWTH 5 DAYS  Final   Report Status 05/13/2016 FINAL  Final  Blood culture (routine x 2)     Status: None   Collection Time: 05/08/16  4:42 PM  Result Value Ref Range Status   Specimen Description BLOOD RIGHT HAND  Final   Special Requests IN PEDIATRIC BOTTLE 4CC  Final   Culture NO GROWTH 5 DAYS  Final   Report Status 05/13/2016 FINAL  Final  CSF culture     Status: None (Preliminary result)   Collection Time: 05/14/16  9:39 AM  Result Value Ref Range Status   Specimen Description CSF  Final   Special Requests TUBE 2  Final   Gram Stain   Final    WBC PRESENT, PREDOMINANTLY MONONUCLEAR NO ORGANISMS SEEN CYTOSPIN SMEAR    Culture NO  GROWTH 2 DAYS  Final   Report Status PENDING  Incomplete  Culture, blood (Routine X 2) w Reflex to ID Panel     Status: None (Preliminary result)   Collection Time: 05/14/16  2:03 PM  Result Value Ref Range Status   Specimen Description BLOOD RIGHT HAND  Final   Special Requests IN PEDIATRIC BOTTLE 4CC  Final   Culture NO GROWTH 2 DAYS  Final   Report Status PENDING  Incomplete  Culture, blood (Routine X 2) w Reflex to ID Panel     Status: None (Preliminary result)   Collection Time: 05/14/16  2:03 PM  Result Value Ref Range Status   Specimen Description BLOOD RIGHT ARM  Final   Special Requests BOTTLES DRAWN AEROBIC AND ANAEROBIC 5CC  Final   Culture NO GROWTH 2 DAYS  Final   Report Status PENDING  Incomplete    Studies/Results: No results found.    Assessment/Plan:  INTERVAL HISTORY:    RPR was 1:1 and NR in SERUM  Principal Problem:   Meningoencephalitis Active Problems:   Type 2 diabetes mellitus (HCC)   Hypertension, renal disease   ESRD on hemodialysis (HCC)   Labile blood glucose   Anemia of chronic disease   Hyperglycemia   Stroke (cerebrum) (HCC)   History of diabetes mellitus    Michael Dorsey is a 71 y.o. male African with ESRD   Meningoencephalitis  #1 Meningoencephalitis:  I'll his VDRL from spinal fluid is pending his serum RPR is not consistent with neurosyphilis. His crypt coccal antigen from CSF is negative his cultures and CSF are negative his HSV 1 and 2 as well as CMV and JC virus PCR's are negative. His HIV test was negative as is his HIV RNA ultrquant.   I believe his varicella-zoster virus PCR has been canceled, and I also believe the same happened with the MTB PCR.  Since he is not improving I would recommend repeat MRI of the brain without contrast and close review of this film and prior film with neuroradiology.  I would also obtain a large volume LP about 32-36 mL at least filling all 4 tubes with spinal fluid  I would send a tube  for repeat cell count and differential, protein and glucose  We would then  send a dedicated 10mL to be centrifuged down and used for AFB stain and culture  I would  asimilarly use another 10-mL to centrifuge down and use  for fungal stain and culture AND HOLD FOR SIX weeks  If possible would save the supernatant from the above 2 centrifuge her seizures is so that we can use it along with remaining a CSF to send for:  MTB PCR  VZV PCR  Enterovirus PCR   Arbovirus panel on CSF and serum--> state lab  (can also send our [place] Nile Virus antibody that will be done at our lab)  Histoplasma antigen   Blastomyces antigen  Coccidioides antibodies by Complement fixation and Immune diffusion  I would send CSF and serum for anti- NMDA antibodies (would discuss with Neurology re other tests they might want for autoimmune encephalitis workup)  Consider CSF ACE level  Consider Human Herpes virus 6, 7 by PCR  Hopefully this CSF profile shows improvement such as reduced cell count and higher glucose than on his initial lumbar puncture  I am skeptical that the acyclovir is really needed but I would like to see with a repeat MRI shows. Certainly he does not have herpes type PCR   #2 Murmur: possibly from AS or flow murmur        LOS: 8 days   Acey Lav 05/16/2016, 5:08 PM

## 2016-05-16 NOTE — Progress Notes (Signed)
Subjective: Patient had episode of hematuria overnight. Patient denies any pain with urination. Patient was interactive today. He asked questions about his care today. He is still only oriented to person and place. He is not as his baseline. Patient is having a hard time recalling information when asked of him.   Objective:  Vital signs in last 24 hours: Vitals:   05/16/16 0058 05/16/16 0500 05/16/16 0520 05/16/16 0952  BP: 135/62  138/61 116/70  Pulse: 65  69 65  Resp: 20  18 20   Temp: 97.5 F (36.4 C)  97.3 F (36.3 C)   TempSrc: Oral  Oral   SpO2: 99%  100% 100%  Weight:  71.2 kg (156 lb 15.5 oz)    Height:       Physical Exam  Constitutional: He appears well-developed and well-nourished.  Eyes:  No Argyll Robertson pupil present  Cardiovascular: Normal rate and regular rhythm.   Holosystolic murmur heard loudest at right upper sternal border  Pulmonary/Chest: Effort normal and breath sounds normal.  Abdominal: Soft. Bowel sounds are normal.  Musculoskeletal: He exhibits no edema.  Neurological:  Patient is alert and oriented to person and place, but not time. Patient is interactive today.    Assessment/Plan:  Principal Problem:   Meningoencephalitis Active Problems:   Type 2 diabetes mellitus (HCC)   Hypertension, renal disease   ESRD on hemodialysis (HCC)   Labile blood glucose   Anemia of chronic disease   Hyperglycemia   Stroke (cerebrum) (HCC)   History of diabetes mellitus   71 y.o.malewith HTN, DM, ESRD on HD, who presents w/ 5 day h/o altered mental status secondary to unclear etiology  AMS 2/2 Meningoencephalitis of Unclear Etiology CSF studies thus far have revealed decreased glucose @ 36, increased total protein @, elevated WBC @ 208 w/ 98% lymphocytes. Gram stain is negative. Serum RPR was found to be weakly positive with a 1:1 titer. F/U T. Pall Abs were found to be positive. 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 We are  awaiting CSF VDRL for confirmation. If positive we will treat patient with Pen G.  We are also concerned for viral etiology given CSF profile. If viral, it is most like HSV or VZV. We have started Acyclovir per ID recommendation and will wait for HSV and VZV results. We will continue to work closely with ID to identify the etiology of patient's meningoencephalitis. Cryptococcal, Hep C, CMV, HSV and HIV were found to be negative. VDRL still pending.  -- cont ASA -- f/u blood cultures - NGTD -- f/u CSF cultures -- cont IV Acyclovir -- VDRL pending  Hematuria Patient had episode of hematuria overnight. Patient denies any pain with urination. Current differential includes crystal nephropathy 2/2 Acyclovir vs. other underlying renal pathology. Ordered a UA to investigate etiology. -- f/u UA  HTN BP this morning was 138/61 --Clonidine patch if unable to tolerate orals -- cont home Hydralazine 50 mg po TID,  -- cont carvedilol 25 mg po BID --consider restarting Imdur and his blood pressures tolerate his carvedilol dosage. --Hydralazine PRN BP >185/110. -- cont to monitor   ESRD  -- cont HD w/ recommendation from Nephro  T2DM A1C 12.3 (05/08/16). Patient's blood glucose has been difficult to control. Will talk to diabetes management about a better regimen going forward.  -- SSI  Fluids: None Diet: Carb Modified DVT Prophylaxis: SQH Code Status: FULL  Dispo: Anticipated discharge pending clinical course  Caryn BeeJohn A Areli Frary, Medical Student 05/16/2016, 2:46  PM Pager: [phone]

## 2016-05-16 NOTE — Clinical Social Work Note (Signed)
CMRN following pt for discharge planning needs to home. CSW signing off as no further needs identified. Please reconsult, if need arises.   Corlis HoveJeneya Duff Pozzi, LCSWA, LCASA Clinical Social Work [phone]

## 2016-05-17 ENCOUNTER — Inpatient Hospital Stay (HOSPITAL_COMMUNITY): Payer: Medicaid Other

## 2016-05-17 DIAGNOSIS — R680 Hypothermia, not associated with low environmental temperature: Secondary | ICD-10-CM

## 2016-05-17 DIAGNOSIS — T68XXXA Hypothermia, initial encounter: Secondary | ICD-10-CM

## 2016-05-17 DIAGNOSIS — R7989 Other specified abnormal findings of blood chemistry: Secondary | ICD-10-CM

## 2016-05-17 DIAGNOSIS — G9340 Encephalopathy, unspecified: Secondary | ICD-10-CM

## 2016-05-17 DIAGNOSIS — E11649 Type 2 diabetes mellitus with hypoglycemia without coma: Secondary | ICD-10-CM

## 2016-05-17 DIAGNOSIS — E162 Hypoglycemia, unspecified: Secondary | ICD-10-CM

## 2016-05-17 LAB — BLOOD CULTURE ID PANEL (REFLEXED)
ACINETOBACTER BAUMANNII: NOT DETECTED
CANDIDA PARAPSILOSIS: NOT DETECTED
CANDIDA TROPICALIS: NOT DETECTED
Candida albicans: NOT DETECTED
Candida glabrata: NOT DETECTED
Candida krusei: NOT DETECTED
Enterobacter cloacae complex: NOT DETECTED
Enterobacteriaceae species: NOT DETECTED
Enterococcus species: NOT DETECTED
Escherichia coli: NOT DETECTED
HAEMOPHILUS INFLUENZAE: NOT DETECTED
KLEBSIELLA OXYTOCA: NOT DETECTED
KLEBSIELLA PNEUMONIAE: NOT DETECTED
Listeria monocytogenes: NOT DETECTED
METHICILLIN RESISTANCE: DETECTED — AB
NEISSERIA MENINGITIDIS: NOT DETECTED
Proteus species: NOT DETECTED
Pseudomonas aeruginosa: NOT DETECTED
SERRATIA MARCESCENS: NOT DETECTED
STAPHYLOCOCCUS AUREUS BCID: DETECTED — AB
STREPTOCOCCUS SPECIES: NOT DETECTED
Staphylococcus species: DETECTED — AB
Streptococcus agalactiae: NOT DETECTED
Streptococcus pneumoniae: NOT DETECTED
Streptococcus pyogenes: NOT DETECTED

## 2016-05-17 LAB — T4, FREE: Free T4: 1.08 ng/dL (ref 0.61–1.12)

## 2016-05-17 LAB — URINALYSIS, ROUTINE W REFLEX MICROSCOPIC
BILIRUBIN URINE: NEGATIVE
Glucose, UA: 150 mg/dL — AB
KETONES UR: NEGATIVE mg/dL
NITRITE: NEGATIVE
Protein, ur: 100 mg/dL — AB
Specific Gravity, Urine: 1.005 (ref 1.005–1.030)
pH: 9 — ABNORMAL HIGH (ref 5.0–8.0)

## 2016-05-17 LAB — RENAL FUNCTION PANEL
Albumin: 2.8 g/dL — ABNORMAL LOW (ref 3.5–5.0)
Anion gap: 13 (ref 5–15)
BUN: 43 mg/dL — AB (ref 6–20)
CALCIUM: 9.2 mg/dL (ref 8.9–10.3)
CHLORIDE: 94 mmol/L — AB (ref 101–111)
CO2: 26 mmol/L (ref 22–32)
CREATININE: 6.1 mg/dL — AB (ref 0.61–1.24)
GFR, EST AFRICAN AMERICAN: 10 mL/min — AB (ref >60)
GFR, EST NON AFRICAN AMERICAN: 8 mL/min — AB (ref >60)
Glucose, Bld: 107 mg/dL — ABNORMAL HIGH (ref 65–99)
Phosphorus: 4.3 mg/dL (ref 2.5–4.6)
Potassium: 3.8 mmol/L (ref 3.5–5.1)
SODIUM: 133 mmol/L — AB (ref 135–145)

## 2016-05-17 LAB — CORTISOL: CORTISOL PLASMA: 10.7 ug/dL

## 2016-05-17 LAB — CBC
HCT: 29.6 % — ABNORMAL LOW (ref 39.0–52.0)
Hemoglobin: 9.6 g/dL — ABNORMAL LOW (ref 13.0–17.0)
MCH: 29.2 pg (ref 26.0–34.0)
MCHC: 32.4 g/dL (ref 30.0–36.0)
MCV: 90 fL (ref 78.0–100.0)
PLATELETS: 165 10*3/uL (ref 150–400)
RBC: 3.29 MIL/uL — AB (ref 4.22–5.81)
RDW: 15.5 % (ref 11.5–15.5)
WBC: 10.7 10*3/uL — AB (ref 4.0–10.5)

## 2016-05-17 LAB — CSF CULTURE

## 2016-05-17 LAB — GLUCOSE, CAPILLARY
GLUCOSE-CAPILLARY: 98 mg/dL (ref 65–99)
GLUCOSE-CAPILLARY: 218 mg/dL — AB (ref 65–99)
GLUCOSE-CAPILLARY: 121 mg/dL — AB (ref 65–99)
Glucose-Capillary: 110 mg/dL — ABNORMAL HIGH (ref 65–99)
Glucose-Capillary: 130 mg/dL — ABNORMAL HIGH (ref 65–99)
Glucose-Capillary: 51 mg/dL — ABNORMAL LOW (ref 65–99)
Glucose-Capillary: 95 mg/dL (ref 65–99)

## 2016-05-17 LAB — CSF CULTURE W GRAM STAIN: Culture: NO GROWTH

## 2016-05-17 LAB — MRSA PCR SCREENING: MRSA BY PCR: NEGATIVE

## 2016-05-17 LAB — TSH: TSH: 0.756 u[IU]/mL (ref 0.350–4.500)

## 2016-05-17 MED ORDER — DEXTROSE 50 % IV SOLN
INTRAVENOUS | Status: AC
  Administered 2016-05-17: 50 mL
  Filled 2016-05-17: qty 50

## 2016-05-17 MED ORDER — INSULIN DETEMIR 100 UNIT/ML ~~LOC~~ SOLN
8.0000 [IU] | Freq: Every day | SUBCUTANEOUS | Status: DC
  Administered 2016-05-17: 8 [IU] via SUBCUTANEOUS
  Filled 2016-05-17 (×2): qty 0.08

## 2016-05-17 MED ORDER — DOXERCALCIFEROL 4 MCG/2ML IV SOLN
INTRAVENOUS | Status: AC
  Administered 2016-05-17: 6 ug via INTRAVENOUS
  Filled 2016-05-17: qty 4

## 2016-05-17 MED ORDER — VANCOMYCIN HCL IN DEXTROSE 750-5 MG/150ML-% IV SOLN
INTRAVENOUS | Status: AC
  Administered 2016-05-17: 750 mg via INTRAVENOUS
  Filled 2016-05-17: qty 150

## 2016-05-17 MED ORDER — SODIUM CHLORIDE 0.9 % IV BOLUS (SEPSIS)
200.0000 mL | INTRAVENOUS | Status: DC
  Administered 2016-05-17 – 2016-05-19 (×3): 200 mL via INTRAVENOUS

## 2016-05-17 NOTE — Progress Notes (Signed)
Subjective:  Overnight the patient became hypothermic, more disoriented and his blood sugar dropped to 62. Temperature was low as 93.4. ID was consulted during the event and patient was started on broad spectrum antibiotics for concern of possible sepsis.   Patient was in dialysis when we saw him this morning. He was disoriented and less participative than usual. He was only alert to self.   Objective:  Vital signs in last 24 hours: Vitals:   05/17/16 0930 05/17/16 1000 05/17/16 1030 05/17/16 1100  BP: (!) 146/78 (!) 152/88 (!) 141/94 (!) 158/90  Pulse: 83 83 81 78  Resp:      Temp:      TempSrc:      SpO2:      Weight:      Height:       Physical Exam  Constitutional: He appears well-developed and well-nourished.  Cardiovascular: Normal rate and regular rhythm.   Holosystolic murmur heard loudest at right upper sternal border  Pulmonary/Chest: Effort normal and breath sounds normal.  Musculoskeletal: He exhibits no edema.  Neurological:  Alert and oriented only to self. Not responsive as usual.     Assessment/Plan:  Principal Problem:   Meningoencephalitis Active Problems:   Type 2 diabetes mellitus (HCC)   Hypertension, renal disease   ESRD on hemodialysis (HCC)   Labile blood glucose   Anemia of chronic disease   Hyperglycemia   Stroke (cerebrum) (HCC)   History of diabetes mellitus   Aseptic meningitis  71 y.o.malewith HTN, DM, ESRD on HD, who presents w/ 5 day h/o altered mental status secondary to unclear etiology  AMS 2/2 Meningoencephalitis of Unclear Etiology CSF studies thus far have revealed decreased glucose @ 36, increased total protein @, elevated WBC @208  w/98% lymphocytes. Gram stain is negative. Serum RPR was found to be weakly positive with a 1:1 titer. F/U T. Pall Abs were found to be positive. 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 We are awaiting CSF VDRL for confirmation. If positive we will treat patient with Pen G. We are  also concerned for viral etiology given CSF profile. If viral, it is most like HSV or VZV. We have started Acyclovir per ID recommendation and will wait for HSV and VZV results. We will continue to work closely with ID to identify the etiology of patient's meningoencephalitis. Cryptococcal, Hep C, CMV, HSV and HIV were found to be negative. VDRL still pending. ID recommended a large volume LP for several more studies.  -- cont ASA -- f/u blood cultures - NGTD -- f/u CSF cultures -- cont IV Acyclovir -- VDRL pending -- large volume LP today -- f/u LP studies  Hypothermia Overnight the patient became hypothermic, more disoriented and his blood sugar dropped to 62. Temperature was low as 93.4. ID was consulted during the event and patient was started on broad spectrum antibiotics for concern of possible sepsis. Does not meet sepsis criteria currently, but patient seems more confused from inpatient baseline. It is possible that this could be from thyroid dysfunction as studies were abnormal last time. Will repeat studies again. Will keep patient in bear hugger to keep warm. Will cont broad spectrum antibiotics.  -- ordered TSH, free T3 & T4 --cont Vancomycin and Cefepime  Hematuria Patient had episode of hematuria overnight 2/14. Patient denied any pain with urination. Current differential includes crystal nephropathy 2/2 Acyclovir vs. other underlying renal pathology. Ordered a UA to investigate etiology. -- f/u UA  HTN --Clonidine patch if unable to  tolerate orals -- cont home Hydralazine 50 mg po TID,  -- cont carvedilol 25 mg po BID --consider restarting Imdur and his blood pressures tolerate his carvedilol dosage. --Hydralazine PRN BP >185/110. -- cont to monitor   ESRD  Patient had HD today -- cont HD w/ recommendation from Nephro  T2DM A1C 12.3 (05/08/16). Patient's blood glucose has been difficult to control. Will talk to diabetes management about a better regimen going  forward.  -- SSI  Fluids: None Diet:Carb Modified DVT Prophylaxis: SQH Code Status: FULL  Dispo: Anticipated discharge pending clinical course.   Caryn BeeJohn A Benn Tarver, Medical Student 05/17/2016, 12:09 PM Pager: [phone]

## 2016-05-17 NOTE — Procedures (Signed)
Patient seen and examined on Hemodialysis. QB 400 UF goal 1 L.  He is less responsive and has possible sepsis.   UF goal has been reduced today.    Treatment adjusted as needed.  Bufford ButtnerElizabeth Rachele Lamaster MD [facility] pgr [phone] 7:46 AM

## 2016-05-17 NOTE — Progress Notes (Signed)
Subjective:  Overnight the patient became more altered and was hypothermic. It appears that his temperature got as low as 93.4 and he was put on a bear hugger and transferred to the stepdown unit. During this event infectious diseases was consulted and recommended starting the patient on broad-spectrum antibiotics with vancomycin and cefepime. During this episode of hypothermia the patient's vital signs remained stable. He was not tachycardic. He was satting appropriately on room air. In addition to being started on broad-spectrum antibiotics blood cultures were also obtained which we'll follow-up.  When seen on rounds this morning the patient was at dialysis. He was alert and only oriented to self. He was not oriented to place or time. He did seem slightly more altered than he has on previous examinations. He was unable to participate further in the interview. Objective:  Vital signs in last 24 hours: Vitals:   05/17/16 1030 05/17/16 1100 05/17/16 1130 05/17/16 1223  BP: (!) 141/94 (!) 158/90 (!) 157/94 (!) 162/78  Pulse: 81 78 72 73  Resp:   19 18  Temp:   97.5 F (36.4 C) (!) 96.8 F (36 C)  TempSrc:   Axillary Axillary  SpO2:   100% 100%  Weight:   156 lb 12 oz (71.1 kg)   Height:       Physical Exam  Constitutional: He appears well-developed and well-nourished.  Patient evaluated at dialysis.  HENT:  Head: Normocephalic and atraumatic.  Eyes: Pupils are equal, round, and reactive to light.  Cardiovascular: Normal rate and regular rhythm.   Murmur heard. Holosystolic murmur heard best at the right upper sternal border  Respiratory: Effort normal and breath sounds normal. No respiratory distress. He has no wheezes.  Musculoskeletal: He exhibits no edema.  Neurological: He is alert.  Patient alert to self but not time or place. He seemed slower in his cognition and more altered than his examination yesterday morning.      Assessment/Plan:  Principal Problem:  Meningoencephalitis Active Problems:   Type 2 diabetes mellitus (HCC)   Hypertension, renal disease   ESRD on hemodialysis (HCC)   Labile blood glucose   Anemia of chronic disease   Hyperglycemia   Stroke (cerebrum) (HCC)   History of diabetes mellitus   Aseptic meningitis   Michael Anane-Brobbeyis a 71 y.o.malewith HTN, DM, ESRD on HD, who presents w/ 5 day h/o altered mental status of unclear etiology.  # Meningoencephalitis  Patient's CSF profile as following: 208 white blood cells with a lymphocytic predominance, decreased glucose at 36 and increased protein at 241. The exact etiology of the patient's meningoencephalitis remains unclear at this time. It appears he acutely decompensated overnight and was more altered and hypothermic. He was started on broad-spectrum antibiotics with cefepime and vancomycin. Infectious disease has recommended a repeat lumbar puncture today with a large volume of CSF removed. Infectious disease has recommended several studies which we have placed in the order. We have consulted interventional radiology to have this procedure done and it appears this will happen today. For now we will continue the patient on acyclovir, cefepime and vancomycin.  - Continue aspirin - F/U blood cultures- currently no growth to date  - RPR- positive one-to-one serum, positive antibody. May ask lab for serial dilutions? - Follow up VDRL CSF - HSV 1 and 2, CMV, JC virus negative - For nephrology patient should have a lumbar puncture today.  # Hypothermia Patient became hypothermic overnight. During this episode his vital signs remained stable. He was not  tachycardic or hypotensive. He was satting appropriately on room air. He was placed on a bear hugger and transferred to the stepdown unit for further monitoring. Currently he remains borderline hypothermic. He was started on broad-spectrum antibiotics for potential septic picture. He is not meeting additional septic criteria  but given his mental status and new onset hypothermia this was an appropriate measure overnight. We will continue to monitor. Etiology of his hypothermia is unclear although I do suspect he may have some thyroid dysfunction that might have worsened from prior. For now we'll repeat thyroid function tests, keep the patient on the bear hugger and continue with broad-spectrum antibiotics. -- TSH, free T4, free T3 -- Continue vancomycin and cefepime -- Appreciate infectious disease recommendations   # Hematuria Per nephrology note the patient's urinalysis showed essentially no red blood cells and small blood. Does not appear that he has a urinary tract infection. We will continue to monitor. -- Continue to monitor  # IDDM Patient has labile and difficult to control glucose. I will discuss with diabetes management today about a better regimen going forward now that he is taking increased by mouth intake. - A1c 12.3 (05/08/16) - SSI-S  # Hypertension - HD per Nephrology - Clonidine patch if unable to tolerate orals - Resume home Hydralazine 100 mg po TID - Labetalol 300 mg twice a day - Hydralazine PRN BP > 185/110.  ESRD on MWF HD: The patient had hemodialysis today. He is back on his normal schedule. - Continue HD recommendations per nephrology  DVT/PE prophylaxis: Heparin FEN/GI: Carb modified  Dispo: Anticipated discharge pending clinical course.  Thomasene LotJames Joelynn Dust, MD 05/17/2016, 71, 12:24 PM Pager: [phone]

## 2016-05-17 NOTE — Progress Notes (Signed)
[facility] Progress Note   Subjective: was hypothermic overnight last night.  Blood glucose also a little low.  In [employer]  Repeat blood cultures drawn, IV antibiotics started.    Vitals:   05/17/16 0300 05/17/16 0345 05/17/16 0459 05/17/16 0720  BP: 117/65   140/82  Pulse: 71   79  Resp: 11   (!) 23  Temp: (!) 95.8 F (35.4 C)  97.4 F (36.3 C) 97.5 F (36.4 C)  TempSrc: Rectal  Oral Axillary  SpO2: 96%   100%  Weight:  73.7 kg (162 lb 7.7 oz)  72.1 kg (158 lb 15.2 oz)  Height:        Inpatient medications: . acyclovir  5 mg/kg Intravenous Q24H  . aspirin  81 mg Oral Daily  . aspirin  300 mg Rectal Once  . calcium acetate  667 mg Oral TID WC  . ceFEPime (MAXIPIME) IV  1 g Intravenous Q24H  . cloNIDine  0.2 mg Transdermal Weekly  . darbepoetin (ARANESP) injection - DIALYSIS  60 mcg Intravenous Q Wed-HD  . doxercalciferol  6 mcg Intravenous Q M,W,F-HD  . feeding supplement (PRO-STAT SUGAR FREE 64)  30 mL Oral BID  . ferric citrate  210 mg Oral TID WC  . heparin  5,000 Units Subcutaneous Q8H  . hydrALAZINE  100 mg Oral Q8H  . insulin aspart  0-9 Units Subcutaneous TID WC  . insulin aspart protamine- aspart  15 Units Subcutaneous Q supper  . labetalol  300 mg Oral BID  . multivitamin  1 tablet Oral QHS  . vancomycin  750 mg Intravenous Q M,W,F-HD    acetaminophen **OR** acetaminophen (TYLENOL) oral liquid 160 mg/5 mL **OR** acetaminophen, hydrALAZINE  Exam: GEN: lying in bed, less responsive than yesterday. NECK No jvd PULM Chest clear bilat Cv: RRR 2/6 murmur RUSB Abd soft ntnd Ext 1+ LE edema LUA AVF+ bruit Neuro: sleeping, moves all extremities but isn't very interactive this AM  Dialysis: MWF AF  4h  2/2 bath  P2  LUA AVF   76kg   Hep 7100 Venofer 50/wk Mircera 50 q 2, last 2.7 Hect 6 ug      Assessment/ Plan:  1. Possible sepsis: hypothermic, less responsive.  Cultures, IV vanc/ cefepime, bair hugger.  Per  primary. 2. Encephalopathy: MRI 2/9 with ischemic changes in the L basal ganglia but no acute findings; also with severe advanced atrophy and diffuse white matter disease.  He most likely has an underlying dementia which is exacerbated by #2.   3. Meningoencephalitis: LP with low glucose, high protein, 208 WBCs with 98% lymphocytic predominance.  Gm stain without organisms but WBCs.  HSV 1 and 2 PCR negative, CMV, and VZV CSF PCRs pending (VZV may have been cancelled).  CSF VDRL pending (but negative test wouldn't necessarily exclude diagnosis) CSF culture pending.  MTB CSF testing also cancelled CSF crypto antigen negative.  HIV negative.  Serum RPR 1:1 with positive treponemal antibodies.  ID is now following, greatly appreciate assistance.  Pt has been started on IV acyclovir (2/13-) which is appropriately dosed for ESRD.  He needs a larger volume LP per ID- please let us know when this occurs so we can appropriately hold intradialytic heparin 4. Hematuria: UA with essentially no RBCs and small blood.  Doesn't look like a UTI.  Continue to monitor. 5. ESRD HD mwf, next HD today 12/16.  Still holding heparin 6. Volume: EDW will likely change at discharge 7. HTN: BP's are overall  improving, changed Coreg > labetalol, cont clonidine/ hydral, increased hydralazine to 100 TID.   8. Anemia of CKD: Hgb 11--> 9.6, restart Aranesp 60 q Wednesday (first dose 12/14-) 9. 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   10. Nutrition: albumin 2.9, prostat   Bufford ButtnerElizabeth Samiyyah Moffa MD [facility] pgr [phone] 05/17/2016, 7:39 AM     Recent Labs Lab 05/12/16 0358 05/13/16 1244 05/15/16 0730  NA 132* 133* 131*  K 4.5 4.3 4.1  CL 91* 96* 96*  CO2 29 25 26   GLUCOSE 247* 235* 98  BUN 25* 43* 41*  CREATININE 4.82* 6.78* 6.31*  CALCIUM 9.0 9.3 8.9  PHOS 4.0 6.0* 5.5*    Recent Labs Lab 05/12/16 0358 05/13/16 1244 05/15/16 0730  ALBUMIN  2.9* 2.8* 2.9*    Recent Labs Lab 05/13/16 1244 05/14/16 1148 05/15/16 0745  WBC 5.2 7.0 6.3  HGB 9.5* 10.8* 9.6*  HCT 29.0* 33.6* 29.5*  MCV 89.2 91.3 90.2  PLT 171 190 156   Iron/TIBC/Ferritin/ %Sat No results found for: IRON, TIBC, FERRITIN, IRONPCTSAT

## 2016-05-17 NOTE — Progress Notes (Signed)
Subjective:  Patient confused and talking to his daughter and his wife they tell me that he had had some improvement during his stay but then it worsened yesterday.   Antibiotics:  Anti-infectives    Start     Dose/Rate Route Frequency Ordered Stop   05/17/16 1200  vancomycin (VANCOCIN) IVPB 750 mg/150 ml premix     750 mg 150 mL/hr over 60 Minutes Intravenous Every M-W-F (Hemodialysis) 05/16/16 2138     05/16/16 2200  ceFEPIme (MAXIPIME) 1 g in dextrose 5 % 50 mL IVPB     1 g 100 mL/hr over 30 Minutes Intravenous Every 24 hours 05/16/16 2136     05/16/16 2200  vancomycin (VANCOCIN) 1,500 mg in sodium chloride 0.9 % 500 mL IVPB     1,500 mg 250 mL/hr over 120 Minutes Intravenous  Once 05/16/16 2137 05/17/16 0035   05/14/16 2000  acyclovir (ZOVIRAX) 365 mg in dextrose 5 % 100 mL IVPB     5 mg/kg  73 kg 107.3 mL/hr over 60 Minutes Intravenous Every 24 hours 05/14/16 1826        Medications: Scheduled Meds: . acyclovir  5 mg/kg Intravenous Q24H  . aspirin  81 mg Oral Daily  . aspirin  300 mg Rectal Once  . calcium acetate  667 mg Oral TID WC  . ceFEPime (MAXIPIME) IV  1 g Intravenous Q24H  . darbepoetin (ARANESP) injection - DIALYSIS  60 mcg Intravenous Q Wed-HD  . doxercalciferol  6 mcg Intravenous Q M,W,F-HD  . feeding supplement (PRO-STAT SUGAR FREE 64)  30 mL Oral BID  . ferric citrate  210 mg Oral TID WC  . heparin  5,000 Units Subcutaneous Q8H  . hydrALAZINE  100 mg Oral Q8H  . insulin aspart  0-9 Units Subcutaneous TID WC  . insulin detemir  8 Units Subcutaneous QHS  . labetalol  300 mg Oral BID  . multivitamin  1 tablet Oral QHS  . sodium chloride  200 mL Intravenous Q24H  . vancomycin  750 mg Intravenous Q M,W,F-HD   Continuous Infusions: PRN Meds:.acetaminophen **OR** acetaminophen (TYLENOL) oral liquid 160 mg/5 mL **OR** acetaminophen, hydrALAZINE    Objective: Weight change: 1 lb 12.2 oz (0.8 kg)  Intake/Output Summary (Last 24 hours) at  05/17/16 1702 Last data filed at 05/17/16 1130  Gross per 24 hour  Intake               50 ml  Output             1000 ml  Net             -950 ml   Blood pressure (!) 162/78, pulse 70, temperature (!) 96.8 F (36 C), temperature source Axillary, resp. rate 18, height 5\' 10"  (1.778 m), weight 156 lb 12 oz (71.1 kg), SpO2 100 %. Temp:  [93.4 F (34.1 C)-97.5 F (36.4 C)] 96.8 F (36 C) (02/16 1223) Pulse Rate:  [63-97] 70 (02/16 1240) Resp:  [11-23] 18 (02/16 1223) BP: (111-162)/(65-94) 162/78 (02/16 1240) SpO2:  [96 %-100 %] 100 % (02/16 1223) Weight:  [156 lb 12 oz (71.1 kg)-162 lb 7.7 oz (73.7 kg)] 156 lb 12 oz (71.1 kg) (02/16 1130)  Physical Exam: General: Alert and awake, oriented person is eating lunch fed by his wife HEENT: anicteric sclera,  EOMI CVS regular rate, normal r,  Systolic murmur throughout precordium Chest: clear to auscultation bilaterally, no wheezing, rales or rhonchi Abdomen: soft nontender, nondistended,  normal bowel sounds, Extremities: no severe edema Skin: no rashes Neuro: nonfocal  CBC:  CBC Latest Ref Rng & Units 05/17/2016 05/15/2016 05/14/2016  WBC 4.0 - 10.5 K/uL 10.7(H) 6.3 7.0  Hemoglobin 13.0 - 17.0 g/dL 0.9(W) 1.1(B) 10.8(L)  Hematocrit 39.0 - 52.0 % 29.6(L) 29.5(L) 33.6(L)  Platelets 150 - 400 K/uL 165 156 190      BMET  Recent Labs  05/15/16 0730 05/17/16 0727  NA 131* 133*  K 4.1 3.8  CL 96* 94*  CO2 26 26  GLUCOSE 98 107*  BUN 41* 43*  CREATININE 6.31* 6.10*  CALCIUM 8.9 9.2     Liver Panel   Recent Labs  05/15/16 0730 05/17/16 0727  ALBUMIN 2.9* 2.8*       Sedimentation Rate No results for input(s): ESRSEDRATE in the last 72 hours. C-Reactive Protein No results for input(s): CRP in the last 72 hours.  Micro Results: Recent Results (from the past 720 hour(s))  Blood culture (routine x 2)     Status: None   Collection Time: 05/08/16  4:22 PM  Result Value Ref Range Status   Specimen Description BLOOD  RIGHT ARM  Final   Special Requests BOTTLES DRAWN AEROBIC AND ANAEROBIC 5CC  Final   Culture NO GROWTH 5 DAYS  Final   Report Status 05/13/2016 FINAL  Final  Blood culture (routine x 2)     Status: None   Collection Time: 05/08/16  4:42 PM  Result Value Ref Range Status   Specimen Description BLOOD RIGHT HAND  Final   Special Requests IN PEDIATRIC BOTTLE 4CC  Final   Culture NO GROWTH 5 DAYS  Final   Report Status 05/13/2016 FINAL  Final  CSF culture     Status: None   Collection Time: 05/14/16  9:39 AM  Result Value Ref Range Status   Specimen Description CSF  Final   Special Requests TUBE 2  Final   Gram Stain   Final    WBC PRESENT, PREDOMINANTLY MONONUCLEAR NO ORGANISMS SEEN CYTOSPIN SMEAR    Culture NO GROWTH 3 DAYS  Final   Report Status 05/17/2016 FINAL  Final  Culture, blood (Routine X 2) w Reflex to ID Panel     Status: None (Preliminary result)   Collection Time: 05/14/16  2:03 PM  Result Value Ref Range Status   Specimen Description BLOOD RIGHT HAND  Final   Special Requests IN PEDIATRIC BOTTLE 4CC  Final   Culture NO GROWTH 3 DAYS  Final   Report Status PENDING  Incomplete  Culture, blood (Routine X 2) w Reflex to ID Panel     Status: None (Preliminary result)   Collection Time: 05/14/16  2:03 PM  Result Value Ref Range Status   Specimen Description BLOOD RIGHT ARM  Final   Special Requests BOTTLES DRAWN AEROBIC AND ANAEROBIC 5CC  Final   Culture NO GROWTH 3 DAYS  Final   Report Status PENDING  Incomplete  Culture, blood (Routine X 2) w Reflex to ID Panel     Status: None (Preliminary result)   Collection Time: 05/16/16  9:36 PM  Result Value Ref Range Status   Specimen Description BLOOD RIGHT HAND  Final   Special Requests BOTTLES DRAWN AEROBIC AND ANAEROBIC 10CC EACH  Final   Culture NO GROWTH < 24 HOURS  Final   Report Status PENDING  Incomplete  Culture, blood (Routine X 2) w Reflex to ID Panel     Status: None (Preliminary result)   Collection Time:  05/16/16  9:40 PM  Result Value Ref Range Status   Specimen Description BLOOD RIGHT ANTECUBITAL  Final   Special Requests BOTTLES DRAWN AEROBIC AND ANAEROBIC 5CC EACH  Final   Culture NO GROWTH < 24 HOURS  Final   Report Status PENDING  Incomplete  MRSA PCR Screening     Status: None   Collection Time: 05/17/16  1:36 AM  Result Value Ref Range Status   MRSA by PCR NEGATIVE NEGATIVE Final    Comment:        The GeneXpert MRSA Assay (FDA approved for NASAL specimens only), is one component of a comprehensive MRSA colonization surveillance program. It is not intended to diagnose MRSA infection nor to guide or monitor treatment for MRSA infections.     Studies/Results: Mr Brain Wo Contrast  Result Date: 05/17/2016 CLINICAL DATA:  Increasing confusion since prior MRI. History of hypertension, diabetes, hypercholesterolemia, end-stage renal disease on dialysis. EXAM: MRI HEAD WITHOUT CONTRAST TECHNIQUE: Multiplanar, multiecho pulse sequences of the brain and surrounding structures were obtained without intravenous contrast. COMPARISON:  MRI of the head May 10, 2016 FINDINGS: Motion degraded examination. Multiple sequences are moderately or severely motion degraded. BRAIN: No reduced diffusion to suggest acute ischemia. GRE sequences severely motion degraded. Extensive susceptibility artifact within the central and periphery of the cerebrum and cerebellum better seen on prior imaging. Moderate to severe ventriculomegaly on the basis of global parenchymal brain volume loss. Confluent supratentorial and deep gray nuclei FLAIR T2 hyperintensities. Patchy T2 hyperintensities in the pons. No midline shift, mass effect or masses. No abnormal extra-axial fluid collections. VASCULAR: Normal major intracranial vascular flow voids present at skull base. SKULL AND UPPER CERVICAL SPINE: No abnormal sellar expansion. No suspicious calvarial bone marrow signal. Craniocervical junction maintained.  SINUSES/ORBITS: Mild paranasal sinus mucosal thickening. RIGHT maxillary mucosal retention cyst. Mastoid air cells are well aerated. The included ocular globes and orbital contents are non-suspicious. OTHER: None. IMPRESSION: 1. No acute intracranial process on this motion degraded examination. 2. Extensive susceptibility artifact most compatible with chronic hypertension. Severe chronic small vessel ischemic disease. 3. Moderate to severe global brain atrophy. Electronically Signed   By: Awilda Metro M.D.   On: 05/17/2016 01:11   Dg Fluoro Rm 1-60 Min  Result Date: 05/17/2016 CLINICAL DATA:  Meningitis EXAM: DIAGNOSTIC LUMBAR PUNCTURE UNDER FLUOROSCOPIC GUIDANCE FLUOROSCOPY TIME:  Fluoroscopy Time:  1 minutes 18 seconds Radiation Exposure Index (if provided by the fluoroscopic device): Number of Acquired Spot Images: 0 PROCEDURE: The patient had hemodialysis immediately prior to the procedure. The patient was confused and unable to give consent. Informed consent was given over the phone from the patient's daughter. I discussed the procedure with the patient's daughter. I performed the lumbar puncture on the patient previously on 05/14/2016. Lower back was prepped with Betadine. 1% lidocaine used for local anesthesia. 20 gauge spinal needles used for attempted lumbar puncture at the L4-5 level. The patient was uncooperative and moving constantly during the procedure. Using two assistants, we attempted to hold the patient still however were unsuccessful. During the previous lumbar puncture, access the fluid was difficult and there was low CSF pressure with very slow dripping of CSF. Despite multiple attempts at needle repositioning, lumbar puncture was unsuccessful. The patient was becoming progressively less cooperative and therefore the procedure was terminated. IMPRESSION: Unsuccessful lumbar puncture. Patient not able to cooperate with exam. Electronically Signed   By: Marlan Palau M.D.   On:  05/17/2016 14:42      Assessment/Plan:  Last night he became hypoglycemic and hypothermic blood cultures were obtained and he was started on vancomycin and cefepime  Principal Problem:   Meningoencephalitis Active Problems:   Type 2 diabetes mellitus (HCC)   Hypertension, renal disease   ESRD on hemodialysis (HCC)   Labile blood glucose   Anemia of chronic disease   Hyperglycemia   Stroke (cerebrum) (HCC)   History of diabetes mellitus   Aseptic meningitis    Michael Dorsey is a 71 y.o. male African with ESRD   Meningoencephalitis  #1 Meningoencephalitis:  I'll his VDRL from spinal fluid is pending his serum RPR is not consistent with neurosyphilis. His crypt coccal antigen from CSF is negative his cultures and CSF are negative his HSV 1 and 2 as well as CMV and JC virus PCR's are negative. His HIV test was negative as is his HIV RNA ultrquant.   I believe his varicella-zoster virus PCR has been canceled, and I also believe the same happened with the MTB PCR.  Repeat MRI of the brain last night was unrevealing  Wanted to obtain a large volume LP as described below but LP was not obtainable by interventional radiology  If we can obtain a large volume LP I would obtain. 32-36 mL at least filling all 4 tubes with spinal fluid  I would send a tube for repeat cell count and differential, protein and glucose  We would then  send a dedicated 10mL to be centrifuged down and used for AFB stain and culture  I would  asimilarly use another 10-mL to centrifuge down and use  for fungal stain and culture AND HOLD FOR SIX weeks  If possible would save the supernatant from the above 2 centrifuge her seizures is so that we can use it along with remaining a CSF to send for:  MTB PCR  VZV PCR  Enterovirus PCR   Arbovirus panel on CSF and serum--> state lab  (can also send our [place] Nile Virus antibody that will be done at our lab)  Histoplasma antigen   Blastomyces  antigen  Coccidioides antibodies by Complement fixation and Immune diffusion  I would send CSF and serum for anti- NMDA antibodies (would discuss with Neurology re other tests they might want for autoimmune encephalitis workup)  Consider CSF ACE level  Consider Human Herpes virus 6, 7 by PCR  Hopefully this CSF profile shows improvement such as reduced cell count and higher glucose than on his initial lumbar puncture  I am skeptical that the acyclovir is really needed but I would like to see with a repeat MRI shows. Certainly he does not have herpes type PCR   #2 Possible sepsis, hypoglycemia and hypothermia: Not clear to me if this is due to infection and sepsis or whether his insulin levels of the to high and it dropped his blood sugars and his temperature. For now would continue vancomycin and cefepime  I would also get a CT of his chest abdomen and pelvis to look for an occult infection or malignancy in the chest abdomen or pelvis.  Given his murmur could consider a transesophageal echocardiogram    #2 Murmur: possibly from AS or flow murmur. Could he have endocarditis? Certainly his MRI of the brain does not show evidence of septic emboli which would be one explanation for his abnormal CSF profile       LOS: 9 days   Acey Lav 05/17/2016, 5:02 PM

## 2016-05-18 ENCOUNTER — Inpatient Hospital Stay (HOSPITAL_COMMUNITY): Payer: Medicaid Other

## 2016-05-18 DIAGNOSIS — R7881 Bacteremia: Secondary | ICD-10-CM

## 2016-05-18 DIAGNOSIS — A4102 Sepsis due to Methicillin resistant Staphylococcus aureus: Secondary | ICD-10-CM

## 2016-05-18 DIAGNOSIS — R6520 Severe sepsis without septic shock: Secondary | ICD-10-CM

## 2016-05-18 LAB — T3, FREE: T3 FREE: 2 pg/mL (ref 2.0–4.4)

## 2016-05-18 LAB — CORTISOL: Cortisol, Plasma: 13 ug/dL

## 2016-05-18 LAB — CBC WITH DIFFERENTIAL/PLATELET
Basophils Absolute: 0 10*3/uL (ref 0.0–0.1)
Basophils Relative: 0 %
EOS ABS: 0.3 10*3/uL (ref 0.0–0.7)
Eosinophils Relative: 3 %
HCT: 29.7 % — ABNORMAL LOW (ref 39.0–52.0)
HEMOGLOBIN: 9.6 g/dL — AB (ref 13.0–17.0)
LYMPHS ABS: 1 10*3/uL (ref 0.7–4.0)
LYMPHS PCT: 13 %
MCH: 29.4 pg (ref 26.0–34.0)
MCHC: 32.3 g/dL (ref 30.0–36.0)
MCV: 91.1 fL (ref 78.0–100.0)
MONOS PCT: 8 %
Monocytes Absolute: 0.6 10*3/uL (ref 0.1–1.0)
NEUTROS ABS: 5.9 10*3/uL (ref 1.7–7.7)
NEUTROS PCT: 76 %
Platelets: 169 10*3/uL (ref 150–400)
RBC: 3.26 MIL/uL — AB (ref 4.22–5.81)
RDW: 15.5 % (ref 11.5–15.5)
WBC: 7.7 10*3/uL (ref 4.0–10.5)

## 2016-05-18 LAB — GLUCOSE, CAPILLARY
GLUCOSE-CAPILLARY: 45 mg/dL — AB (ref 65–99)
GLUCOSE-CAPILLARY: 123 mg/dL — AB (ref 65–99)
GLUCOSE-CAPILLARY: 226 mg/dL — AB (ref 65–99)
GLUCOSE-CAPILLARY: 149 mg/dL — AB (ref 65–99)
Glucose-Capillary: 153 mg/dL — ABNORMAL HIGH (ref 65–99)
Glucose-Capillary: 87 mg/dL (ref 65–99)

## 2016-05-18 MED ORDER — IOPAMIDOL (ISOVUE-300) INJECTION 61%
INTRAVENOUS | Status: AC
  Administered 2016-05-18: 100 mL
  Filled 2016-05-18: qty 100

## 2016-05-18 MED ORDER — IOPAMIDOL (ISOVUE-300) INJECTION 61%
INTRAVENOUS | Status: AC
  Administered 2016-05-18: 30 mL
  Filled 2016-05-18: qty 30

## 2016-05-18 MED ORDER — DEXTROSE 50 % IV SOLN
INTRAVENOUS | Status: AC
  Administered 2016-05-18: 50 mL
  Filled 2016-05-18: qty 50

## 2016-05-18 MED ORDER — INSULIN DETEMIR 100 UNIT/ML ~~LOC~~ SOLN
6.0000 [IU] | Freq: Every day | SUBCUTANEOUS | Status: DC
  Administered 2016-05-18 – 2016-05-30 (×12): 6 [IU] via SUBCUTANEOUS
  Filled 2016-05-18 (×16): qty 0.06

## 2016-05-18 MED ORDER — IOPAMIDOL (ISOVUE-300) INJECTION 61%
INTRAVENOUS | Status: AC
  Filled 2016-05-18: qty 100

## 2016-05-18 NOTE — Progress Notes (Signed)
Subjective: LP was attempted yesterday but was unsucessful. Patient had a hypoglycemic event overnight where is blood sugar was 45. He was treated with 1 amp D50. Possible etiology of inadequate meal intake.   Patient was not looking good this morning. When we woke him up, he was more confused and did not participate in answering questions or physical exam.   Rechecked the patient in the afternoon. Patient was more alert. Patient refused to answer question about himself, his location, or year. Patient did answer when asked about how he felt answered "I don't know how I feel."  Patient followed up with and asked "What's going on with me?",  "Am I dying?"  Objective:  Vital signs in last 24 hours: Vitals:   05/17/16 2022 05/17/16 2326 05/18/16 0514 05/18/16 0825  BP: 135/69 137/64 134/73 (!) 147/78  Pulse: 75 69 66 71  Resp: 20 18 11 16   Temp: 98 F (36.7 C) 97.5 F (36.4 C) (!) 96.3 F (35.7 C) 98.1 F (36.7 C)  TempSrc: Oral Axillary Axillary Axillary  SpO2: 99% 97% 99% 100%  Weight:   72 kg (158 lb 11.7 oz)   Height:       Physical Exam  Constitutional: He appears well-developed and well-nourished.  Neck: No JVD present.  Cardiovascular: Normal rate and regular rhythm.   Holosystolic murmur present heard loudest at RUSB  Pulmonary/Chest: Effort normal and breath sounds normal.  Musculoskeletal: He exhibits no edema.  Neurological:  Patient is very disoriented this morning. Refuses to answer question or participate in physical exam.    CBG (last 3)   Recent Labs  05/18/16 0608 05/18/16 0826 05/18/16 1220  GLUCAP 153* 87 123*   CBC    Component Value Date/Time   WBC 7.7 05/18/2016 0232   RBC 3.26 (L) 05/18/2016 0232   HGB 9.6 (L) 05/18/2016 0232   HCT 29.7 (L) 05/18/2016 0232   PLT 169 05/18/2016 0232   MCV 91.1 05/18/2016 0232   MCH 29.4 05/18/2016 0232   MCHC 32.3 05/18/2016 0232   RDW 15.5 05/18/2016 0232   LYMPHSABS 1.0 05/18/2016 0232   MONOABS 0.6  05/18/2016 0232   EOSABS 0.3 05/18/2016 0232   BASOSABS 0.0 05/18/2016 0232   Cortisol: WNL  TSH, T3, T4: All WNL  Assessment/Plan:  Principal Problem:   Meningoencephalitis Active Problems:   Type 2 diabetes mellitus (HCC)   Hypertension, renal disease   ESRD on hemodialysis (HCC)   Labile blood glucose   Anemia of chronic disease   Hyperglycemia   Stroke (cerebrum) (HCC)   History of diabetes mellitus   Aseptic meningitis   Elevated serum creatinine   Encephalopathy   Hypothermia   Hypoglycemia   71 y.o.malewith HTN, DM, ESRD on HD, who presents w/ 5 day h/o altered mental status secondary to unclear etiology  AMS 2/2 Meningoencephalitis of Unclear Etiology CSF studies thus far have revealed decreased glucose @ 36, increased total protein @, elevated WBC @208  w/98% lymphocytes. Gram stain is negative. Serum RPR was found to be weakly positive with a 1:1 titer. F/U T. Pall Abs were found to be positive. 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 We are awaiting CSF VDRL for confirmation. If positive we will treat patient with Pen G. We are also concerned for viral etiology given CSF profile. If viral, it is most like HSV or VZV. We have started Acyclovir per ID recommendation and will wait for HSV and VZV results. We will continue to work closely with  ID to identify the etiology of patient's meningoencephalitis. Cryptococcal, Hep C, CMV, HSVand HIV were found to be negative. VDRL still pending. ID recommended a large volume LP for several more studies. IR tried to get LP yesterday, but was unsuccessful. Repeat MRI was unrevealing. Will try again for large volume LP on Monday.  -- cont ASA -- f/u CSF cultures -- cont IV Acyclovir -- VDRL pending -- large volume LP maybe Monday?  -- f/u LP studies  MRSA Bacteremia 1/2 patient's BCx came back MRSA positive. As described above, patient is more disoriented than hospital baseline. Unsure of etiology of  bacteremia. We will order a CT chest/abdomen/pelvis to investigate a source for infection. We will continue to treat with Vanc & Cefepime.  -- f/u blood cultures - 1/2 MRSA+ -- cont IV Vanc & Cefepime  Hypothermia Overnight on 2/16, patient became hypothermic, more disoriented and his blood sugar dropped to 62. Temperature was low as 93.4. ID was consulted during the event and patient was started on broad spectrum antibiotics for concern of possible sepsis. Does not meet sepsis criteria currently, but patient seems more confused from inpatient baseline. Thyroid function test came back WNL for TSH, and free T3 & T4.  Patient continues to use bear hugger. -- cont bear hugger  Hematuria Patient had episode of hematuria overnight 2/14. Patient denied any pain with urination. Current differential includes crystal nephropathy 2/2 Acyclovir vs. other underlying renal pathology. Ordered a UA to investigate etiology. -- f/u UA  HTN --Clonidine patch if unable to tolerate orals -- cont home Hydralazine 50 mg po TID,  -- cont carvedilol 25 mg po BID --consider restarting Imdur and his blood pressures tolerate his carvedilol dosage. --Hydralazine PRN BP >185/110. -- cont to monitor   ESRD  -- cont HD w/ recommendation from Nephro  T2DM A1C 12.3 (05/08/16). Patient's blood glucose has been difficult to control.  Patient had a hypoglycemic event overnight where is blood sugar was 45. He was treated with 1 amp D50. Possible etiology of inadequate meal intake. Will talk to diabetes management about a better regimen going forward.  -- SSI  Fluids: None Diet:Carb Modified DVT Prophylaxis: SQH Code Status: FULL  Dispo: Anticipated discharge pending clinical course.   Caryn Bee, Medical Student 05/18/2016, 10:36 AM Pager: [phone]

## 2016-05-18 NOTE — Progress Notes (Signed)
Subjective:  Suggest repeat okay when I talked him today  Antibiotics:  Anti-infectives    Start     Dose/Rate Route Frequency Ordered Stop   05/17/16 1200  vancomycin (VANCOCIN) IVPB 750 mg/150 ml premix     750 mg 150 mL/hr over 60 Minutes Intravenous Every M-W-F (Hemodialysis) 05/16/16 2138     05/16/16 2200  ceFEPIme (MAXIPIME) 1 g in dextrose 5 % 50 mL IVPB     1 g 100 mL/hr over 30 Minutes Intravenous Every 24 hours 05/16/16 2136     05/16/16 2200  vancomycin (VANCOCIN) 1,500 mg in sodium chloride 0.9 % 500 mL IVPB     1,500 mg 250 mL/hr over 120 Minutes Intravenous  Once 05/16/16 2137 05/17/16 0035   05/14/16 2000  acyclovir (ZOVIRAX) 365 mg in dextrose 5 % 100 mL IVPB     5 mg/kg  73 kg 107.3 mL/hr over 60 Minutes Intravenous Every 24 hours 05/14/16 1826        Medications: Scheduled Meds: . acyclovir  5 mg/kg Intravenous Q24H  . aspirin  81 mg Oral Daily  . aspirin  300 mg Rectal Once  . calcium acetate  667 mg Oral TID WC  . ceFEPime (MAXIPIME) IV  1 g Intravenous Q24H  . darbepoetin (ARANESP) injection - DIALYSIS  60 mcg Intravenous Q Wed-HD  . doxercalciferol  6 mcg Intravenous Q M,W,F-HD  . feeding supplement (PRO-STAT SUGAR FREE 64)  30 mL Oral BID  . ferric citrate  210 mg Oral TID WC  . heparin  5,000 Units Subcutaneous Q8H  . hydrALAZINE  100 mg Oral Q8H  . insulin aspart  0-9 Units Subcutaneous TID WC  . insulin detemir  8 Units Subcutaneous QHS  . labetalol  300 mg Oral BID  . multivitamin  1 tablet Oral QHS  . sodium chloride  200 mL Intravenous Q24H  . vancomycin  750 mg Intravenous Q M,W,F-HD   Continuous Infusions: PRN Meds:.acetaminophen **OR** acetaminophen (TYLENOL) oral liquid 160 mg/5 mL **OR** acetaminophen, hydrALAZINE    Objective: Weight change: -3 lb 8.4 oz (-1.6 kg)  Intake/Output Summary (Last 24 hours) at 05/18/16 1227 Last data filed at 05/18/16 0900  Gross per 24 hour  Intake            584.6 ml  Output               420 ml  Net            164.6 ml   Blood pressure (!) 119/56, pulse 72, temperature 98.2 F (36.8 C), temperature source Oral, resp. rate 19, height 5\' 10"  (1.778 m), weight 158 lb 11.7 oz (72 kg), SpO2 100 %. Temp:  [96.3 F (35.7 C)-98.2 F (36.8 C)] 98.2 F (36.8 C) (02/17 1219) Pulse Rate:  [66-81] 72 (02/17 1219) Resp:  [11-31] 19 (02/17 1219) BP: (119-163)/(56-89) 119/56 (02/17 1219) SpO2:  [97 %-100 %] 100 % (02/17 1219) Weight:  [158 lb 11.7 oz (72 kg)] 158 lb 11.7 oz (72 kg) (02/17 0514)  Physical Exam: General: Sleepy and only saying okay when asked virus questions he is wearing the bear hugger   HEENT: anicteric sclera,  EOMI CVS regular rate, normal r,  Systolic murmur throughout precordium Chest: clear to auscultation bilaterally, no wheezing, rales or rhonchi Abdomen: soft nontender, nondistended, normal bowel sounds, Extremities: no severe edema Skin: no rashes Neuro: nonfocal  CBC:  CBC Latest Ref Rng & Units 05/18/2016 05/17/2016 05/15/2016  WBC 4.0 - 10.5 K/uL 7.7 10.7(H) 6.3  Hemoglobin 13.0 - 17.0 g/dL 9.6(E) 4.5(W) 0.9(W)  Hematocrit 39.0 - 52.0 % 29.7(L) 29.6(L) 29.5(L)  Platelets 150 - 400 K/uL 169 165 156      BMET  Recent Labs  05/17/16 0727  NA 133*  K 3.8  CL 94*  CO2 26  GLUCOSE 107*  BUN 43*  CREATININE 6.10*  CALCIUM 9.2     Liver Panel   Recent Labs  05/17/16 0727  ALBUMIN 2.8*       Sedimentation Rate No results for input(s): ESRSEDRATE in the last 72 hours. C-Reactive Protein No results for input(s): CRP in the last 72 hours.  Micro Results: Recent Results (from the past 720 hour(s))  Blood culture (routine x 2)     Status: None   Collection Time: 05/08/16  4:22 PM  Result Value Ref Range Status   Specimen Description BLOOD RIGHT ARM  Final   Special Requests BOTTLES DRAWN AEROBIC AND ANAEROBIC 5CC  Final   Culture NO GROWTH 5 DAYS  Final   Report Status 05/13/2016 FINAL  Final  Blood culture (routine x  2)     Status: None   Collection Time: 05/08/16  4:42 PM  Result Value Ref Range Status   Specimen Description BLOOD RIGHT HAND  Final   Special Requests IN PEDIATRIC BOTTLE 4CC  Final   Culture NO GROWTH 5 DAYS  Final   Report Status 05/13/2016 FINAL  Final  CSF culture     Status: None   Collection Time: 05/14/16  9:39 AM  Result Value Ref Range Status   Specimen Description CSF  Final   Special Requests TUBE 2  Final   Gram Stain   Final    WBC PRESENT, PREDOMINANTLY MONONUCLEAR NO ORGANISMS SEEN CYTOSPIN SMEAR    Culture NO GROWTH 3 DAYS  Final   Report Status 05/17/2016 FINAL  Final  Culture, blood (Routine X 2) w Reflex to ID Panel     Status: None (Preliminary result)   Collection Time: 05/14/16  2:03 PM  Result Value Ref Range Status   Specimen Description BLOOD RIGHT HAND  Final   Special Requests IN PEDIATRIC BOTTLE 4CC  Final   Culture NO GROWTH 4 DAYS  Final   Report Status PENDING  Incomplete  Culture, blood (Routine X 2) w Reflex to ID Panel     Status: None (Preliminary result)   Collection Time: 05/14/16  2:03 PM  Result Value Ref Range Status   Specimen Description BLOOD RIGHT ARM  Final   Special Requests BOTTLES DRAWN AEROBIC AND ANAEROBIC 5CC  Final   Culture NO GROWTH 4 DAYS  Final   Report Status PENDING  Incomplete  Culture, blood (Routine X 2) w Reflex to ID Panel     Status: None (Preliminary result)   Collection Time: 05/16/16  9:36 PM  Result Value Ref Range Status   Specimen Description BLOOD RIGHT HAND  Final   Special Requests BOTTLES DRAWN AEROBIC AND ANAEROBIC 10CC EACH  Final   Culture NO GROWTH 2 DAYS  Final   Report Status PENDING  Incomplete  Culture, blood (Routine X 2) w Reflex to ID Panel     Status: None (Preliminary result)   Collection Time: 05/16/16  9:40 PM  Result Value Ref Range Status   Specimen Description BLOOD RIGHT ANTECUBITAL  Final   Special Requests BOTTLES DRAWN AEROBIC AND ANAEROBIC 5CC EACH  Final   Culture  Setup  Time  Final    GRAM POSITIVE COCCI IN CLUSTERS AEROBIC BOTTLE ONLY CRITICAL RESULT CALLED TO, READ BACK BY AND VERIFIED WITH: G ABBOTT PHARMD [id number] 05/17/16 A BROWNING    Culture   Final    GRAM POSITIVE COCCI CULTURE REINCUBATED FOR BETTER GROWTH    Report Status PENDING  Incomplete  Blood Culture ID Panel (Reflexed)     Status: Abnormal   Collection Time: 05/16/16  9:40 PM  Result Value Ref Range Status   Enterococcus species NOT DETECTED NOT DETECTED Final   Listeria monocytogenes NOT DETECTED NOT DETECTED Final   Staphylococcus species DETECTED (A) NOT DETECTED Final    Comment: CRITICAL RESULT CALLED TO, READ BACK BY AND VERIFIED WITH: G ABBOTT PHARMD [id number] 05/17/16 A BROWNING    Staphylococcus aureus DETECTED (A) NOT DETECTED Final    Comment: Methicillin (oxacillin)-resistant Staphylococcus aureus (MRSA). MRSA is predictably resistant to beta-lactam antibiotics (except ceftaroline). Preferred therapy is vancomycin unless clinically contraindicated. Patient requires contact precautions if  hospitalized. CRITICAL RESULT CALLED TO, READ BACK BY AND VERIFIED WITH: G ABBOTT PHARMD [id number] 05/17/16 A BROWNING    Methicillin resistance DETECTED (A) NOT DETECTED Final    Comment: CRITICAL RESULT CALLED TO, READ BACK BY AND VERIFIED WITH: G ABBOTT PHARMD 2331 05/17/16 A BROWNING    Streptococcus species NOT DETECTED NOT DETECTED Final   Streptococcus agalactiae NOT DETECTED NOT DETECTED Final   Streptococcus pneumoniae NOT DETECTED NOT DETECTED Final   Streptococcus pyogenes NOT DETECTED NOT DETECTED Final   Acinetobacter baumannii NOT DETECTED NOT DETECTED Final   Enterobacteriaceae species NOT DETECTED NOT DETECTED Final   Enterobacter cloacae complex NOT DETECTED NOT DETECTED Final   Escherichia coli NOT DETECTED NOT DETECTED Final   Klebsiella oxytoca NOT DETECTED NOT DETECTED Final   Klebsiella pneumoniae NOT DETECTED NOT DETECTED Final   Proteus species NOT DETECTED NOT DETECTED  Final   Serratia marcescens NOT DETECTED NOT DETECTED Final   Haemophilus influenzae NOT DETECTED NOT DETECTED Final   Neisseria meningitidis NOT DETECTED NOT DETECTED Final   Pseudomonas aeruginosa NOT DETECTED NOT DETECTED Final   Candida albicans NOT DETECTED NOT DETECTED Final   Candida glabrata NOT DETECTED NOT DETECTED Final   Candida krusei NOT DETECTED NOT DETECTED Final   Candida parapsilosis NOT DETECTED NOT DETECTED Final   Candida tropicalis NOT DETECTED NOT DETECTED Final  MRSA PCR Screening     Status: None   Collection Time: 05/17/16  1:36 AM  Result Value Ref Range Status   MRSA by PCR NEGATIVE NEGATIVE Final    Comment:        The GeneXpert MRSA Assay (FDA approved for NASAL specimens only), is one component of a comprehensive MRSA colonization surveillance program. It is not intended to diagnose MRSA infection nor to guide or monitor treatment for MRSA infections.     Studies/Results: Mr Brain Wo Contrast  Result Date: 05/17/2016 CLINICAL DATA:  Increasing confusion since prior MRI. History of hypertension, diabetes, hypercholesterolemia, end-stage renal disease on dialysis. EXAM: MRI HEAD WITHOUT CONTRAST TECHNIQUE: Multiplanar, multiecho pulse sequences of the brain and surrounding structures were obtained without intravenous contrast. COMPARISON:  MRI of the head May 10, 2016 FINDINGS: Motion degraded examination. Multiple sequences are moderately or severely motion degraded. BRAIN: No reduced diffusion to suggest acute ischemia. GRE sequences severely motion degraded. Extensive susceptibility artifact within the central and periphery of the cerebrum and cerebellum better seen on prior imaging. Moderate to severe ventriculomegaly on the basis of global parenchymal brain volume loss. Confluent  supratentorial and deep gray nuclei FLAIR T2 hyperintensities. Patchy T2 hyperintensities in the pons. No midline shift, mass effect or masses. No abnormal extra-axial  fluid collections. VASCULAR: Normal major intracranial vascular flow voids present at skull base. SKULL AND UPPER CERVICAL SPINE: No abnormal sellar expansion. No suspicious calvarial bone marrow signal. Craniocervical junction maintained. SINUSES/ORBITS: Mild paranasal sinus mucosal thickening. RIGHT maxillary mucosal retention cyst. Mastoid air cells are well aerated. The included ocular globes and orbital contents are non-suspicious. OTHER: None. IMPRESSION: 1. No acute intracranial process on this motion degraded examination. 2. Extensive susceptibility artifact most compatible with chronic hypertension. Severe chronic small vessel ischemic disease. 3. Moderate to severe global brain atrophy. Electronically Signed   By: Awilda Metroourtnay  Bloomer M.D.   On: 05/17/2016 01:11   Dg Fluoro Rm 1-60 Min  Result Date: 05/17/2016 CLINICAL DATA:  Meningitis EXAM: DIAGNOSTIC LUMBAR PUNCTURE UNDER FLUOROSCOPIC GUIDANCE FLUOROSCOPY TIME:  Fluoroscopy Time:  1 minutes 18 seconds Radiation Exposure Index (if provided by the fluoroscopic device): Number of Acquired Spot Images: 0 PROCEDURE: The patient had hemodialysis immediately prior to the procedure. The patient was confused and unable to give consent. Informed consent was given over the phone from the patient's daughter. I discussed the procedure with the patient's daughter. I performed the lumbar puncture on the patient previously on 05/14/2016. Lower back was prepped with Betadine. 1% lidocaine used for local anesthesia. 20 gauge spinal needles used for attempted lumbar puncture at the L4-5 level. The patient was uncooperative and moving constantly during the procedure. Using two assistants, we attempted to hold the patient still however were unsuccessful. During the previous lumbar puncture, access the fluid was difficult and there was low CSF pressure with very slow dripping of CSF. Despite multiple attempts at needle repositioning, lumbar puncture was unsuccessful. The  patient was becoming progressively less cooperative and therefore the procedure was terminated. IMPRESSION: Unsuccessful lumbar puncture. Patient not able to cooperate with exam. Electronically Signed   By: Marlan Palauharles  Clark M.D.   On: 05/17/2016 14:42      Assessment/Plan:   MRSA growing from 1/2 blood cultures Principal Problem:   Meningoencephalitis Active Problems:   Type 2 diabetes mellitus (HCC)   Hypertension, renal disease   ESRD on hemodialysis (HCC)   Labile blood glucose   Anemia of chronic disease   Hyperglycemia   Stroke (cerebrum) (HCC)   History of diabetes mellitus   Aseptic meningitis   Elevated serum creatinine   Encephalopathy   Hypothermia   Hypoglycemia    Michael Dorsey is a 71 y.o. 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 I wondered if he could've had MRSA bacteremia before and had endocarditis with seeding of the brain with septic emboli. I discussed the case with Dr Signe ColtUpton and she clarified that the patient had not received any parenteral antibiotics for the prior 30 days prior to admission.  It would seem very strange for him to have a transient MRSA bacteremia that was severe enough to make him encephalopathic and with an abnormal lumbar puncture and yet not grow the organism from blood or spinal fluid.  However this is one possible explanation to unify his presentation though I am not altogether satisfied with it. Certainly I cannot treat the one out of 2 as a contaminant but must treat it. I am concerned however that there is something ELSE going on  I would continue vancomycin for now  I  would consider a transesophageal echocardiogram though he would likely have to be heavily sedated to attain such a procedure  Repeat blood cultures  Ultrasound of fistula  #2 Meningoencephalitis   As mentioned above is the question of whether or not he could've had MRSA  bacteremia with endocarditis and septic emboli the brain to explain his presentation. This is still possible that was a confusing presentation.  I would still recommend getting a CT chest abdomen and pelvis to look for occult infection and/or malignancy that might be playing a role in his presentation including his hypothermia  I would continue the cefepime with vancomycin until we have obtained imaging the chest abdomen and pelvis to exclude occult infection in these sites.  With regards to his meningoencephalitis besides septic emboli to the brain we still are need to consider other possibilities. I do not think he has neurosyphilis. We are waiting for VDRL to come back.  We have been treating with acyclovir until the only pathogen we really could be covering that is a possibility is varus oh zoster and it seems unlikely as well.   Repeat MRI of the brain last night was unrevealing  I still would like to obtain a large volume LP as described below but LP was not obtainable by interventional radiology  If we can obtain a large volume LP I would obtain. 32-36 mL at least filling all 4 tubes with spinal fluid  I would send a tube for repeat cell count and differential, protein and glucose  CSF culture  We would then  send a dedicated 10mL to be centrifuged down and used for AFB stain and culture  I would  asimilarly use another 10-mL to centrifuge down and use  for fungal stain and culture AND HOLD FOR SIX weeks  If possible would save the supernatant from the above 2 centrifuge her seizures is so that we can use it along with remaining a CSF to send for:  MTB PCR  VZV PCR  Enterovirus PCR   Arbovirus panel on CSF and serum--> state lab  (can also send our [place] Nile Virus antibody that will be done at our lab)  Histoplasma antigen   Blastomyces antigen  Coccidioides antibodies by Complement fixation and Immune diffusion  I would send CSF and serum for anti- NMDA antibodies  (would discuss with Neurology re other tests they might want for autoimmune encephalitis workup)  Consider CSF ACE level  Consider Human Herpes virus 6, 7 by PCR  Hopefully this CSF profile shows improvement such as reduced cell count and higher glucose than on his initial lumbar puncture  Would consider reconsulting Neurology for additional input  #3 Sepsis and hypothermia: The above discussion. I really think he needs a CT chest abdomen pelvis to look for potential occult infection. I would keep him on gram-negative coverage until we have done this imaging.  I spent greater than 35  minutes with the patient including greater than 50% coordinating his care with primary team and Nephrology     LOS: 10 days   Acey Lav 05/18/2016, 12:27 PM

## 2016-05-18 NOTE — Progress Notes (Signed)
[facility] Progress Note   Subjective: blood cultures + for MRSA. LP attempted yesterday, no fluid  Vitals:   05/17/16 1724 05/17/16 2022 05/17/16 2326 05/18/16 0514  BP: (!) 163/89 135/69 137/64 134/73  Pulse: 81 75 69 66  Resp: (!) 31 20 18 11   Temp: (!) 96.6 F (35.9 C) 98 F (36.7 C) 97.5 F (36.4 C) (!) 96.3 F (35.7 C)  TempSrc: Axillary Oral Axillary Axillary  SpO2:  99% 97% 99%  Weight:    72 kg (158 lb 11.7 oz)  Height:        Inpatient medications: . acyclovir  5 mg/kg Intravenous Q24H  . aspirin  81 mg Oral Daily  . aspirin  300 mg Rectal Once  . calcium acetate  667 mg Oral TID WC  . ceFEPime (MAXIPIME) IV  1 g Intravenous Q24H  . darbepoetin (ARANESP) injection - DIALYSIS  60 mcg Intravenous Q Wed-HD  . doxercalciferol  6 mcg Intravenous Q M,W,F-HD  . feeding supplement (PRO-STAT SUGAR FREE 64)  30 mL Oral BID  . ferric citrate  210 mg Oral TID WC  . heparin  5,000 Units Subcutaneous Q8H  . hydrALAZINE  100 mg Oral Q8H  . insulin aspart  0-9 Units Subcutaneous TID WC  . insulin detemir  8 Units Subcutaneous QHS  . labetalol  300 mg Oral BID  . multivitamin  1 tablet Oral QHS  . sodium chloride  200 mL Intravenous Q24H  . vancomycin  750 mg Intravenous Q M,W,F-HD    acetaminophen **OR** acetaminophen (TYLENOL) oral liquid 160 mg/5 mL **OR** acetaminophen, hydrALAZINE  Exam: GEN: lying in bed, sleeping NECK No jvd PULM Chest clear bilat Cv: RRR 2/6 murmur RUSB Abd soft ntnd Ext 1+ LE edema LUA AVF+ bruit Neuro: sleeping, moves all extremities but isn't very interactive this AM  Dialysis: MWF AF  4h  2/2 bath  P2  LUA AVF   76kg   Hep 7100 Venofer 50/wk Mircera 50 q 2, last 2.7 Hect 6 ug      Assessment/ Plan:  1. Sepsis 2/2 MRSA bacteremia: hypothermic, less responsive.  Cultures, IV vanc/ cefepime, bair hugger.  Per primary. 2. Encephalopathy: MRI 2/9 with ischemic changes in the L basal ganglia but no acute findings; also  with severe advanced atrophy and diffuse white matter disease.  He most likely has an underlying dementia which is exacerbated by #2.   3. Meningoencephalitis: LP with low glucose, high protein, 208 WBCs with 98% lymphocytic predominance.  Gm stain without organisms but WBCs.  HSV 1 and 2 PCR negative, CMV, and VZV CSF PCRs pending (VZV may have been cancelled).  CSF VDRL pending (but negative test wouldn't necessarily exclude diagnosis) CSF culture pending.  MTB CSF testing also cancelled CSF crypto antigen negative.  HIV negative.  Serum RPR 1:1 with positive treponemal antibodies.  ID is now following, greatly appreciate assistance.  Pt has been started on IV acyclovir (2/13-) which is appropriately dosed for ESRD.  He likely needs to reattempt a larger volume LP per ID- please let us know when this occurs so we can appropriately hold intradialytic heparin 4. Hematuria: UA with essentially no RBCs and small blood.  Doesn't look like a UTI.  Continue to monitor. 5. ESRD HD mwf, next HD Monday Still holding heparin 6. Volume: EDW will likely change at discharge 7. HTN: BP's are overall improving, changed Coreg > labetalol, cont clonidine/ hydral, increased hydralazine to 100 TID.   8. Anemia of  CKD: Hgb 11--> 9.6, restart Aranesp 60 q Wednesday (first dose 12/14-) 9. 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   10. Nutrition: albumin 2.9, prostat   Bufford Buttner MD [facility] pgr [phone] 05/18/2016, 8:07 AM     Recent Labs Lab 05/13/16 1244 05/15/16 0730 05/17/16 0727  NA 133* 131* 133*  K 4.3 4.1 3.8  CL 96* 96* 94*  CO2 25 26 26   GLUCOSE 235* 98 107*  BUN 43* 41* 43*  CREATININE 6.78* 6.31* 6.10*  CALCIUM 9.3 8.9 9.2  PHOS 6.0* 5.5* 4.3    Recent Labs Lab 05/13/16 1244 05/15/16 0730 05/17/16 0727  ALBUMIN 2.8* 2.9* 2.8*    Recent Labs Lab 05/15/16 0745 05/17/16 0726 05/18/16 0232  WBC 6.3 10.7*  7.7  NEUTROABS  --   --  5.9  HGB 9.6* 9.6* 9.6*  HCT 29.5* 29.6* 29.7*  MCV 90.2 90.0 91.1  PLT 156 165 169   Iron/TIBC/Ferritin/ %Sat No results found for: IRON, TIBC, FERRITIN, IRONPCTSAT

## 2016-05-18 NOTE — Progress Notes (Signed)
PHARMACY - PHYSICIAN COMMUNICATION CRITICAL VALUE ALERT - BLOOD CULTURE IDENTIFICATION (BCID)  Results for orders placed or performed during the hospital encounter of 05/08/16  Blood Culture ID Panel (Reflexed) (Collected: 05/16/2016  9:40 PM)  Result Value Ref Range   Enterococcus species NOT DETECTED NOT DETECTED   Listeria monocytogenes NOT DETECTED NOT DETECTED   Staphylococcus species DETECTED (A) NOT DETECTED   Staphylococcus aureus DETECTED (A) NOT DETECTED   Methicillin resistance DETECTED (A) NOT DETECTED   Streptococcus species NOT DETECTED NOT DETECTED   Streptococcus agalactiae NOT DETECTED NOT DETECTED   Streptococcus pneumoniae NOT DETECTED NOT DETECTED   Streptococcus pyogenes NOT DETECTED NOT DETECTED   Acinetobacter baumannii NOT DETECTED NOT DETECTED   Enterobacteriaceae species NOT DETECTED NOT DETECTED   Enterobacter cloacae complex NOT DETECTED NOT DETECTED   Escherichia coli NOT DETECTED NOT DETECTED   Klebsiella oxytoca NOT DETECTED NOT DETECTED   Klebsiella pneumoniae NOT DETECTED NOT DETECTED   Proteus species NOT DETECTED NOT DETECTED   Serratia marcescens NOT DETECTED NOT DETECTED   Haemophilus influenzae NOT DETECTED NOT DETECTED   Neisseria meningitidis NOT DETECTED NOT DETECTED   Pseudomonas aeruginosa NOT DETECTED NOT DETECTED   Candida albicans NOT DETECTED NOT DETECTED   Candida glabrata NOT DETECTED NOT DETECTED   Candida krusei NOT DETECTED NOT DETECTED   Candida parapsilosis NOT DETECTED NOT DETECTED   Candida tropicalis NOT DETECTED NOT DETECTED    Name of physician (or Provider) Contacted:   NA  Changes to prescribed antibiotics required:   Currently receiving Vancomycin and Cefepime.  F/U  ID recommendations  Michael Dorsey, Michael Dorsey 05/18/2016  12:35 AM

## 2016-05-18 NOTE — Progress Notes (Signed)
Hypoglycemic Event  CBG: 45  Treatment: 1 amp D50  Symptoms: unknown  Follow-up CBG: Time: 0609 CBG Result:153  Possible Reasons for Event: Inadequate meal intake  Comments/MD notified: IMTS    Dalicia Kisner E Jovann Luse

## 2016-05-18 NOTE — Progress Notes (Signed)
   Subjective:  Patient non-verbal when seen this morning. He is not following commands but moves his arms spontaneously. LP was unsuccessful yesterday due to patient cooperation.   Objective:  Vital signs in last 24 hours: Vitals:   05/18/16 0514 05/18/16 0825 05/18/16 1219 05/18/16 1458  BP: 134/73 (!) 147/78 (!) 119/56 (!) 156/89  Pulse: 66 71 72   Resp: 11 16 19    Temp: (!) 96.3 F (35.7 C) 98.1 F (36.7 C) 98.2 F (36.8 C)   TempSrc: Axillary Axillary Oral   SpO2: 99% 100% 100%   Weight: 158 lb 11.7 oz (72 kg)     Height:       General: resting in bed, does not appear in distress, bear hugger in place Cardiac: RRR, systolic flow murmur Pulm: clear to auscultation anteriorly Abd: soft, nontender, nondistended Ext: warm and well perfused, no pedal edema, LUE AVF in place with palpable thrill - no obvious sign of infection - brief twitching/spasms seen at upper extremities Neuro: nonverbal this morning, not following commands, moves extremities spontaneously   Assessment/Plan:  Principal Problem:   Meningoencephalitis Active Problems:   Type 2 diabetes mellitus (HCC)   Hypertension, renal disease   ESRD on hemodialysis (HCC)   Labile blood glucose   Anemia of chronic disease   Hyperglycemia   Stroke (cerebrum) (HCC)   History of diabetes mellitus   Aseptic meningitis   Elevated serum creatinine   Encephalopathy   Hypothermia   Hypoglycemia   MRSA bacteremia  Michael Anane-Brobbeyis a 71 y.o.malewith HTN, DM, ESRD on HD, who presents w/ 5 day h/o altered mental status of unclear etiology.  # Meningoencephalitis  Patient's CSF profile as following: 208 white blood cells with a lymphocytic predominance, decreased glucose at 36 and increased protein at 241. The exact etiology of the patient's meningoencephalitis remains unclear at this time. Patient decompensated two nights ago and was more altered and hypothermic. He was started on broad-spectrum antibiotics  with cefepime and vancomycin. Infectious disease has recommended a repeat lumbar puncture with a large volume of CSF removed, however this was unsuccessful. 1/2 Blood Cultures is positive for MRSA. UA also with moderate leukocytes and 6-30 WBCs. For now we will continue the patient on acyclovir, cefepime and vancomycin. Will obtain CT chest/abd/pelvis to evaluate for any obvious source of seeded infection. - Continue aspirin - F/U blood cultures- 1 of 2 MRSA positive - f/u urine culture - RPR- positive one-to-one serum, positive antibody - Follow up VDRL CSF - HSV 1 and 2, CMV, JC virus negative - Continue Vancomycin, Cefepime, Acyclovir - CT Chest/Abdomen/Pelvis W WO contrast - Appreciate infectious disease recommendations  # Hypothermia Tests not suggestive of adrenal insufficiency or thyroid dysfunction.  -- TSH, free T4, free T3 are within normal limits -- Cortisol 13.0 -- Continue vancomycin and cefepime   # Hematuria UA with moderate leukocytes, negative nitrites, 6-30 WBCs, 6-30 RBCs/HPF -- f/u urine culture  # IDDM Patient has labile and difficult to control glucose. - A1c 12.3 (05/08/16) - SSI-S - Decrease Levemir to 6 units qhs  # Hypertension - HD per Nephrology - Hydralazine 100 mg po TID - Labetalol 300 mg twice a day - Hydralazine PRN BP > 185/110.  ESRD on MWF HD: - Continue HD recommendations per nephrology  DVT/PE prophylaxis: Heparin FEN/GI:Carbmodified  Dispo: Anticipated discharge pending clinical course.   Michael McleanVishal Arlin Sass, MD 05/18/2016, 6:07 PM

## 2016-05-18 NOTE — Progress Notes (Signed)
Physical Therapy Treatment Patient Details Name: Michael Dorsey MRN: [id number] DOB: [birth year] Today's Date: 05/18/2016    History of Present Illness 71 year old man with a history of end-stage renal disease requiring hemodialysis, diabetes, hypertension, and hyperlipidemia who presents with a five-day history of altered mental status. MRI on 2/9 showed no acute intracranial abnormality.     PT Comments    Pt presented supine in bed, awake and willing to participate in therapy session. Pt was pleasant but very confused throughout. He followed one-step commands inconsistently with increased time and max verbal, visual and tactile cues. Pt tolerated sitting EOB with supervision x5 minutes, and tolerated standing with RW and min guard for safety x5 minutes. He was able to take lateral steps towards [facility] but unable to ambulate forwards secondary to cognitive deficits. All VSS throughout. Pt would continue to benefit from skilled physical therapy services at this time while admitted and after d/c to address his limitations in order to improve his overall safety and independence with functional mobility.    Follow Up Recommendations  Supervision/Assistance - 24 hour;SNF     Equipment Recommendations  Rolling walker with 5" wheels    Recommendations for Other Services       Precautions / Restrictions Precautions Precautions: Fall Restrictions Weight Bearing Restrictions: No    Mobility  Bed Mobility Overal bed mobility: Needs Assistance Bed Mobility: Supine to Sit;Sit to Supine     Supine to sit: Modified independent (Device/Increase time) Sit to supine: Supervision   General bed mobility comments: increased time, verbal and visual cueing for sequencing  Transfers Overall transfer level: Needs assistance Equipment used: Rolling walker (2 wheeled) Transfers: Sit to/from Stand Sit to Stand: Min guard         General transfer comment: increased time, min guard for  safety  Ambulation/Gait Ambulation/Gait assistance: Min assist Ambulation Distance (Feet): 2 Feet Assistive device: Rolling walker (2 wheeled) Gait Pattern/deviations: Decreased stride length Gait velocity: decreased Gait velocity interpretation: Below normal speed for age/gender General Gait Details: side stepping towards HOB with min A to move RW. pt unable to ambulate forwards even with verbal, visual and tactile cueing.   Stairs            Wheelchair Mobility    Modified Rankin (Stroke Patients Only)       Balance Overall balance assessment: Needs assistance Sitting-balance support: No upper extremity supported;Feet supported Sitting balance-Leahy Scale: Good     Standing balance support: During functional activity;Single extremity supported Standing balance-Leahy Scale: Poor Standing balance comment: pt reliant on at least one UE support on RW                    Cognition Arousal/Alertness: Awake/alert Behavior During Therapy: Flat affect Overall Cognitive Status: Impaired/Different from baseline Area of Impairment: Orientation;Attention;Memory;Following commands;Safety/judgement;Awareness;Problem solving Orientation Level: Disoriented to;Situation Current Attention Level: Selective Memory: Decreased short-term memory Following Commands: Follows one step commands inconsistently;Follows one step commands with increased time Safety/Judgement: Decreased awareness of safety;Decreased awareness of deficits Awareness: Emergent Problem Solving: Slow processing;Decreased initiation;Difficulty sequencing;Requires verbal cues;Requires tactile cues      Exercises      General Comments        Pertinent Vitals/Pain Pain Assessment: Faces Faces Pain Scale: Hurts a little bit Pain Location: generalized Pain Descriptors / Indicators: Discomfort Pain Intervention(s): Monitored during session    Home Living                      Prior Function  PT Goals (current goals can now be found in the care plan section) Acute Rehab PT Goals Patient Stated Goal: None stated PT Goal Formulation: Patient unable to participate in goal setting Time For Goal Achievement: 05/18/16 Potential to Achieve Goals: Good Progress towards PT goals: Progressing toward goals    Frequency    Min 3X/week      PT Plan Current plan remains appropriate    Co-evaluation             End of Session Equipment Utilized During Treatment: Gait belt Activity Tolerance: Patient tolerated treatment well Patient left: in bed;with call bell/phone within reach;with bed alarm set     Time: 1152-1212 PT Time Calculation (min) (ACUTE ONLY): 20 min  Charges:  $Therapeutic Activity: 8-22 mins                    G CodesAlessandra Bevels:      Lisamarie Coke M Jkai Arwood 05/18/2016, 12:57 PM Deborah ChalkJennifer Janecia Palau, PT, DPT [phone]

## 2016-05-19 DIAGNOSIS — R7881 Bacteremia: Secondary | ICD-10-CM

## 2016-05-19 DIAGNOSIS — Z9889 Other specified postprocedural states: Secondary | ICD-10-CM

## 2016-05-19 LAB — URINE CULTURE: CULTURE: NO GROWTH

## 2016-05-19 LAB — CULTURE, BLOOD (ROUTINE X 2)
CULTURE: NO GROWTH
Culture: NO GROWTH

## 2016-05-19 LAB — GLUCOSE, CAPILLARY
GLUCOSE-CAPILLARY: 188 mg/dL — AB (ref 65–99)
GLUCOSE-CAPILLARY: 197 mg/dL — AB (ref 65–99)
Glucose-Capillary: 100 mg/dL — ABNORMAL HIGH (ref 65–99)
Glucose-Capillary: 79 mg/dL (ref 65–99)
Glucose-Capillary: 179 mg/dL — ABNORMAL HIGH (ref 65–99)
Glucose-Capillary: 172 mg/dL — ABNORMAL HIGH (ref 65–99)

## 2016-05-19 NOTE — Progress Notes (Signed)
[facility] Progress Note   Subjective: getting cleaned up today with nursing staff  Vitals:   05/19/16 0300 05/19/16 0400 05/19/16 0511 05/19/16 0800  BP: (!) 161/84 (!) 180/87 131/76 (!) 190/93  Pulse: 62 68  67  Resp: (!) 8 15  16   Temp:  97 F (36.1 C)  97.3 F (36.3 C)  TempSrc:  Oral  Oral  SpO2: 100% 99%  100%  Weight:  72.6 kg (160 lb 0.9 oz)    Height:        Inpatient medications: . acyclovir  5 mg/kg Intravenous Q24H  . aspirin  81 mg Oral Daily  . aspirin  300 mg Rectal Once  . calcium acetate  667 mg Oral TID WC  . ceFEPime (MAXIPIME) IV  1 g Intravenous Q24H  . darbepoetin (ARANESP) injection - DIALYSIS  60 mcg Intravenous Q Wed-HD  . doxercalciferol  6 mcg Intravenous Q M,W,F-HD  . feeding supplement (PRO-STAT SUGAR FREE 64)  30 mL Oral BID  . ferric citrate  210 mg Oral TID WC  . heparin  5,000 Units Subcutaneous Q8H  . hydrALAZINE  100 mg Oral Q8H  . insulin aspart  0-9 Units Subcutaneous TID WC  . insulin detemir  6 Units Subcutaneous QHS  . labetalol  300 mg Oral BID  . multivitamin  1 tablet Oral QHS  . sodium chloride  200 mL Intravenous Q24H  . vancomycin  750 mg Intravenous Q M,W,F-HD    acetaminophen **OR** acetaminophen (TYLENOL) oral liquid 160 mg/5 mL **OR** acetaminophen, hydrALAZINE  Exam: GEN: lying in bed, nonverbal NECK No jvd PULM Chest clear bilat Cv: RRR 2/6 murmur RUSB Abd soft ntnd Ext 1+ LE edema LUA AVF+ bruit Neuro: not very interactive this AM  Dialysis: MWF AF  4h  2/2 bath  P2  LUA AVF   76kg   Hep 7100 Venofer 50/wk Mircera 50 q 2, last 2.7 Hect 6 ug      Assessment/ Plan:  1. Sepsis 2/2 MRSA bacteremia: Per primary and ID ; continue vanc, US fistula, repeat blood cultures.  I have ordered these.  2. Encephalopathy: MRI 2/9 with ischemic changes in the L basal ganglia but no acute findings; also with severe advanced atrophy and diffuse white matter disease.  He most likely has an underlying  dementia which is exacerbated by #2 and #3; but this is quite unusual.   3. Meningoencephalitis: LP with low glucose, high protein, 208 WBCs with 98% lymphocytic predominance.  Gm stain without organisms but WBCs.  HSV 1 and 2 PCR negative, CMV, and VZV CSF PCRs pending (VZV may have been cancelled).  CSF VDRL pending (but negative test wouldn't necessarily exclude diagnosis) CSF culture pending.  MTB CSF testing also cancelled CSF crypto antigen negative.  HIV negative.  Serum RPR 1:1 with positive treponemal antibodies.  ID is now following, greatly appreciate assistance.  Pt has been started on IV acyclovir (2/13-) which is appropriately dosed for ESRD.  He likely needs to reattempt a larger volume LP per ID- please let us know when this occurs so we can appropriately hold intradialytic heparin 4. Hematuria: UA with essentially no RBCs and small blood.  Doesn't look like a UTI.  Continue to monitor. 5. ESRD HD mwf, next HD Monday Still holding heparin 6. Volume: EDW will likely change at discharge 7. HTN: BP's are overall improving, changed Coreg > labetalol, cont clonidine/ hydral, increased hydralazine to 100 TID.   8. Anemia of CKD: Hgb  11--> 9.6, restart Aranesp 60 q Wednesday (first dose 12/14-), no iron in setting of bacteremia 9. 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   10. Nutrition: albumin 2.9, prostat   Bufford Buttner MD [facility] pgr [phone] 05/19/2016, 9:17 AM     Recent Labs Lab 05/13/16 1244 05/15/16 0730 05/17/16 0727  NA 133* 131* 133*  K 4.3 4.1 3.8  CL 96* 96* 94*  CO2 25 26 26   GLUCOSE 235* 98 107*  BUN 43* 41* 43*  CREATININE 6.78* 6.31* 6.10*  CALCIUM 9.3 8.9 9.2  PHOS 6.0* 5.5* 4.3    Recent Labs Lab 05/13/16 1244 05/15/16 0730 05/17/16 0727  ALBUMIN 2.8* 2.9* 2.8*    Recent Labs Lab 05/15/16 0745 05/17/16 0726 05/18/16 0232  WBC 6.3 10.7* 7.7  NEUTROABS  --   --  5.9   HGB 9.6* 9.6* 9.6*  HCT 29.5* 29.6* 29.7*  MCV 90.2 90.0 91.1  PLT 156 165 169   Iron/TIBC/Ferritin/ %Sat No results found for: IRON, TIBC, FERRITIN, IRONPCTSAT

## 2016-05-19 NOTE — Progress Notes (Signed)
Subjective:  Patient non-verbal again when seen this morning. He attempts to squeeze my fingers with weak grip but otherwise does not follow much commands. Wife is present in the room. Staff reports that patient was hallucinating overnight, seeing someone in the chair that was not actually present. They also report excess urination on the bed pads.  Objective:  Vital signs in last 24 hours: Vitals:   05/19/16 0300 05/19/16 0400 05/19/16 0511 05/19/16 0800  BP: (!) 161/84 (!) 180/87 131/76 (!) 190/93  Pulse: 62 68  67  Resp: (!) 8 15  16   Temp:  97 F (36.1 C)  97.3 F (36.3 C)  TempSrc:  Oral  Oral  SpO2: 100% 99%  100%  Weight:  160 lb 0.9 oz (72.6 kg)    Height:       General: resting in bed, does not appear in distress Cardiac: RRR, systolic flow murmur Pulm: clear to auscultation anteriorly Abd: soft, nontender, nondistended Ext: warm and well perfused, no pedal edema, LUE AVF in place with palpable thrill - no obvious sign of infection - brief twitching/spasms seen at upper extremities Neuro: nonverbal this morning, not following commands, moves extremities spontaneously   Assessment/Plan:  Principal Problem:   Meningoencephalitis Active Problems:   Type 2 diabetes mellitus (HCC)   Hypertension, renal disease   ESRD on hemodialysis (HCC)   Labile blood glucose   Anemia of chronic disease   Hyperglycemia   Stroke (cerebrum) (HCC)   History of diabetes mellitus   Meningitis   Elevated serum creatinine   Encephalopathy   Hypothermia   Hypoglycemia   MRSA bacteremia  Mr.Koby Anane-Brobbeyis a 71 y.o.malewith HTN, DM, ESRD on HD, who presents w/ 5 day h/o altered mental status of unclear etiology.  # Meningoencephalitis  Patient's CSF profile as following: 208 white blood cells with a lymphocytic predominance, decreased glucose at 36 and increased protein at 241. Etiology remains unclear at this time. Infectious disease has recommended a repeat lumbar  puncture with a large volume of CSF removed, however this was unsuccessful. 1/2 Blood Cultures is positive for MRSA. For now we will continue the patient on acyclovir and vancomycin, d/c Cefepime per ID recs. CT chest/abd/pelvis showed several hepatic cysts, prostatomegaly, and changes suggestive of bladder outlet obstruction. Will try to obtain repeat LP if patient cooperative, may need sedation. Report of hallucinations also brings Lewy Body Dementia into the differential. Given patient's clinical course, we have discussed possible transfer to one [facility] the [facility] for [facility] and care. Wife will discuss with her family. - F/U blood cultures - 1 of 2 MRSA positive - RPR- positive one-to-one serum, positive antibody - Follow up VDRL CSF - HSV 1 and 2, CMV, JC virus negative - Continue Vancomycin, Acyclovir - Appreciate infectious disease recommendations - Will try for repeat IR guided LP tomorrow prior to HD - Nephrology notified - [facility]   # MRSA Bacteremia: 1 of 2 blood cultures drawn 05/16/16 positive. Repeat Blood Cultures ordered. Duplex U/S of AVF ordered. -Continue Vancomycin  # Hypothermia Tests not suggestive of adrenal insufficiency or thyroid dysfunction. Temps improving today. -- TSH, free T4, free T3 are within normal limits -- Cortisol 13.0 -- Continue vancomycin  # Hematuria UA with moderate leukocytes, negative nitrites, 6-30 WBCs, 6-30 RBCs/HPF -- urine culture no growth  # IDDM Patient has labile and difficult to control glucose. - A1c 12.3 (05/08/16) - SSI-S - Levemir 6 units qhs  # Hypertension -  HD per Nephrology - Hydralazine 100 mg po TID - Labetalol 300 mg twice a day - Hydralazine PRN BP > 185/110. - Consider adding alpha-blocker   ESRD on MWF HD: - Continue HD recommendations per nephrology  DVT/PE prophylaxis: Heparin FEN/GI:Carbmodified  Dispo: Anticipated discharge pending  clinical course.   Darreld Mclean, MD 05/19/2016, 1:12 PM

## 2016-05-19 NOTE — Progress Notes (Signed)
Pharmacy Antibiotic Note  Michael BookmanJohn Dorsey is a 71 y.o. male admitted on 05/08/2016 with sepsis.  Pharmacy has been consulted for  vancomycin and acyclovir dosing. Patient has history of ESRD on HD MWF with next planned HD scheduled for 2/19. Today is day 4 of vancomycin and day 6 of acyclovir. Patient is afebrile with WBC count within normal limits. Patient has been tolerating HD sessions well.   Plan: Continue Vancomycin 750 mg q HD MWF Monitor culture data, renal function and clinical course Target pre-HD vancomycin level 15-25 mcg/mL Continue acyclovir 5 mg/kg every 24 hours   Height: 5\' 10"  (177.8 cm) Weight: 160 lb 0.9 oz (72.6 kg) IBW/kg (Calculated) : 73  Temp (24hrs), Avg:97.6 F (36.4 C), Min:97 F (36.1 C), Max:98.2 F (36.8 C)   Recent Labs Lab 05/13/16 1244 05/14/16 1148 05/15/16 0730 05/15/16 0745 05/17/16 0726 05/17/16 0727 05/18/16 0232  WBC 5.2 7.0  --  6.3 10.7*  --  7.7  CREATININE 6.78*  --  6.31*  --   --  6.10*  --     Estimated Creatinine Clearance: 11.2 mL/min (by C-G formula based on SCr of 6.1 mg/dL (H)).    No Known Allergies   Antimicrobials this admission:  Acyclovir 2/13>> Cefepime 2/15 >>2/18 Vanc 2/15 >>   Microbiology results:  2/18 BCx: In process 2/17 UCx: No  Growth Final 2/16 MRSA pcr>> negative 2/15 BCx >> MRSA 1/2 bottles  2/13 CSF>>negative, cryptococcal antigen neg, HSV 1 and 2 neg, CMV and JC neg 2/16 HIV>> negative 2/13 BC x 2>>ngtd 2/7 BCx: >>negative   Bailey MechEmily Stewart, PharmD PGY1 Pharmacy Resident Pager: [phone] 05/19/2016 11:28 AM

## 2016-05-19 NOTE — Progress Notes (Addendum)
Subjective:  Not conversing with me other than telling me his name. He does converse with his wife in his native language more  Antibiotics:  Anti-infectives    Start     Dose/Rate Route Frequency Ordered Stop   05/17/16 1200  vancomycin (VANCOCIN) IVPB 750 mg/150 ml premix     750 mg 150 mL/hr over 60 Minutes Intravenous Every M-W-F (Hemodialysis) 05/16/16 2138     05/16/16 2200  ceFEPIme (MAXIPIME) 1 g in dextrose 5 % 50 mL IVPB  Status:  Discontinued     1 g 100 mL/hr over 30 Minutes Intravenous Every 24 hours 05/16/16 2136 05/19/16 0949   05/16/16 2200  vancomycin (VANCOCIN) 1,500 mg in sodium chloride 0.9 % 500 mL IVPB     1,500 mg 250 mL/hr over 120 Minutes Intravenous  Once 05/16/16 2137 05/17/16 0035   05/14/16 2000  acyclovir (ZOVIRAX) 365 mg in dextrose 5 % 100 mL IVPB     5 mg/kg  73 kg 107.3 mL/hr over 60 Minutes Intravenous Every 24 hours 05/14/16 1826        Medications: Scheduled Meds: . acyclovir  5 mg/kg Intravenous Q24H  . aspirin  81 mg Oral Daily  . aspirin  300 mg Rectal Once  . calcium acetate  667 mg Oral TID WC  . darbepoetin (ARANESP) injection - DIALYSIS  60 mcg Intravenous Q Wed-HD  . doxercalciferol  6 mcg Intravenous Q M,W,F-HD  . feeding supplement (PRO-STAT SUGAR FREE 64)  30 mL Oral BID  . ferric citrate  210 mg Oral TID WC  . heparin  5,000 Units Subcutaneous Q8H  . hydrALAZINE  100 mg Oral Q8H  . insulin aspart  0-9 Units Subcutaneous TID WC  . insulin detemir  6 Units Subcutaneous QHS  . labetalol  300 mg Oral BID  . multivitamin  1 tablet Oral QHS  . sodium chloride  200 mL Intravenous Q24H  . vancomycin  750 mg Intravenous Q M,W,F-HD   Continuous Infusions: PRN Meds:.acetaminophen **OR** acetaminophen (TYLENOL) oral liquid 160 mg/5 mL **OR** acetaminophen, hydrALAZINE    Objective: Weight change: 1 lb 1.6 oz (0.5 kg)  Intake/Output Summary (Last 24 hours) at 05/19/16 1245 Last data filed at 05/18/16 1300  Gross per  24 hour  Intake               50 ml  Output                0 ml  Net               50 ml   Blood pressure (!) 190/93, pulse 67, temperature 97.3 F (36.3 C), temperature source Oral, resp. rate 16, height 5\' 10"  (1.778 m), weight 160 lb 0.9 oz (72.6 kg), SpO2 100 %. Temp:  [97 F (36.1 C)-98.2 F (36.8 C)] 97.3 F (36.3 C) (02/18 0800) Pulse Rate:  [62-76] 67 (02/18 0800) Resp:  [8-21] 16 (02/18 0800) BP: (131-190)/(76-93) 190/93 (02/18 0800) SpO2:  [99 %-100 %] 100 % (02/18 0800) Weight:  [160 lb 0.9 oz (72.6 kg)] 160 lb 0.9 oz (72.6 kg) (02/18 0400)  Physical Exam: General: Alert and awake but confused  HEENT: anicteric sclera,  EOMI CVS regular rate, normal r,  Systolic murmur throughout precordium Chest: clear to auscultation bilaterally, no wheezing, rales or rhonchi Abdomen: soft nontender, nondistended, normal bowel sounds, Extremities: no severe edema Skin: no rashes Neuro: nonfocal  CBC:  CBC Latest Ref Rng &  Units 05/18/2016 05/17/2016 05/15/2016  WBC 4.0 - 10.5 K/uL 7.7 10.7(H) 6.3  Hemoglobin 13.0 - 17.0 g/dL 1.6(X) 0.9(U) 0.4(V)  Hematocrit 39.0 - 52.0 % 29.7(L) 29.6(L) 29.5(L)  Platelets 150 - 400 K/uL 169 165 156      BMET  Recent Labs  05/17/16 0727  NA 133*  K 3.8  CL 94*  CO2 26  GLUCOSE 107*  BUN 43*  CREATININE 6.10*  CALCIUM 9.2     Liver Panel   Recent Labs  05/17/16 0727  ALBUMIN 2.8*       Sedimentation Rate No results for input(s): ESRSEDRATE in the last 72 hours. C-Reactive Protein No results for input(s): CRP in the last 72 hours.  Micro Results: Recent Results (from the past 720 hour(s))  Blood culture (routine x 2)     Status: None   Collection Time: 05/08/16  4:22 PM  Result Value Ref Range Status   Specimen Description BLOOD RIGHT ARM  Final   Special Requests BOTTLES DRAWN AEROBIC AND ANAEROBIC 5CC  Final   Culture NO GROWTH 5 DAYS  Final   Report Status 05/13/2016 FINAL  Final  Blood culture (routine x 2)      Status: None   Collection Time: 05/08/16  4:42 PM  Result Value Ref Range Status   Specimen Description BLOOD RIGHT HAND  Final   Special Requests IN PEDIATRIC BOTTLE 4CC  Final   Culture NO GROWTH 5 DAYS  Final   Report Status 05/13/2016 FINAL  Final  CSF culture     Status: None   Collection Time: 05/14/16  9:39 AM  Result Value Ref Range Status   Specimen Description CSF  Final   Special Requests TUBE 2  Final   Gram Stain   Final    WBC PRESENT, PREDOMINANTLY MONONUCLEAR NO ORGANISMS SEEN CYTOSPIN SMEAR    Culture NO GROWTH 3 DAYS  Final   Report Status 05/17/2016 FINAL  Final  Culture, blood (Routine X 2) w Reflex to ID Panel     Status: None   Collection Time: 05/14/16  2:03 PM  Result Value Ref Range Status   Specimen Description BLOOD RIGHT HAND  Final   Special Requests IN PEDIATRIC BOTTLE 4CC  Final   Culture NO GROWTH 5 DAYS  Final   Report Status 05/19/2016 FINAL  Final  Culture, blood (Routine X 2) w Reflex to ID Panel     Status: None   Collection Time: 05/14/16  2:03 PM  Result Value Ref Range Status   Specimen Description BLOOD RIGHT ARM  Final   Special Requests BOTTLES DRAWN AEROBIC AND ANAEROBIC 5CC  Final   Culture NO GROWTH 5 DAYS  Final   Report Status 05/19/2016 FINAL  Final  Culture, blood (Routine X 2) w Reflex to ID Panel     Status: None (Preliminary result)   Collection Time: 05/16/16  9:36 PM  Result Value Ref Range Status   Specimen Description BLOOD RIGHT HAND  Final   Special Requests BOTTLES DRAWN AEROBIC AND ANAEROBIC 10CC EACH  Final   Culture NO GROWTH 3 DAYS  Final   Report Status PENDING  Incomplete  Culture, blood (Routine X 2) w Reflex to ID Panel     Status: Abnormal (Preliminary result)   Collection Time: 05/16/16  9:40 PM  Result Value Ref Range Status   Specimen Description BLOOD RIGHT ANTECUBITAL  Final   Special Requests BOTTLES DRAWN AEROBIC AND ANAEROBIC [facility] EACH  Final   Culture  Setup Time   Final    GRAM POSITIVE COCCI  IN CLUSTERS AEROBIC BOTTLE ONLY CRITICAL RESULT CALLED TO, READ BACK BY AND VERIFIED WITH: G ABBOTT PHARMD [id number] 05/17/16 A BROWNING    Culture (A)  Final    STAPHYLOCOCCUS AUREUS SUSCEPTIBILITIES TO FOLLOW    Report Status PENDING  Incomplete  Blood Culture ID Panel (Reflexed)     Status: Abnormal   Collection Time: 05/16/16  9:40 PM  Result Value Ref Range Status   Enterococcus species NOT DETECTED NOT DETECTED Final   Listeria monocytogenes NOT DETECTED NOT DETECTED Final   Staphylococcus species DETECTED (A) NOT DETECTED Final    Comment: CRITICAL RESULT CALLED TO, READ BACK BY AND VERIFIED WITH: G ABBOTT PHARMD [id number] 05/17/16 A BROWNING    Staphylococcus aureus DETECTED (A) NOT DETECTED Final    Comment: Methicillin (oxacillin)-resistant Staphylococcus aureus (MRSA). MRSA is predictably resistant to beta-lactam antibiotics (except ceftaroline). Preferred therapy is vancomycin unless clinically contraindicated. Patient requires contact precautions if  hospitalized. CRITICAL RESULT CALLED TO, READ BACK BY AND VERIFIED WITH: G ABBOTT PHARMD [id number] 05/17/16 A BROWNING    Methicillin resistance DETECTED (A) NOT DETECTED Final    Comment: CRITICAL RESULT CALLED TO, READ BACK BY AND VERIFIED WITH: G ABBOTT PHARMD 2331 05/17/16 A BROWNING    Streptococcus species NOT DETECTED NOT DETECTED Final   Streptococcus agalactiae NOT DETECTED NOT DETECTED Final   Streptococcus pneumoniae NOT DETECTED NOT DETECTED Final   Streptococcus pyogenes NOT DETECTED NOT DETECTED Final   Acinetobacter baumannii NOT DETECTED NOT DETECTED Final   Enterobacteriaceae species NOT DETECTED NOT DETECTED Final   Enterobacter cloacae complex NOT DETECTED NOT DETECTED Final   Escherichia coli NOT DETECTED NOT DETECTED Final   Klebsiella oxytoca NOT DETECTED NOT DETECTED Final   Klebsiella pneumoniae NOT DETECTED NOT DETECTED Final   Proteus species NOT DETECTED NOT DETECTED Final   Serratia marcescens NOT DETECTED NOT  DETECTED Final   Haemophilus influenzae NOT DETECTED NOT DETECTED Final   Neisseria meningitidis NOT DETECTED NOT DETECTED Final   Pseudomonas aeruginosa NOT DETECTED NOT DETECTED Final   Candida albicans NOT DETECTED NOT DETECTED Final   Candida glabrata NOT DETECTED NOT DETECTED Final   Candida krusei NOT DETECTED NOT DETECTED Final   Candida parapsilosis NOT DETECTED NOT DETECTED Final   Candida tropicalis NOT DETECTED NOT DETECTED Final  MRSA PCR Screening     Status: None   Collection Time: 05/17/16  1:36 AM  Result Value Ref Range Status   MRSA by PCR NEGATIVE NEGATIVE Final    Comment:        The GeneXpert MRSA Assay (FDA approved for NASAL specimens only), is one component of a comprehensive MRSA colonization surveillance program. It is not intended to diagnose MRSA infection nor to guide or monitor treatment for MRSA infections.   Culture, Urine     Status: None   Collection Time: 05/18/16  9:38 AM  Result Value Ref Range Status   Specimen Description URINE, CLEAN CATCH  Final   Special Requests NONE  Final   Culture NO GROWTH  Final   Report Status 05/19/2016 FINAL  Final    Studies/Results: Ct Chest W Contrast  Result Date: 05/18/2016 CLINICAL DATA:  71 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 EXAM: CT CHEST, ABDOMEN, AND PELVIS WITH CONTRAST TECHNIQUE: Multidetector CT imaging of the chest, abdomen and pelvis was performed following the standard protocol during bolus administration of intravenous contrast. CONTRAST:  1 ISOVUE-300 IOPAMIDOL (ISOVUE-300) INJECTION 61% COMPARISON:  CT the abdomen and pelvis 09/21/2013. FINDINGS: CT CHEST FINDINGS Cardiovascular: Heart size is enlarged with left ventricular hypertrophy. There is no significant pericardial fluid, thickening or pericardial calcification. There is aortic atherosclerosis, as well as atherosclerosis of the great vessels of the mediastinum and the coronary arteries, including calcified atherosclerotic  plaque in the left main, left anterior descending and right coronary arteries. Severe calcifications of the aortic valve. Mediastinum/Nodes: No pathologically enlarged mediastinal or hilar lymph nodes. Esophagus is unremarkable in appearance. No axillary lymphadenopathy. Lungs/Pleura: Calcified granuloma in the left lower lobe. No other suspicious appearing pulmonary nodules or masses. Small focus of consolidation in the inferior segment of the lingula. Lungs are otherwise well aerated. No pleural effusions. Musculoskeletal: There are no aggressive appearing lytic or blastic lesions noted in the visualized portions of the skeleton. CT ABDOMEN PELVIS FINDINGS Hepatobiliary: Well-defined low-attenuation liver lesions similar to prior examinations, compatible with cysts, largest of which measures 2.3 cm between segments 2 and 4A, slightly smaller than the prior study, and demonstrating intermediate attenuation, presumably an involuting mildly proteinaceous cyst. No suspicious hepatic lesions are noted. No intra or extrahepatic biliary ductal dilatation. Gallbladder is normal in appearance. Pancreas: No pancreatic mass. No pancreatic ductal dilatation. No pancreatic or peripancreatic fluid or inflammatory changes. Spleen: Small linear low-attenuation region in the posterior aspect of the spleen likely reflects a small infarct or scar. Spleen is otherwise unremarkable in appearance. Adrenals/Urinary Tract: Status post right nephrectomy. Multifocal cortical thinning in the left kidney. subcentimeter low-attenuation lesions in the left kidney, too small to characterize, but statistically likely to represent tiny cysts. No left-sided hydroureteronephrosis. Urinary bladder demonstrates some circumferential wall thickening, suggestive of bladder outlet obstruction. Bilateral adrenal glands are normal in appearance. Stomach/Bowel: The appearance of the stomach is normal. There is no pathologic dilatation of small bowel or  colon. The appendix is not confidently identified and may be surgically absent. Regardless, there are no inflammatory changes noted adjacent to the cecum to suggest the presence of an acute appendicitis at this time. Vascular/Lymphatic: Aortic atherosclerosis, without evidence of aneurysm or dissection in the abdominal or pelvic vasculature. No lymphadenopathy noted in the abdomen or pelvis. Reproductive: Prostate gland is enlarged with median lobe hypertrophy. Prostate gland overall measures 5.8 x 5.2 x 6.7 cm. Seminal vesicles are unremarkable in appearance. Other: No significant volume of ascites.  No pneumoperitoneum. Musculoskeletal: There are no aggressive appearing lytic or blastic lesions noted in the visualized portions of the skeleton. IMPRESSION: 1. Small focus of consolidation in the inferior segment of the lingula concerning for a small focus of pneumonia. 2. No other acute findings are noted elsewhere in the chest, abdomen or pelvis. 3. Prostatomegaly with median lobe hypertrophy in the prostate gland. This is associated with some circumferential thickening of the urinary bladder which suggests bladder outlet obstruction. 4. Cardiomegaly with left ventricular hypertrophy. 5. There are calcifications of the aortic valve. Echocardiographic correlation for evaluation of potential valvular dysfunction may be warranted if clinically indicated. 6. Aortic atherosclerosis, in addition to left main and 2 vessel coronary artery disease. Assessment for potential risk factor modification, dietary therapy or pharmacologic therapy may be warranted, if clinically indicated. 7. Additional incidental findings, as above. Electronically Signed   By: Trudie Reed M.D.   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 EXAM: CT CHEST, ABDOMEN, AND PELVIS WITH CONTRAST TECHNIQUE: Multidetector CT imaging of the chest,  abdomen and pelvis  was performed following the standard protocol during bolus administration of intravenous contrast. CONTRAST:  1 ISOVUE-300 IOPAMIDOL (ISOVUE-300) INJECTION 61% COMPARISON:  CT the abdomen and pelvis 09/21/2013. FINDINGS: CT CHEST FINDINGS Cardiovascular: Heart size is enlarged with left ventricular hypertrophy. There is no significant pericardial fluid, thickening or pericardial calcification. There is aortic atherosclerosis, as well as atherosclerosis of the great vessels of the mediastinum and the coronary arteries, including calcified atherosclerotic plaque in the left main, left anterior descending and right coronary arteries. Severe calcifications of the aortic valve. Mediastinum/Nodes: No pathologically enlarged mediastinal or hilar lymph nodes. Esophagus is unremarkable in appearance. No axillary lymphadenopathy. Lungs/Pleura: Calcified granuloma in the left lower lobe. No other suspicious appearing pulmonary nodules or masses. Small focus of consolidation in the inferior segment of the lingula. Lungs are otherwise well aerated. No pleural effusions. Musculoskeletal: There are no aggressive appearing lytic or blastic lesions noted in the visualized portions of the skeleton. CT ABDOMEN PELVIS FINDINGS Hepatobiliary: Well-defined low-attenuation liver lesions similar to prior examinations, compatible with cysts, largest of which measures 2.3 cm between segments 2 and 4A, slightly smaller than the prior study, and demonstrating intermediate attenuation, presumably an involuting mildly proteinaceous cyst. No suspicious hepatic lesions are noted. No intra or extrahepatic biliary ductal dilatation. Gallbladder is normal in appearance. Pancreas: No pancreatic mass. No pancreatic ductal dilatation. No pancreatic or peripancreatic fluid or inflammatory changes. Spleen: Small linear low-attenuation region in the posterior aspect of the spleen likely reflects a small infarct or scar. Spleen is  otherwise unremarkable in appearance. Adrenals/Urinary Tract: Status post right nephrectomy. Multifocal cortical thinning in the left kidney. subcentimeter low-attenuation lesions in the left kidney, too small to characterize, but statistically likely to represent tiny cysts. No left-sided hydroureteronephrosis. Urinary bladder demonstrates some circumferential wall thickening, suggestive of bladder outlet obstruction. Bilateral adrenal glands are normal in appearance. Stomach/Bowel: The appearance of the stomach is normal. There is no pathologic dilatation of small bowel or colon. The appendix is not confidently identified and may be surgically absent. Regardless, there are no inflammatory changes noted adjacent to the cecum to suggest the presence of an acute appendicitis at this time. Vascular/Lymphatic: Aortic atherosclerosis, without evidence of aneurysm or dissection in the abdominal or pelvic vasculature. No lymphadenopathy noted in the abdomen or pelvis. Reproductive: Prostate gland is enlarged with median lobe hypertrophy. Prostate gland overall measures 5.8 x 5.2 x 6.7 cm. Seminal vesicles are unremarkable in appearance. Other: No significant volume of ascites.  No pneumoperitoneum. Musculoskeletal: There are no aggressive appearing lytic or blastic lesions noted in the visualized portions of the skeleton. IMPRESSION: 1. Small focus of consolidation in the inferior segment of the lingula concerning for a small focus of pneumonia. 2. No other acute findings are noted elsewhere in the chest, abdomen or pelvis. 3. Prostatomegaly with median lobe hypertrophy in the prostate gland. This is associated with some circumferential thickening of the urinary bladder which suggests bladder outlet obstruction. 4. Cardiomegaly with left ventricular hypertrophy. 5. There are calcifications of the aortic valve. Echocardiographic correlation for evaluation of potential valvular dysfunction may be warranted if clinically  indicated. 6. Aortic atherosclerosis, in addition to left main and 2 vessel coronary artery disease. Assessment for potential risk factor modification, dietary therapy or pharmacologic therapy may be warranted, if clinically indicated. 7. Additional incidental findings, as above. Electronically Signed   By: Trudie Reed M.D.   On: 05/18/2016 21:58   Dg Fluoro Rm 1-60 Min  Result Date: 05/17/2016 CLINICAL DATA:  Meningitis EXAM: DIAGNOSTIC  LUMBAR PUNCTURE UNDER FLUOROSCOPIC GUIDANCE FLUOROSCOPY TIME:  Fluoroscopy Time:  1 minutes 18 seconds Radiation Exposure Index (if provided by the fluoroscopic device): Number of Acquired Spot Images: 0 PROCEDURE: The patient had hemodialysis immediately prior to the procedure. The patient was confused and unable to give consent. Informed consent was given over the phone from the patient's daughter. I discussed the procedure with the patient's daughter. I performed the lumbar puncture on the patient previously on 05/14/2016. Lower back was prepped with Betadine. 1% lidocaine used for local anesthesia. 20 gauge spinal needles used for attempted lumbar puncture at the L4-5 level. The patient was uncooperative and moving constantly during the procedure. Using two assistants, we attempted to hold the patient still however were unsuccessful. During the previous lumbar puncture, access the fluid was difficult and there was low CSF pressure with very slow dripping of CSF. Despite multiple attempts at needle repositioning, lumbar puncture was unsuccessful. The patient was becoming progressively less cooperative and therefore the procedure was terminated. IMPRESSION: Unsuccessful lumbar puncture. Patient not able to cooperate with exam. Electronically Signed   By: Marlan Palauharles  Clark M.D.   On: 05/17/2016 14:42      Assessment/Plan:  CT of chest, abdomen and pelvis unrevealing for other infection or malignancy  Principal Problem:   Meningoencephalitis Active Problems:   Type  2 diabetes mellitus (HCC)   Hypertension, renal disease   ESRD on hemodialysis (HCC)   Labile blood glucose   Anemia of chronic disease   Hyperglycemia   Stroke (cerebrum) (HCC)   History of diabetes mellitus   Aseptic meningitis   Elevated serum creatinine   Encephalopathy   Hypothermia   Hypoglycemia   MRSA bacteremia    Michael Dorsey is a 71 y.o. male African with ESRD   Meningoencephalitis of unclear cause now with MRSA bacteremia, hypothermia, hypoglycemia  #1 MRSA bacteremia:  Very strange case. I wondered if he could've had MRSA bacteremia before and had endocarditis with seeding of the brain with septic emboli. I discussed the case with Dr Signe ColtUpton and she clarified that the patient had not received any parenteral antibiotics for the prior 30 days prior to admission.  It would seem very strange for him to have a transient MRSA bacteremia that was severe enough to make him encephalopathic and with an abnormal lumbar puncture and yet not grow the organism from blood or spinal fluid.  However this is one possible explanation to unify his presentation though I am not altogether satisfied with it. Certainly I cannot treat the one out of 2 as a contaminant but must treat it. I am concerned however that there is something ELSE going on.   THe CT scan As not revealed any occult infection so we can discontinue the cefepime  Continue vancomycin   Repeat blood cultures taken today  On further consideration I  would not do a TEE as this patient to me would seem an extremely poor candidate for cardiothoracic surgery.   Ultrasound of fistula is pending  #2 Meningoencephalitis  As mentioned above is the question of whether or not he could've had MRSA bacteremia with endocarditis and septic emboli the brain to explain his presentation. This is still possible that was a confusing presentation.  Continue vancomycin  With regards to his meningoencephalitis besides septic emboli to  the brain we still are need to consider other possibilities. I do NOT think he has neurosyphilis. We are waiting for VDRL to come back (and I have called lab to double check  this)  We have been treating with acyclovir though the only pathogen we really could be covering that is a possibility is VZV and it seems unlikely as well.  Again I have called micro to see if this test is actually being done or was cancelled  Repeat MRI of the brain  was unrevealing  Would review with Neuroradiologist to see if any subtle finding that is on MRI  Would a CT with contrast be helpful? He would not have to stay still for as long  I wonder if he suffered an anoxic brain injury in the setting of his current presentation  To further elucidate what is going on in his central nervous system I still would recommend a large volume LP a  with 32-36 mL at least filling all 4 tubes with spinal fluid  I would send a tube for repeat cell count and differential, protein and glucose  CSF culture  We would then  send a dedicated 10mL to be centrifuged down and used for AFB stain and culture  I would  asimilarly use another 10-mL to centrifuge down and use  for fungal stain and culture AND HOLD FOR SIX weeks  If possible would save the supernatant from the above 2 centrifuge her seizures is so that we can use it along with remaining a CSF to send for:  MTB PCR  VZV PCR  Enterovirus PCR   Arbovirus panel on CSF and serum--> state lab  (can also send our [place] Nile Virus antibody that will be done at our lab)  Histoplasma antigen   Blastomyces antigen  Coccidioides antibodies by Complement fixation and Immune diffusion  I would send CSF and serum for anti- NMDA antibodies (would discuss with Neurology re other tests they might want for autoimmune encephalitis workup)  Consider CSF ACE level  Consider Human Herpes virus 6, 7 by PCR  Hopefully this CSF profile shows improvement such as reduced cell  count and higher glucose than on his initial lumbar puncture  Would consider reconsulting Neurology for additional input and t ransfer to tertiary care center is not unreasonable either.   I DO think his prognosis is very poor .  I spent greater than 35  minutes with the patient including greater than 50% coordinating his care with primary team and Nephrology.  Dr. Drue Second to take over the case tomorrow  ADDENDUM:  I SPOKE WITH LAB AND THE VZV PCR WAS NEVER SENT DUE TO CLAIM  THAT WE DID NOT GIVE PRIORITY ON CSF TO BE SENT  I ACTUALLY HAD DONE SO AND HAD GIVEN PRIORITY TO VZV PCR as #1 at that time  There is NOT enough to do the VZV and the MTB PCRs both since the latter would take up the full ML  I am asking them to run the VZV since this sample was collected prior to intiation of acyclovir. When it comes back negative for VZV PCR we can dc the acyclovir.  I have called [facility] phone # [phone] account # [id number] re the VDRL result.    ADDENDUM #2 [facility] HAD THE CSF FOR VDRL SENT TO MOLECULAR WHEN IT SHOULD HAVE BEEN SENT TO SEROLOGY SO CSF VDRL HAS NEVER BEEN SET UP> SHOULD BE SET UP AND RUN IN NEXT FEW DAYS  WOULD RECOMMEND CALL BACK AT ABOVE NUMBER> I DOUbt this patient has NS with RPR NR to 1:1  LOS: 11 days   Acey Lav 05/19/2016, 12:45 PM

## 2016-05-20 ENCOUNTER — Encounter (HOSPITAL_COMMUNITY): Payer: Self-pay

## 2016-05-20 ENCOUNTER — Inpatient Hospital Stay (HOSPITAL_COMMUNITY): Payer: Medicaid Other

## 2016-05-20 DIAGNOSIS — G253 Myoclonus: Secondary | ICD-10-CM

## 2016-05-20 LAB — GLUCOSE, CAPILLARY
GLUCOSE-CAPILLARY: 145 mg/dL — AB (ref 65–99)
Glucose-Capillary: 150 mg/dL — ABNORMAL HIGH (ref 65–99)
Glucose-Capillary: 104 mg/dL — ABNORMAL HIGH (ref 65–99)

## 2016-05-20 LAB — PSA: PSA: 37.75 ng/mL — ABNORMAL HIGH (ref 0.00–4.00)

## 2016-05-20 LAB — CSF CELL COUNT WITH DIFFERENTIAL
EOS CSF: 0 % (ref 0–1)
Lymphs, CSF: 96 % — ABNORMAL HIGH (ref 40–80)
Monocyte-Macrophage-Spinal Fluid: 4 % — ABNORMAL LOW (ref 15–45)
RBC Count, CSF: 44 /mm3 — ABNORMAL HIGH
Segmented Neutrophils-CSF: 0 % (ref 0–6)
TUBE #: 3
WBC, CSF: 268 /mm3 (ref 0–5)

## 2016-05-20 LAB — CBC
HEMATOCRIT: 31.7 % — AB (ref 39.0–52.0)
Hemoglobin: 10.4 g/dL — ABNORMAL LOW (ref 13.0–17.0)
MCH: 30.1 pg (ref 26.0–34.0)
MCHC: 32.8 g/dL (ref 30.0–36.0)
MCV: 91.6 fL (ref 78.0–100.0)
Platelets: 200 10*3/uL (ref 150–400)
RBC: 3.46 MIL/uL — ABNORMAL LOW (ref 4.22–5.81)
RDW: 15.7 % — AB (ref 11.5–15.5)
WBC: 9 10*3/uL (ref 4.0–10.5)

## 2016-05-20 LAB — RENAL FUNCTION PANEL
Albumin: 3 g/dL — ABNORMAL LOW (ref 3.5–5.0)
Anion gap: 12 (ref 5–15)
BUN: 49 mg/dL — AB (ref 6–20)
CHLORIDE: 96 mmol/L — AB (ref 101–111)
CO2: 28 mmol/L (ref 22–32)
Calcium: 10.8 mg/dL — ABNORMAL HIGH (ref 8.9–10.3)
Creatinine, Ser: 7.21 mg/dL — ABNORMAL HIGH (ref 0.61–1.24)
GFR calc Af Amer: 8 mL/min — ABNORMAL LOW (ref >60)
GFR calc non Af Amer: 7 mL/min — ABNORMAL LOW (ref >60)
GLUCOSE: 189 mg/dL — AB (ref 65–99)
Phosphorus: 4 mg/dL (ref 2.5–4.6)
Potassium: 4.6 mmol/L (ref 3.5–5.1)
Sodium: 136 mmol/L (ref 135–145)

## 2016-05-20 LAB — VARICELLA-ZOSTER BY PCR

## 2016-05-20 LAB — CULTURE, BLOOD (ROUTINE X 2)

## 2016-05-20 LAB — MTB NAA WITHOUT AFB CULTURE

## 2016-05-20 LAB — PROTEIN, CSF: Total  Protein, CSF: 193 mg/dL — ABNORMAL HIGH (ref 15–45)

## 2016-05-20 LAB — GLUCOSE, CSF: Glucose, CSF: 83 mg/dL — ABNORMAL HIGH (ref 40–70)

## 2016-05-20 LAB — VDRL, CSF: SYPHILIS VDRL QUANT CSF: NONREACTIVE

## 2016-05-20 MED ORDER — VANCOMYCIN HCL IN DEXTROSE 750-5 MG/150ML-% IV SOLN
INTRAVENOUS | Status: AC
  Administered 2016-05-20: 750 mg via INTRAVENOUS
  Filled 2016-05-20: qty 150

## 2016-05-20 MED ORDER — DOXERCALCIFEROL 4 MCG/2ML IV SOLN
INTRAVENOUS | Status: AC
  Administered 2016-05-20: 6 ug via INTRAVENOUS
  Filled 2016-05-20: qty 4

## 2016-05-20 MED ORDER — LORAZEPAM 2 MG/ML IJ SOLN
2.0000 mg | Freq: Once | INTRAMUSCULAR | Status: AC
  Administered 2016-05-20: 2 mg via INTRAVENOUS
  Filled 2016-05-20: qty 1

## 2016-05-20 MED ORDER — LIDOCAINE HCL (PF) 1 % IJ SOLN
5.0000 mL | Freq: Once | INTRAMUSCULAR | Status: AC
  Administered 2016-05-20: 5 mL via INTRADERMAL

## 2016-05-20 NOTE — Progress Notes (Signed)
Dialysis treatment completed.  1700 mL ultrafiltrated and net fluid removal 1000 mL.    Patient status unchanged. Lung sounds diminished to ausculation in all fields. No edema. Cardiac: NSR.  Disconnected lines and removed needles.  Pressure held for 10 minutes and band aid/gauze dressing applied.  Report given to bedside RN, Seward GraterMaggie.

## 2016-05-20 NOTE — Progress Notes (Signed)
Patient arrived to unit per bed.  Reviewed treatment plan and this RN agrees.  Report received from bedside RN, Seward GraterMaggie.  Consent verified.  Patient lethargic, responds to pain. Lung sounds diminished to ausculation in all fields. No edema. Cardiac: NSR.  Prepped LUAVF with alcohol and cannulated with two 15 gauge needles.  Pulsation of blood noted.  Flushed access well with saline per protocol.  Connected and secured lines and initiated tx at 1337.  UF goal of 3500 mL and net fluid removal of 3000 mL.  Will continue to monitor.

## 2016-05-20 NOTE — Progress Notes (Signed)
Subjective:  Patient nonverbal this morning. Overnight, he became agitated and restless and was trying to get out of bed. He was unable to be redirected. Restraint orders were placed. We will reassess the necessity of these today. While observing the patient he had multiple episodes of myoclonic jerks.  Objective:  Vital signs in last 24 hours: Vitals:   05/20/16 0200 05/20/16 0400 05/20/16 0500 05/20/16 0542  BP: (!) 199/105 (!) 177/83  (!) 167/92  Pulse: 78   95  Resp: 12 14  (!) 22  Temp:  97.3 F (36.3 C)    TempSrc:  Oral    SpO2: 100% 99%  99%  Weight:   162 lb 7.7 oz (73.7 kg)   Height:       Physical Exam  Constitutional:  Resting in bed. Restraints on. Several myoclonic jerks noted.  Cardiovascular: Normal rate and regular rhythm.   Murmur heard. Holosystolic murmur heard best at the right upper sternal border  Respiratory: Effort normal and breath sounds normal. No respiratory distress. He has no wheezes.  GI: Soft. Bowel sounds are normal. He exhibits no distension.  Musculoskeletal: He exhibits no edema.  Neurological:  Patient a verbal today. Myoclonic jerks noted. Patient moves all 4 extremities spontaneously. Patient opens his eyes spontaneously and to his name.     Assessment/Plan:  Principal Problem:   Meningoencephalitis Active Problems:   Type 2 diabetes mellitus (HCC)   Hypertension, renal disease   ESRD on hemodialysis (HCC)   Labile blood glucose   Anemia of chronic disease   Hyperglycemia   Stroke (cerebrum) (HCC)   History of diabetes mellitus   Meningitis   Elevated serum creatinine   Encephalopathy   Hypothermia   Hypoglycemia   MRSA bacteremia  Mr.Mando Anane-Brobbeyis a 71 y.o.malewith HTN, DM, ESRD on HD, who presents w/ 5 day h/o altered mental status of unclear etiology.  # Meningoencephalitis  Patient's CSF profile as following: 208 white blood cells with a lymphocytic predominance, decreased glucose at 36 and increased  protein at 241. Etiology remains unclear at this time. Infectious disease has recommended a repeat lumbar puncture with a large volume of CSF removed, however this was unsuccessful. 1/2 Blood Cultures is positive for MRSA. For now we will continue the patient on acyclovir and vancomycin, d/c Cefepime per ID recs. CT chest/abd/pelvis showed several hepatic cysts, prostatomegaly, and changes suggestive of bladder outlet obstruction. We will still likely need large volume lumbar puncture. We may try to arrange this today with some form of sedation for the patient. Observing him this morning I think his myoclonic jerks and spontaneous movement of his extremities would make obtaining a lumbar puncture extremely difficult without some form of sedation. Additionally, we will have a new infectious disease specialist on today and we will see if they have new or different recommendations. Lastly, given the lack of input from our neurology consultants on this case we may still considered transfer to higher level of care at an academic institution for ongoing evaluation. Discussions with the family were started over the weekend and hopefully we will know from them if this is something they would like to proceed with or not. - F/U blood cultures - 1 of 2 MRSA positive - RPR- positive one-to-one serum, positive antibody - Follow up VDRL CSF - HSV 1 and 2, CMV, JC virus negative - Continue Vancomycin, Acyclovir - Appreciate infectious disease recommendations - Will try for repeat IR guided LP tomorrow prior to HD - Nephrology notified -  [facility]   # MRSA Bacteremia: 1 of 2 blood cultures drawn 05/16/16 positive. Blood cultures at presentation were negative. Repeat blood cultures have been obtained on 05/19/2016. No evidence of infective endocarditis on physical examination or neuro imaging thus far. We'll continue vancomycin for now. -- Vancomycin -- Follow-up blood cultures  #  Hypothermia Tests not suggestive of adrenal insufficiency or thyroid dysfunction.Still hypothermic. Continue with [employer] -- TSH, free T4, free T3 are within normal limits -- Cortisol 13.0 -- Continue vancomycin  # Hematuria UA with moderate leukocytes, negative nitrites, 6-30 WBCs, 6-30 RBCs/HPF -- urine culture no growth  # IDDM Patient has labile and difficult to control glucose. - A1c 12.3 (05/08/16) - SSI-S - Levemir 6 units qhs  # Hypertension - HD per Nephrology - Hydralazine 100 mg po TID - Labetalol 300 mg twice a day - Hydralazine PRN BP > 185/110. - Consider adding alpha-blocker   # ESRD on MWF HD: - Continue HD recommendations per nephrology  DVT/PE prophylaxis: Heparin FEN/GI:Carbmodified  Dispo: Anticipated discharge pending clinical course.   Thomasene LotJames Abbas Beyene, MD 05/20/2016, 8:04 AM

## 2016-05-20 NOTE — Progress Notes (Signed)
PT Cancellation Note  Patient Details Name: Michael BookmanJohn Dorsey MRN: [id number] DOB: [birth year]   Cancelled Treatment:    Reason Eval/Treat Not Completed: Patient at procedure or test/unavailable (pt in HD)   Angelina OkCary W [facility] LPMaycok 05/20/2016, 1:46 PM [facility] Ady Heimann PT [phone]

## 2016-05-20 NOTE — Progress Notes (Signed)
CRITICAL VALUE ALERT  Critical value received:  Spinal fluid total WBC 268  Date of notification:  05/20/16  Time of notification:  14:02  Critical value read back: yes   Nurse who received alert:  Maggie  MD notified (1st page):  FMTS  Time of first page:  14:03  Responding MD:  FMTS  Time MD responded:  14:05

## 2016-05-20 NOTE — Progress Notes (Signed)
Subjective: Interval History: still confused, no coop  Objective: Vital signs in last 24 hours: Temp:  [93.6 F (34.2 C)-97.4 F (36.3 C)] 97.3 F (36.3 C) (02/19 0400) Pulse Rate:  [77-95] 95 (02/19 0542) Resp:  [12-22] 22 (02/19 0542) BP: (138-199)/(70-105) 167/92 (02/19 0542) SpO2:  [99 %-100 %] 99 % (02/19 0542) Weight:  [73.7 kg (162 lb 7.7 oz)] 73.7 kg (162 lb 7.7 oz) (02/19 0500) Weight change: 1.1 kg (2 lb 6.8 oz)  Intake/Output from previous day: 02/18 0701 - 02/19 0700 In: 564.6 [IV Piggyback:564.6] Out: 200 [Urine:200] Intake/Output this shift: No intake/output data recorded.  General appearance: no distress, uncooperative and no interaction, responds to pain Resp: clear to auscultation bilaterally Cardio: S1, S2 normal and systolic murmur: systolic ejection 2/6, decrescendo at 2nd left intercostal space GI: soft, non-tender; bowel sounds normal; no masses,  no organomegaly Extremities: AVF LUA  Lab Results:  Recent Labs  05/18/16 0232 05/20/16 0350  WBC 7.7 9.0  HGB 9.6* 10.4*  HCT 29.7* 31.7*  PLT 169 200   BMET:  Recent Labs  05/20/16 0350  NA 136  K 4.6  CL 96*  CO2 28  GLUCOSE 189*  BUN 49*  CREATININE 7.21*  CALCIUM 10.8*   No results for input(s): PTH in the last 72 hours. Iron Studies: No results for input(s): IRON, TIBC, TRANSFERRIN, FERRITIN in the last 72 hours.  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 EXAM: CT CHEST, ABDOMEN, AND PELVIS WITH CONTRAST TECHNIQUE: Multidetector CT imaging of the chest, abdomen and pelvis was performed following the standard protocol during bolus administration of intravenous contrast. CONTRAST:  1 ISOVUE-300 IOPAMIDOL (ISOVUE-300) INJECTION 61% COMPARISON:  CT the abdomen and pelvis 09/21/2013. FINDINGS: CT CHEST FINDINGS Cardiovascular: Heart size is enlarged with left ventricular hypertrophy. There is no  significant pericardial fluid, thickening or pericardial calcification. There is aortic atherosclerosis, as well as atherosclerosis of the great vessels of the mediastinum and the coronary arteries, including calcified atherosclerotic plaque in the left main, left anterior descending and right coronary arteries. Severe calcifications of the aortic valve. Mediastinum/Nodes: No pathologically enlarged mediastinal or hilar lymph nodes. Esophagus is unremarkable in appearance. No axillary lymphadenopathy. Lungs/Pleura: Calcified granuloma in the left lower lobe. No other suspicious appearing pulmonary nodules or masses. Small focus of consolidation in the inferior segment of the lingula. Lungs are otherwise well aerated. No pleural effusions. Musculoskeletal: There are no aggressive appearing lytic or blastic lesions noted in the visualized portions of the skeleton. CT ABDOMEN PELVIS FINDINGS Hepatobiliary: Well-defined low-attenuation liver lesions similar to prior examinations, compatible with cysts, largest of which measures 2.3 cm between segments 2 and 4A, slightly smaller than the prior study, and demonstrating intermediate attenuation, presumably an involuting mildly proteinaceous cyst. No suspicious hepatic lesions are noted. No intra or extrahepatic biliary ductal dilatation. Gallbladder is normal in appearance. Pancreas: No pancreatic mass. No pancreatic ductal dilatation. No pancreatic or peripancreatic fluid or inflammatory changes. Spleen: Small linear low-attenuation region in the posterior aspect of the spleen likely reflects a small infarct or scar. Spleen is otherwise unremarkable in appearance. Adrenals/Urinary Tract: Status post right nephrectomy. Multifocal cortical thinning in the left kidney. subcentimeter low-attenuation lesions in the left kidney, too small to characterize, but statistically likely to represent tiny cysts. No left-sided hydroureteronephrosis. Urinary bladder demonstrates some  circumferential wall thickening, suggestive of bladder outlet obstruction. Bilateral adrenal glands are normal in appearance. Stomach/Bowel: The appearance of the stomach  is normal. There is no pathologic dilatation of small bowel or colon. The appendix is not confidently identified and may be surgically absent. Regardless, there are no inflammatory changes noted adjacent to the cecum to suggest the presence of an acute appendicitis at this time. Vascular/Lymphatic: Aortic atherosclerosis, without evidence of aneurysm or dissection in the abdominal or pelvic vasculature. No lymphadenopathy noted in the abdomen or pelvis. Reproductive: Prostate gland is enlarged with median lobe hypertrophy. Prostate gland overall measures 5.8 x 5.2 x 6.7 cm. Seminal vesicles are unremarkable in appearance. Other: No significant volume of ascites.  No pneumoperitoneum. Musculoskeletal: There are no aggressive appearing lytic or blastic lesions noted in the visualized portions of the skeleton. IMPRESSION: 1. Small focus of consolidation in the inferior segment of the lingula concerning for a small focus of pneumonia. 2. No other acute findings are noted elsewhere in the chest, abdomen or pelvis. 3. Prostatomegaly with median lobe hypertrophy in the prostate gland. This is associated with some circumferential thickening of the urinary bladder which suggests bladder outlet obstruction. 4. Cardiomegaly with left ventricular hypertrophy. 5. There are calcifications of the aortic valve. Echocardiographic correlation for evaluation of potential valvular dysfunction may be warranted if clinically indicated. 6. Aortic atherosclerosis, in addition to left main and 2 vessel coronary artery disease. Assessment for potential risk factor modification, dietary therapy or pharmacologic therapy may be warranted, if clinically indicated. 7. Additional incidental findings, as above. Electronically Signed   By: Trudie Reed M.D.   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 EXAM: CT CHEST, ABDOMEN, AND PELVIS WITH CONTRAST TECHNIQUE: Multidetector CT imaging of the chest, abdomen and pelvis was performed following the standard protocol during bolus administration of intravenous contrast. CONTRAST:  1 ISOVUE-300 IOPAMIDOL (ISOVUE-300) INJECTION 61% COMPARISON:  CT the abdomen and pelvis 09/21/2013. FINDINGS: CT CHEST FINDINGS Cardiovascular: Heart size is enlarged with left ventricular hypertrophy. There is no significant pericardial fluid, thickening or pericardial calcification. There is aortic atherosclerosis, as well as atherosclerosis of the great vessels of the mediastinum and the coronary arteries, including calcified atherosclerotic plaque in the left main, left anterior descending and right coronary arteries. Severe calcifications of the aortic valve. Mediastinum/Nodes: No pathologically enlarged mediastinal or hilar lymph nodes. Esophagus is unremarkable in appearance. No axillary lymphadenopathy. Lungs/Pleura: Calcified granuloma in the left lower lobe. No other suspicious appearing pulmonary nodules or masses. Small focus of consolidation in the inferior segment of the lingula. Lungs are otherwise well aerated. No pleural effusions. Musculoskeletal: There are no aggressive appearing lytic or blastic lesions noted in the visualized portions of the skeleton. CT ABDOMEN PELVIS FINDINGS Hepatobiliary: Well-defined low-attenuation liver lesions similar to prior examinations, compatible with cysts, largest of which measures 2.3 cm between segments 2 and 4A, slightly smaller than the prior study, and demonstrating intermediate attenuation, presumably an involuting mildly proteinaceous cyst. No suspicious hepatic lesions are noted. No intra or extrahepatic biliary ductal dilatation. Gallbladder is normal in appearance. Pancreas: No pancreatic mass.  No pancreatic ductal dilatation. No pancreatic or peripancreatic fluid or inflammatory changes. Spleen: Small linear low-attenuation region in the posterior aspect of the spleen likely reflects a small infarct or scar. Spleen is otherwise unremarkable in appearance. Adrenals/Urinary Tract: Status post right nephrectomy. Multifocal cortical thinning in the left kidney. subcentimeter low-attenuation lesions in the left kidney, too small to characterize, but statistically likely to represent tiny cysts. No left-sided hydroureteronephrosis. Urinary bladder demonstrates some  circumferential wall thickening, suggestive of bladder outlet obstruction. Bilateral adrenal glands are normal in appearance. Stomach/Bowel: The appearance of the stomach is normal. There is no pathologic dilatation of small bowel or colon. The appendix is not confidently identified and may be surgically absent. Regardless, there are no inflammatory changes noted adjacent to the cecum to suggest the presence of an acute appendicitis at this time. Vascular/Lymphatic: Aortic atherosclerosis, without evidence of aneurysm or dissection in the abdominal or pelvic vasculature. No lymphadenopathy noted in the abdomen or pelvis. Reproductive: Prostate gland is enlarged with median lobe hypertrophy. Prostate gland overall measures 5.8 x 5.2 x 6.7 cm. Seminal vesicles are unremarkable in appearance. Other: No significant volume of ascites.  No pneumoperitoneum. Musculoskeletal: There are no aggressive appearing lytic or blastic lesions noted in the visualized portions of the skeleton. IMPRESSION: 1. Small focus of consolidation in the inferior segment of the lingula concerning for a small focus of pneumonia. 2. No other acute findings are noted elsewhere in the chest, abdomen or pelvis. 3. Prostatomegaly with median lobe hypertrophy in the prostate gland. This is associated with some circumferential thickening of the urinary bladder which suggests bladder  outlet obstruction. 4. Cardiomegaly with left ventricular hypertrophy. 5. There are calcifications of the aortic valve. Echocardiographic correlation for evaluation of potential valvular dysfunction may be warranted if clinically indicated. 6. Aortic atherosclerosis, in addition to left main and 2 vessel coronary artery disease. Assessment for potential risk factor modification, dietary therapy or pharmacologic therapy may be warranted, if clinically indicated. 7. Additional incidental findings, as above. Electronically Signed   By: Trudie Reed M.D.   On: 05/18/2016 21:58    I have reviewed the patient's current medications.  Assessment/Plan: 1 ESRD for HD in am..  Vol xs, lower 2 HTN lower vol, cont meds 3 Anemia esa 4 Confusion w/u ongoing, not clear etio,  Viral W/U, to get LP,  R/o fungus, syph 5 1 of 2 pos BC P HD, esa, LP, Vanco,     LOS: 12 days   Michael Dorsey L 05/20/2016,8:06 AM

## 2016-05-20 NOTE — Progress Notes (Signed)
2000: rounding RN callled after assessment of pt. Pt is non-verbal, responding to noxious stimuli when squeezing left shoulder but not crossing midline to remove stimuli. Difficulty managing secretions. Not following commands 2010: rounding RN arrived on unit, provided assessment recommendation to alert attending team. 2020: attending (strelow MD) returned page, they are aware of pt condition, and of recent findings of LP ( performed today 05/20/16). Suggested update old data ( head Ct, labwork, performing EEG early) No new orders at this time. Neurology team is not following apparently. 2040: family at bedside and updated of findings.

## 2016-05-20 NOTE — Progress Notes (Signed)
Subjective: Patient does not speak this morning. He only opens eyes when calling his name. Overnight he was agitated and restless so he had to be restrained.  Objective:  Vital signs in last 24 hours: Vitals:   05/20/16 0400 05/20/16 0500 05/20/16 0542 05/20/16 0800  BP: (!) 177/83  (!) 167/92 (!) 193/94  Pulse:   95   Resp: 14  (!) 22   Temp: 97.3 F (36.3 C)   (!) 95.2 F (35.1 C)  TempSrc: Oral   Rectal  SpO2: 99%  99% 100%  Weight:  73.7 kg (162 lb 7.7 oz)    Height:       Physical Exam  Constitutional: He appears well-developed and well-nourished.  Cardiovascular: Normal rate and regular rhythm.   Holosystolic murmur heard loudest at the RUSB  Pulmonary/Chest: Effort normal and breath sounds normal.  Abdominal: Soft. Bowel sounds are normal.  Musculoskeletal: He exhibits no edema.  Neurological:  Patient opens eyes to calling name, but does not follow commands. Myoclonic jerks present in all 4 extremities.    Assessment/Plan:  Principal Problem:   Meningoencephalitis Active Problems:   Type 2 diabetes mellitus (HCC)   Hypertension, renal disease   ESRD on hemodialysis (HCC)   Labile blood glucose   Anemia of chronic disease   Hyperglycemia   Stroke (cerebrum) (HCC)   History of diabetes mellitus   Meningitis   Elevated serum creatinine   Encephalopathy   Hypothermia   Hypoglycemia   MRSA bacteremia   71 y.o.malewith HTN, DM, ESRD on HD, who presents w/ 5 day h/o altered mental status secondary to unclear etiology  AMS 2/2 Meningoencephalitis of Unclear Etiology CSF studies thus far have revealed decreased glucose @ 36, increased total protein @, elevated WBC @208  w/98% lymphocytes. Gram stain is negative. Serum RPR was found to be weakly positive with a 1:1 titer. F/U T. Pall Abs were found to be positive. 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 We are awaiting CSF VDRL for confirmation. If positive we will treat patient with Pen G.  We are also concerned for viral etiology given CSF profile. Cryptococcal, Hep C, CMV, HSVand HIV were found to be negative. VDRL still pending. ID recommended a large volume LP for several more studies. We attempt to talk to the family today and discuss long term plans. We think transferring him to academic institution may help with evaluation. -- cont ASA -- f/u CSF cultures -- cont IV Acyclovir -- VDRL pending -- large volume LP today  -- f/u LP studies -- discuss long-term plans with family today -- [facility]  MRSA Bacteremia 1/2 patient's BCx came back MRSA positive. As described above, patient is more disoriented than hospital baseline. Unsure of etiology of bacteremia.  CT chest/abdomen/pelvis showed several hepatic cysts, BPH, and thickened bladder wall suggestive of outlet obstruction. Unsure of current infection source.  -- f/u blood cultures - 1/2 MRSA+ -- cont IV Vanc  Hypothermia Overnight on 2/16, patient became hypothermic, more disoriented and his blood sugar dropped to 62. Temperature was low as 93.4. ID was consulted during the event and patient was started on broad spectrum antibiotics for concern of possible sepsis. Does not meet sepsis criteria currently, but patient seems more confused from inpatient baseline. Thyroid function test came back WNL for TSH, and free T3 & T4.  Patient continues to use bear hugger. -- cont bear hugger  Hematuria Patient had episode of hematuria overnight 2/14. Patient deniedany pain with urination.  Current differential includes crystal nephropathy 2/2 Acyclovir vs. other underlying renal pathology. UA showed moderate leukocytes, negative nitrites, 6-30 WBC, and 6-30 RBCs/HPF. -- f/u UCx - NGTD  HTN --Clonidine patch if unable to tolerate orals -- cont home Hydralazine 50 mg po TID,  -- cont carvedilol 25 mg po BID --consider restarting Imdur and his blood pressures tolerate his carvedilol  dosage. --Hydralazine PRN BP >185/110. -- cont to monitor   ESRD  -- cont HD w/ recommendation from Nephro  T2DM A1C 12.3 (05/08/16). Patient's blood glucose has been difficult to control.  -- SSI -- Levemir 6 units QHS  Fluids: None Diet:Carb Modified DVT Prophylaxis: SQH Code Status: FULL   Dispo: Anticipated discharge pending clinical course  Caryn BeeJohn A Roberta Kelly, Medical Student 05/20/2016, 11:40 AM Pager: [phone]

## 2016-05-20 NOTE — Procedures (Signed)
I was present at this session.  I have reviewed the session itself and made appropriate changes.  HD via LUA AVF, bp decreased early, xs sedation, cut goal back and now stable. Access press ok.   Valton Schwartz L 2/19/20184:13 PM

## 2016-05-20 NOTE — Progress Notes (Signed)
Patient is HD, EEG will be completed 05-22-15 as schedule permits

## 2016-05-20 NOTE — Progress Notes (Signed)
Patient has become very restless and agitated. He is throwing his legs off the bed and is trying to get out of bed. He is unable to be redirected. MD notified and orders for restraints given. Bilateral wrist restraints applied along with soft waist belt. Will continue to monitor and notify MD as needed.

## 2016-05-20 NOTE — Progress Notes (Signed)
RN called regarding more lethargic than normal, not responding to noxious stimuli, and increase secretions. On arrival pt supine in bed with bilateral mitts on, skin warm and dry, lungs clear. Kathlene NovemberMike RN reports this is a change from the last two nights. Pt did say "huh" when called his name but did not open his eyes or follow commands. When holding eyes open pt would look in my direction and attempt to close eyes with any movement toward eyes. Pt resisting me when trying to hold up extremeities limbs. All extremities noted to have repetitive movement. Looking through notes it seems this has been patients behavior through out the day. Kathlene NovemberMike RN advised to page on call and update physician of recent findings. Plan for now is to continue to monitor. Kathlene NovemberMike RN encouraged to call RRT as needed.

## 2016-05-20 NOTE — Progress Notes (Signed)
[facility]    Date of Admission:  05/08/2016   Total days of antibiotics 6        Day 6 acyclovir        Day 3 vanco           ID: Michael Dorsey is a 71 y.o. male with ESRD, DM, admitted on 2/7 with encephalopathy, LP found to have lymphocytic predominance pleocytosis with CSF WBC 268, 96%L, high protein, low glucose, culture negative from CSF. Also found to have 1/4 bottles of blood cx for MRSA Principal Problem:   Meningoencephalitis Active Problems:   Type 2 diabetes mellitus (HCC)   Hypertension, renal disease   ESRD on hemodialysis (HCC)   Labile blood glucose   Anemia of chronic disease   Hyperglycemia   Stroke (cerebrum) (HCC)   History of diabetes mellitus   Meningitis   Elevated serum creatinine   Encephalopathy   Hypothermia   Hypoglycemia   MRSA bacteremia    Subjective: Remains encephalopathy  24hr event: underwent repeat LP which is essentially unchanged in profile from 6 days ago. Undergoing HD  ROS: lethargic, does not answer questions Medications:  . aspirin  81 mg Oral Daily  . calcium acetate  667 mg Oral TID WC  . darbepoetin (ARANESP) injection - DIALYSIS  60 mcg Intravenous Q Wed-HD  . doxercalciferol      . doxercalciferol  6 mcg Intravenous Q M,W,F-HD  . feeding supplement (PRO-STAT SUGAR FREE 64)  30 mL Oral BID  . ferric citrate  210 mg Oral TID WC  . heparin  5,000 Units Subcutaneous Q8H  . hydrALAZINE  100 mg Oral Q8H  . insulin aspart  0-9 Units Subcutaneous TID WC  . insulin detemir  6 Units Subcutaneous QHS  . labetalol  300 mg Oral BID  . multivitamin  1 tablet Oral QHS  . sodium chloride  200 mL Intravenous Q24H  . Vancomycin      . vancomycin  750 mg Intravenous Q M,W,F-HD    Objective: Vital signs in last 24 hours: Temp:  [95.2 F (35.1 C)-97.4 F (36.3 C)] 97.1 F (36.2 C) (02/19 1324) Pulse Rate:  [71-95] 71 (02/19 1600) Resp:  [12-22] 13 (02/19 1324) BP: (82-199)/(57-105) 113/63  (02/19 1600) SpO2:  [99 %-100 %] 99 % (02/19 1200) Weight:  [158 lb 8.2 oz (71.9 kg)-162 lb 7.7 oz (73.7 kg)] 158 lb 8.2 oz (71.9 kg) (02/19 1324) gen = lethargic (possibly due to sedation from LP) HEENT: no scleral icterus pulm = CTAB no W/C/R Cors= nl s1,s2, SM heard throughout abd = Soft, NTND Ext: trace edema Neuro - confusion  Lab Results  Recent Labs  05/18/16 0232 05/20/16 0350  WBC 7.7 9.0  HGB 9.6* 10.4*  HCT 29.7* 31.7*  NA  --  136  K  --  4.6  CL  --  96*  CO2  --  28  BUN  --  49*  CREATININE  --  7.21*   Liver Panel  Recent Labs  05/20/16 0350  ALBUMIN 3.0*    Microbiology: 2/18 blood cx ngtd 2/15 blood cx 1 in 4 bottles MRSA ( vanco nad cefepime started)  2/13 blood cx 2/2 sets NGTD  2/13 CSF cx NGTD (acyclovir started) 2/7 blood cx NGTD 2/12 RPR 1:1 2/13 VDRL CSF negative 2/13 HSV 1/2 negative 2/13 CMV negative 2/13 JC negative HIV negative CrAg negative Studies/Results: Ct Chest W Contrast  Result Date: 05/18/2016 CLINICAL DATA:  71 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 EXAM: CT CHEST, ABDOMEN, AND PELVIS WITH CONTRAST TECHNIQUE: Multidetector CT imaging of the chest, abdomen and pelvis was performed following the standard protocol during bolus administration of intravenous contrast. CONTRAST:  1 ISOVUE-300 IOPAMIDOL (ISOVUE-300) INJECTION 61% COMPARISON:  CT the abdomen and pelvis 09/21/2013. FINDINGS: CT CHEST FINDINGS Cardiovascular: Heart size is enlarged with left ventricular hypertrophy. There is no significant pericardial fluid, thickening or pericardial calcification. There is aortic atherosclerosis, as well as atherosclerosis of the great vessels of the mediastinum and the coronary arteries, including calcified atherosclerotic plaque in the left main, left anterior descending and right coronary arteries. Severe calcifications of the aortic valve. Mediastinum/Nodes: No pathologically enlarged mediastinal or hilar lymph nodes.  Esophagus is unremarkable in appearance. No axillary lymphadenopathy. Lungs/Pleura: Calcified granuloma in the left lower lobe. No other suspicious appearing pulmonary nodules or masses. Small focus of consolidation in the inferior segment of the lingula. Lungs are otherwise well aerated. No pleural effusions. Musculoskeletal: There are no aggressive appearing lytic or blastic lesions noted in the visualized portions of the skeleton. CT ABDOMEN PELVIS FINDINGS Hepatobiliary: Well-defined low-attenuation liver lesions similar to prior examinations, compatible with cysts, largest of which measures 2.3 cm between segments 2 and 4A, slightly smaller than the prior study, and demonstrating intermediate attenuation, presumably an involuting mildly proteinaceous cyst. No suspicious hepatic lesions are noted. No intra or extrahepatic biliary ductal dilatation. Gallbladder is normal in appearance. Pancreas: No pancreatic mass. No pancreatic ductal dilatation. No pancreatic or peripancreatic fluid or inflammatory changes. Spleen: Small linear low-attenuation region in the posterior aspect of the spleen likely reflects a small infarct or scar. Spleen is otherwise unremarkable in appearance. Adrenals/Urinary Tract: Status post right nephrectomy. Multifocal cortical thinning in the left kidney. subcentimeter low-attenuation lesions in the left kidney, too small to characterize, but statistically likely to represent tiny cysts. No left-sided hydroureteronephrosis. Urinary bladder demonstrates some circumferential wall thickening, suggestive of bladder outlet obstruction. Bilateral adrenal glands are normal in appearance. Stomach/Bowel: The appearance of the stomach is normal. There is no pathologic dilatation of small bowel or colon. The appendix is not confidently identified and may be surgically absent. Regardless, there are no inflammatory changes noted adjacent to the cecum to suggest the presence of an acute appendicitis at  this time. Vascular/Lymphatic: Aortic atherosclerosis, without evidence of aneurysm or dissection in the abdominal or pelvic vasculature. No lymphadenopathy noted in the abdomen or pelvis. Reproductive: Prostate gland is enlarged with median lobe hypertrophy. Prostate gland overall measures 5.8 x 5.2 x 6.7 cm. Seminal vesicles are unremarkable in appearance. Other: No significant volume of ascites.  No pneumoperitoneum. Musculoskeletal: There are no aggressive appearing lytic or blastic lesions noted in the visualized portions of the skeleton. IMPRESSION: 1. Small focus of consolidation in the inferior segment of the lingula concerning for a small focus of pneumonia. 2. No other acute findings are noted elsewhere in the chest, abdomen or pelvis. 3. Prostatomegaly with median lobe hypertrophy in the prostate gland. This is associated with some circumferential thickening of the urinary bladder which suggests bladder outlet obstruction. 4. Cardiomegaly with left ventricular hypertrophy. 5. There are calcifications of the aortic valve. Echocardiographic correlation for evaluation of potential valvular dysfunction may be warranted if clinically indicated. 6. Aortic atherosclerosis, in addition to left main and 2 vessel coronary artery disease. Assessment for potential risk factor modification, dietary therapy or pharmacologic therapy may be warranted, if clinically indicated. 7. Additional incidental findings, as above. Electronically Signed   By: Reuel Boomaniel  Entrikin M.D.   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 EXAM: CT CHEST, ABDOMEN, AND PELVIS WITH CONTRAST TECHNIQUE: Multidetector CT imaging of the chest, abdomen and pelvis was performed following the standard protocol during bolus administration of intravenous contrast. CONTRAST:  1 ISOVUE-300 IOPAMIDOL (ISOVUE-300) INJECTION 61% COMPARISON:  CT the abdomen  and pelvis 09/21/2013. FINDINGS: CT CHEST FINDINGS Cardiovascular: Heart size is enlarged with left ventricular hypertrophy. There is no significant pericardial fluid, thickening or pericardial calcification. There is aortic atherosclerosis, as well as atherosclerosis of the great vessels of the mediastinum and the coronary arteries, including calcified atherosclerotic plaque in the left main, left anterior descending and right coronary arteries. Severe calcifications of the aortic valve. Mediastinum/Nodes: No pathologically enlarged mediastinal or hilar lymph nodes. Esophagus is unremarkable in appearance. No axillary lymphadenopathy. Lungs/Pleura: Calcified granuloma in the left lower lobe. No other suspicious appearing pulmonary nodules or masses. Small focus of consolidation in the inferior segment of the lingula. Lungs are otherwise well aerated. No pleural effusions. Musculoskeletal: There are no aggressive appearing lytic or blastic lesions noted in the visualized portions of the skeleton. CT ABDOMEN PELVIS FINDINGS Hepatobiliary: Well-defined low-attenuation liver lesions similar to prior examinations, compatible with cysts, largest of which measures 2.3 cm between segments 2 and 4A, slightly smaller than the prior study, and demonstrating intermediate attenuation, presumably an involuting mildly proteinaceous cyst. No suspicious hepatic lesions are noted. No intra or extrahepatic biliary ductal dilatation. Gallbladder is normal in appearance. Pancreas: No pancreatic mass. No pancreatic ductal dilatation. No pancreatic or peripancreatic fluid or inflammatory changes. Spleen: Small linear low-attenuation region in the posterior aspect of the spleen likely reflects a small infarct or scar. Spleen is otherwise unremarkable in appearance. Adrenals/Urinary Tract: Status post right nephrectomy. Multifocal cortical thinning in the left kidney. subcentimeter low-attenuation lesions in the left kidney, too small to  characterize, but statistically likely to represent tiny cysts. No left-sided hydroureteronephrosis. Urinary bladder demonstrates some circumferential wall thickening, suggestive of bladder outlet obstruction. Bilateral adrenal glands are normal in appearance. Stomach/Bowel: The appearance of the stomach is normal. There is no pathologic dilatation of small bowel or colon. The appendix is not confidently identified and may be surgically absent. Regardless, there are no inflammatory changes noted adjacent to the cecum to suggest the presence of an acute appendicitis at this time. Vascular/Lymphatic: Aortic atherosclerosis, without evidence of aneurysm or dissection in the abdominal or pelvic vasculature. No lymphadenopathy noted in the abdomen or pelvis. Reproductive: Prostate gland is enlarged with median lobe hypertrophy. Prostate gland overall measures 5.8 x 5.2 x 6.7 cm. Seminal vesicles are unremarkable in appearance. Other: No significant volume of ascites.  No pneumoperitoneum. Musculoskeletal: There are no aggressive appearing lytic or blastic lesions noted in the visualized portions of the skeleton. IMPRESSION: 1. Small focus of consolidation in the inferior segment of the lingula concerning for a small focus of pneumonia. 2. No other acute findings are noted elsewhere in the chest, abdomen or pelvis. 3. Prostatomegaly with median lobe hypertrophy in the prostate gland. This is associated with some circumferential thickening of the urinary bladder which suggests bladder outlet obstruction. 4. Cardiomegaly with left ventricular hypertrophy. 5. There are calcifications of the aortic valve. Echocardiographic correlation for evaluation of potential valvular dysfunction may be warranted if clinically indicated. 6. Aortic atherosclerosis, in addition to left main and 2 vessel coronary artery disease. Assessment for potential risk factor modification, dietary therapy or  pharmacologic therapy may be warranted, if  clinically indicated. 7. Additional incidental findings, as above. Electronically Signed   By: Trudie Reed M.D.   On: 05/18/2016 21:58   Dg Fluoro Guide Lumbar Puncture  Result Date: 05/20/2016 CLINICAL DATA:  Meningo encephalitis EXAM: DIAGNOSTIC LUMBAR PUNCTURE UNDER FLUOROSCOPIC GUIDANCE FLUOROSCOPY TIME:  Fluoroscopy Time:  1 minutes 12 second Radiation Exposure Index (if provided by the fluoroscopic device): Number of Acquired Spot Images: 0 PROCEDURE: Informed consent was obtained from the patient prior to the procedure, including potential complications of headache, allergy, and pain. Informed written consent obtained by telephone with the patient's daughter. With the patient prone, the lower back was prepped with Betadine. 1% Lidocaine was used for local anesthesia. Lumbar puncture initially attempted L5-S1. Good needle position on AP and lateral views however no fluid obtained. Lumbar puncture was performed at the L4-5 level using a 20 gauge needle with return of clear CSF with an opening pressure of 12 cm water. 19 ml of CSF were obtained for laboratory studies. The patient tolerated the procedure well and there were no apparent complications. IMPRESSION: Successful lumbar puncture at L4-5. 19 mL of CSF were obtained for laboratory studies. Electronically Signed   By: Marlan Palau M.D.   On: 05/20/2016 12:36     Assessment/Plan: 71yo M with ESRD on HD, HTN, DM2, dementia admitted on encephalopathy since 2/7. Repeat studies have not found infectious source, and found to have MRSA in 1 of 4 bottles on HD #7.  MRSA bacteremia = only found in 1 of 4 bottles but truly 1 in 8 bottles since he had prior blood cx 2 days prior to the positive cultures. I think this is incidental. Not likely to be cause of presentation.treat for 2 wk with vancomycin with HD  Encephalopathy = he has repeat LP done to send off for other infectious causes such as fungal infection, HHV-6 and HHV-7, and enterovirus  panel. I wonder if should expand to non-infectious causes such as anti-NMDA R and paraneoplastic panel. I have added them on, but not sure if sufficient CSF at this time. Will check with lab. I would ask neurology to weigh back into the discussion since patient is unchanged/not improved despite prolonged hospitalization  Since hsv negative, will discontinue acyclovir, also no improvement despite receiving 6 days of treatment     Drue Second [facility] Cell: [phone] Pager: [phone]  05/20/2016, 4:07 PM

## 2016-05-21 ENCOUNTER — Inpatient Hospital Stay (HOSPITAL_COMMUNITY): Payer: Medicaid Other

## 2016-05-21 DIAGNOSIS — G0490 Encephalitis and encephalomyelitis, unspecified: Secondary | ICD-10-CM

## 2016-05-21 DIAGNOSIS — R319 Hematuria, unspecified: Secondary | ICD-10-CM

## 2016-05-21 DIAGNOSIS — N186 End stage renal disease: Secondary | ICD-10-CM

## 2016-05-21 LAB — RENAL FUNCTION PANEL
Albumin: 3.1 g/dL — ABNORMAL LOW (ref 3.5–5.0)
Anion gap: 14 (ref 5–15)
BUN: 28 mg/dL — AB (ref 6–20)
CALCIUM: 10.2 mg/dL (ref 8.9–10.3)
CO2: 27 mmol/L (ref 22–32)
CREATININE: 5.6 mg/dL — AB (ref 0.61–1.24)
Chloride: 94 mmol/L — ABNORMAL LOW (ref 101–111)
GFR calc non Af Amer: 9 mL/min — ABNORMAL LOW (ref >60)
GFR, EST AFRICAN AMERICAN: 11 mL/min — AB (ref >60)
GLUCOSE: 220 mg/dL — AB (ref 65–99)
Phosphorus: 4 mg/dL (ref 2.5–4.6)
Potassium: 4.1 mmol/L (ref 3.5–5.1)
SODIUM: 135 mmol/L (ref 135–145)

## 2016-05-21 LAB — CULTURE, BLOOD (ROUTINE X 2): CULTURE: NO GROWTH

## 2016-05-21 LAB — GLUCOSE, CAPILLARY
GLUCOSE-CAPILLARY: 148 mg/dL — AB (ref 65–99)
GLUCOSE-CAPILLARY: 71 mg/dL (ref 65–99)
GLUCOSE-CAPILLARY: 165 mg/dL — AB (ref 65–99)
Glucose-Capillary: 131 mg/dL — ABNORMAL HIGH (ref 65–99)
Glucose-Capillary: 206 mg/dL — ABNORMAL HIGH (ref 65–99)

## 2016-05-21 LAB — ACID FAST SMEAR (AFB, MYCOBACTERIA): Acid Fast Smear: NEGATIVE

## 2016-05-21 LAB — CBC
HCT: 33.9 % — ABNORMAL LOW (ref 39.0–52.0)
Hemoglobin: 10.8 g/dL — ABNORMAL LOW (ref 13.0–17.0)
MCH: 29.8 pg (ref 26.0–34.0)
MCHC: 31.9 g/dL (ref 30.0–36.0)
MCV: 93.6 fL (ref 78.0–100.0)
PLATELETS: 195 10*3/uL (ref 150–400)
RBC: 3.62 MIL/uL — AB (ref 4.22–5.81)
RDW: 16.8 % — AB (ref 11.5–15.5)
WBC: 9.2 10*3/uL (ref 4.0–10.5)

## 2016-05-21 LAB — MISC LABCORP TEST (SEND OUT): Labcorp test code: 9985

## 2016-05-21 LAB — PATHOLOGIST SMEAR REVIEW

## 2016-05-21 LAB — VDRL, CSF: SYPHILIS VDRL QUANT CSF: NONREACTIVE

## 2016-05-21 MED ORDER — HEPARIN SODIUM (PORCINE) 1000 UNIT/ML DIALYSIS
1000.0000 [IU] | INTRAMUSCULAR | Status: DC | PRN
  Filled 2016-05-21: qty 1

## 2016-05-21 MED ORDER — PENTAFLUOROPROP-TETRAFLUOROETH EX AERO: 1.0000 "application " | INHALATION_SPRAY | CUTANEOUS | Status: DC | PRN

## 2016-05-21 MED ORDER — ENSURE ENLIVE PO LIQD
237.0000 mL | Freq: Two times a day (BID) | ORAL | Status: DC
  Administered 2016-05-21 – 2016-05-28 (×9): 237 mL via ORAL

## 2016-05-21 MED ORDER — SODIUM CHLORIDE 0.9 % IV SOLN: 100.0000 mL | INTRAVENOUS | Status: DC | PRN

## 2016-05-21 MED ORDER — LIDOCAINE HCL (PF) 1 % IJ SOLN: 5.0000 mL | INTRAMUSCULAR | Status: DC | PRN

## 2016-05-21 MED ORDER — ALTEPLASE 2 MG IJ SOLR: 2.0000 mg | Freq: Once | INTRAMUSCULAR | Status: DC | PRN

## 2016-05-21 MED ORDER — HEPARIN SODIUM (PORCINE) 1000 UNIT/ML DIALYSIS
40.0000 [IU]/kg | Freq: Once | INTRAMUSCULAR | Status: DC
  Filled 2016-05-21: qty 3

## 2016-05-21 MED ORDER — LIDOCAINE-PRILOCAINE 2.5-2.5 % EX CREA
1.0000 "application " | TOPICAL_CREAM | CUTANEOUS | Status: DC | PRN
  Filled 2016-05-21: qty 5

## 2016-05-21 NOTE — Progress Notes (Signed)
[facility]    Date of Admission:  05/08/2016   Total days of antibiotics 7        Day 6 acyclovir        Day 4 vanco           ID: Michael Dorsey is a 71 y.o. male with ESRD, DM, admitted on 2/7 with encephalopathy, LP found to have lymphocytic predominance pleocytosis with CSF WBC 268, 96%L, high protein, low glucose, culture negative from CSF. Also found to have 1/4 bottles of blood cx for MRSA Principal Problem:   Meningoencephalitis Active Problems:   Type 2 diabetes mellitus (HCC)   Hypertension, renal disease   ESRD on hemodialysis (HCC)   Labile blood glucose   Anemia of chronic disease   Hyperglycemia   Stroke (cerebrum) (HCC)   History of diabetes mellitus   Meningitis   Elevated serum creatinine   Encephalopathy   Hypothermia   Hypoglycemia   MRSA bacteremia    Subjective: EEG did not show any epileptiforms. Remains encephalopathic, where he opens his eyes to name but does not respond to answers.   ROS:  does not answer questions Medications:  . aspirin  81 mg Oral Daily  . darbepoetin (ARANESP) injection - DIALYSIS  60 mcg Intravenous Q Wed-HD  . feeding supplement (ENSURE ENLIVE)  237 mL Oral BID BM  . feeding supplement (PRO-STAT SUGAR FREE 64)  30 mL Oral BID  . ferric citrate  210 mg Oral TID WC  . heparin  5,000 Units Subcutaneous Q8H  . hydrALAZINE  100 mg Oral Q8H  . insulin aspart  0-9 Units Subcutaneous TID WC  . insulin detemir  6 Units Subcutaneous QHS  . labetalol  300 mg Oral BID  . multivitamin  1 tablet Oral QHS  . vancomycin  750 mg Intravenous Q M,W,F-HD    Objective: Vital signs in last 24 hours: Temp:  [96.8 F (36 C)-99.2 F (37.3 C)] 97.7 F (36.5 C) (02/20 1151) Pulse Rate:  [71-97] 83 (02/20 1151) Resp:  [11-26] 15 (02/20 1151) BP: (94-191)/(60-98) 147/77 (02/20 1315) SpO2:  [98 %-100 %] 99 % (02/20 1151) Weight:  [156 lb 4.9 oz (70.9 kg)] 156 lb 4.9 oz (70.9 kg) (02/19 1737) gen = laying in  bed, eyes open tracking HEENT: no scleral icterus pulm = CTAB no W/C/R Cors= nl s1,s2, SEM heard throughout abd = Soft, NTND Ext: trace edema Neuro - confusion  Lab Results  Recent Labs  05/20/16 0350  WBC 9.0  HGB 10.4*  HCT 31.7*  NA 136  K 4.6  CL 96*  CO2 28  BUN 49*  CREATININE 7.21*   Liver Panel  Recent Labs  05/20/16 0350  ALBUMIN 3.0*    Microbiology: 2/18 blood cx ngtd 2/15 blood cx 1 in 4 bottles MRSA ( vanco nad cefepime started)  2/13 blood cx 2/2 sets NGTD  2/13 CSF cx NGTD (acyclovir started) 2/7 blood cx NGTD 2/12 RPR 1:1 2/13 VDRL CSF negative 2/13 HSV 1/2 negative 2/13 CMV negative 2/13 JC negative HIV negative CrAg negative Studies/Results: Dg Fluoro Guide Lumbar Puncture  Result Date: 05/20/2016 CLINICAL DATA:  Meningo encephalitis EXAM: DIAGNOSTIC LUMBAR PUNCTURE UNDER FLUOROSCOPIC GUIDANCE FLUOROSCOPY TIME:  Fluoroscopy Time:  1 minutes 12 second Radiation Exposure Index (if provided by the fluoroscopic device): Number of Acquired Spot Images: 0 PROCEDURE: Informed consent was obtained from the patient prior to the procedure, including potential complications of headache, allergy, and pain. Informed written  consent obtained by telephone with the patient's daughter. With the patient prone, the lower back was prepped with Betadine. 1% Lidocaine was used for local anesthesia. Lumbar puncture initially attempted L5-S1. Good needle position on AP and lateral views however no fluid obtained. Lumbar puncture was performed at the L4-5 level using a 20 gauge needle with return of clear CSF with an opening pressure of 12 cm water. 19 ml of CSF were obtained for laboratory studies. The patient tolerated the procedure well and there were no apparent complications. IMPRESSION: Successful lumbar puncture at L4-5. 19 mL of CSF were obtained for laboratory studies. Electronically Signed   By: Marlan Palau M.D.   On: 05/20/2016 12:36      Assessment/Plan: 71yo M with ESRD on HD, HTN, DM2, dementia admitted on encephalopathy since 2/7. Repeat studies have not found infectious source, and found to have MRSA in 1 of 4 bottles on HD #7.  MRSA bacteremia = only found in 1 of 4 bottles but truly 1 in 8 bottles since he had prior blood cx 2 days prior to the positive cultures. I think this is incidental. Not likely to be cause of presentation.treat for 2 wk with vancomycin with HD  Encephalopathy = he has repeat LP done to send off for other infectious causes such as fungal infection, HHV-6 and HHV-7, mTB pcr and enterovirus panel, as well as cytology for possible malignancy as cause of lymphocytic meningitis.   Since hsv negative, will discontinue acyclovir, also no improvement despite receiving 6 days of treatment   Drue [facility] Cell: [phone] Pager: [phone]  05/21/2016, 3:37 PM

## 2016-05-21 NOTE — Procedures (Signed)
ELECTROENCEPHALOGRAM REPORT  Date of Study: 05/21/2016  Patient's Name: Michael Dorsey MRN: [id number] Date of Birth: 1945-10-29  Referring Provider: Dr. Thomasene LotJames Taylor  Clinical History: This is a 71 year old man with altered mental status.  Medications: acetaminophen (TYLENOL) tablet 650 mg  aspirin chewable tablet 81 mg  Darbepoetin Alfa (ARANESP) injection 60 mcg  feeding supplement (PRO-STAT SUGAR FREE 64) liquid 30 mL  ferric citrate (AURYXIA) tablet 210 mg  heparin injection 5,000 Units  hydrALAZINE (APRESOLINE) injection 10 mg  hydrALAZINE (APRESOLINE) tablet 100 mg  insulin aspart (novoLOG) injection 0-9 Units  insulin detemir (LEVEMIR) injection 6 Units  labetalol (NORMODYNE) tablet 300 mg  multivitamin (RENA-VIT) tablet 1 tablet  vancomycin (VANCOCIN) IVPB 750 mg/150 ml premix   Technical Summary: A multichannel digital EEG recording measured by the international 10-20 system with electrodes applied with paste and impedances below 5000 ohms performed as portable with EKG monitoring in an lethargic and confused patient.  Hyperventilation and photic stimulation were not performed.  The digital EEG was referentially recorded, reformatted, and digitally filtered in a variety of bipolar and referential montages for optimal display.   Description: The patient is predominantly drowsy and asleep during the recording. He is noted to be lethargic and confused. There is no clear posterior dominant rhythm. The background consists of a large amount of diffuse 4-5 Hz theta and 2-3 Hz delta slowing, at times with triphasic-like waves seen. During drowsiness and sleep, there is an increase in theta and delta slowing of the background with poorly formed vertex waves and sleep spindles were seen.  Hyperventilation and photic stimulation were not performed.  There were no clear epileptiform discharges or electrographic seizures seen.    EKG lead was unremarkable.  Impression: This  predominantly drowsy and asleep EEG is abnormal due to moderate diffuse slowing of the background with occasional triphasic-like waves seen.  Clinical Correlation of the above findings indicates diffuse cerebral dysfunction that is non-specific in etiology and can be seen with hypoxic/ischemic injury, toxic/metabolic encephalopathies, or medication effect. Triphasic-like waves are typically seen with hepatic encephalopathy, but can be seen with other metabolic encephalopathies as well.  The absence of epileptiform discharges does not rule out a clinical diagnosis of epilepsy.  Clinical correlation is advised.   Patrcia DollyKaren Aquino, M.D.

## 2016-05-21 NOTE — Progress Notes (Signed)
We had a long conversation this afternoon with one of the patient's daughters and his wife. We discussed with them that the cause of his meningoencephalitis is unclear and that the majority of the testing we have done at this time has been unrevealing. We told them we are not sure what is causing him to be altered and we have done many tests to evaluate this cause. We also discussed with them that it was impossible to tell if his course will progress and he will recover or if he will continue to get sicker as he has been the last several days. Additionally, we discussed with the family that we would want to honor the patient's wishes in case he becomes sicker and would require more invasive interventions. We asked the family if they knew what his wishes were regarding advanced procedures or other life-sustaining measures. They did not have an answer for these questions at this time but said they would need a day or two to discuss with family to come up with a consensus. Lastly, we discussed with them the potential for transferring to an academic or more research oriented Center for ongoing management such [facility] or [facility]. They also requested more time to consider this option as well. In summary, we discussed with them that his prognosis may be poor and that he may not recover from his illness. They had several questions which were answered. We will have ongoing discussions with the family tomorrow and moving forward as we continue to search for an etiology and potential treatment for the patient's altered mental status.

## 2016-05-21 NOTE — Progress Notes (Signed)
Subjective: Interval History: has no complaint,.  Objective: Vital signs in last 24 hours: Temp:  [96.8 F (36 C)-99.2 F (37.3 C)] 99.2 F (37.3 C) (02/20 0735) Pulse Rate:  [71-97] 82 (02/20 0735) Resp:  [11-26] 13 (02/20 0735) BP: (82-191)/(57-98) 146/75 (02/20 0735) SpO2:  [98 %-100 %] 99 % (02/20 0735) Weight:  [70.9 kg (156 lb 4.9 oz)-71.9 kg (158 lb 8.2 oz)] 70.9 kg (156 lb 4.9 oz) (02/19 1737) Weight change: -1.8 kg (-3 lb 15.5 oz)  Intake/Output from previous day: 02/19 0701 - 02/20 0700 In: -  Out: 1000  Intake/Output this shift: No intake/output data recorded.  General appearance: no distress and opens eyes and acknowleges presence, but no coop,  Resp: diminished breath sounds bilaterally Cardio: S1, S2 normal and systolic murmur: systolic ejection 2/6, decrescendo at 2nd left intercostal space GI: soft, non-tender; bowel sounds normal; no masses,  no organomegaly Extremities: AVF LUA  Lab Results:  Recent Labs  05/20/16 0350  WBC 9.0  HGB 10.4*  HCT 31.7*  PLT 200   BMET:  Recent Labs  05/20/16 0350  NA 136  K 4.6  CL 96*  CO2 28  GLUCOSE 189*  BUN 49*  CREATININE 7.21*  CALCIUM 10.8*   No results for input(s): PTH in the last 72 hours. Iron Studies: No results for input(s): IRON, TIBC, TRANSFERRIN, FERRITIN in the last 72 hours.  Studies/Results: Dg Fluoro Guide Lumbar Puncture  Result Date: 05/20/2016 CLINICAL DATA:  Meningo encephalitis EXAM: DIAGNOSTIC LUMBAR PUNCTURE UNDER FLUOROSCOPIC GUIDANCE FLUOROSCOPY TIME:  Fluoroscopy Time:  1 minutes 12 second Radiation Exposure Index (if provided by the fluoroscopic device): Number of Acquired Spot Images: 0 PROCEDURE: Informed consent was obtained from the patient prior to the procedure, including potential complications of headache, allergy, and pain. Informed written consent obtained by telephone with the patient's daughter. With the patient prone, the lower back was prepped with Betadine. 1%  Lidocaine was used for local anesthesia. Lumbar puncture initially attempted L5-S1. Good needle position on AP and lateral views however no fluid obtained. Lumbar puncture was performed at the L4-5 level using a 20 gauge needle with return of clear CSF with an opening pressure of 12 cm water. 19 ml of CSF were obtained for laboratory studies. The patient tolerated the procedure well and there were no apparent complications. IMPRESSION: Successful lumbar puncture at L4-5. 19 mL of CSF were obtained for laboratory studies. Electronically Signed   By: Marlan Palauharles  Clark M.D.   On: 05/20/2016 12:36    I have reviewed the patient's current medications.  Assessment/Plan: 1 ESRD  For HD MWF. Did not tol vol off yest 2 Anemia stable 3 HPTH  ^ Ca, lower Ca in bath, stop po Ca, IV vit D 4 enceph, per ID and Neuro 5 DM controlled 6 HTN P HD, low Ca bath, stop iv VIT D and po Ca, await CSF studies and Neuro eval    LOS: 13 days   Esa Raden L 05/21/2016,8:03 AM

## 2016-05-21 NOTE — Progress Notes (Signed)
SLP Cancellation Note  Patient Details Name: Michael Dorsey MRN: [id number] DOB: [birth year]   Cancelled treatment:       Reason Eval/Treat Not Completed: Patient at procedure or test/unavailable   Zoi Devine, Riley NearingBonnie Caroline 05/21/2016, 9:35 AM

## 2016-05-21 NOTE — Progress Notes (Signed)
Subjective:  Patient again nonverbal this morning. Less myoclonic jerks were appreciated. He does arouse to sternal rub. Lumbar puncture was successful yesterday and fluid has been sent for additional tests. The family is coming to the hospital this afternoon and we'll have ongoing discussion regarding the patient's condition and goals of care.  Objective:  Vital signs in last 24 hours: Vitals:   05/21/16 0500 05/21/16 0600 05/21/16 0735 05/21/16 1151  BP: (!) 177/87 (!) 173/86 (!) 146/75 (!) 174/84  Pulse: 88 97 82 83  Resp: 18 14 13 15   Temp:   99.2 F (37.3 C) 97.7 F (36.5 C)  TempSrc:   Axillary Axillary  SpO2: 100% 98% 99% 99%  Weight:      Height:       Physical Exam  Constitutional:  Resting in bed. Restraints on. Several myoclonic jerks noted.  Cardiovascular: Normal rate and regular rhythm.   Murmur heard. Holosystolic murmur heard best at the right upper sternal border  Respiratory: Effort normal and breath sounds normal. No respiratory distress. He has no wheezes.  GI: Soft. Bowel sounds are normal. He exhibits no distension.  Musculoskeletal: He exhibits no edema.  Neurological:  Patient nonverbal. Response to sternal rub and opens eyes only momentarily. Does not respond to his name     Assessment/Plan:  Principal Problem:   Meningoencephalitis Active Problems:   Type 2 diabetes mellitus (HCC)   Hypertension, renal disease   ESRD on hemodialysis (HCC)   Labile blood glucose   Anemia of chronic disease   Hyperglycemia   Stroke (cerebrum) (HCC)   History of diabetes mellitus   Meningitis   Elevated serum creatinine   Encephalopathy   Hypothermia   Hypoglycemia   MRSA bacteremia  Michael Anane-Brobbeyis a 71 y.o.malewith HTN, DM, ESRD on HD, who presents w/ 5 day h/o altered mental status of unclear etiology.  # Meningoencephalitis  Patient's CSF profile as following: 208 white blood cells with a lymphocytic predominance, decreased glucose at  36 and increased protein at 241. Repeat lumbar puncture shows the following: 268 white blood cells with a lymphocytic predominance, elevated protein at 193 and elevated glucose at 83. The patient's metal status continues to deteriorate. Etiology remains unclear. I was called via a tech working with the pathologist that the cells from his lumbar puncture looked atypical and they have requested further cytology and flow cytometry which have been ordered. I am hopeful this may point to an etiology for the patient's meningeal encephalitis and give [place] a direction to continue with treatment. For now we plan to have discussions with the family this afternoon regarding his overall condition, goals of care and potential to transfer to an academic institution for ongoing management. - The patient has had 3 sets of blood cultures drawn. Only one has been positive for MRSA. The remainder of the blood cultures obtained show no growth to date. I suspect this single positive blood culture does not represent a true positive but given the patient's clinical status we will continue to treat with IV vancomycin for 2 weeks. - RPR- positive one-to-one serum, positive antibody - VDRL from CSF the first lumbar puncture negative - CSF cytology and flow cytometry, we'll await results from pathology - HSV 1 and 2, CMV, JC virus negative - Appreciate infectious disease recommendations - Consider transfer to [facility]   # MRSA Bacteremia: 1 of 2 blood cultures drawn 05/16/16 positive. Blood cultures at presentation were negative. Repeat blood cultures have been obtained on 05/19/2016.  No evidence of infective endocarditis on physical examination or neuro imaging thus far. Ultrasound of his fistula does not show fluid collection or evidence for infection. As stated above given the patient has had blood cultures drawn 3 times and only one of these has shown MRSA this most likely represents a contaminant. However, given his  clinical course we will continue with IV vancomycin and he will need treatment for 2 weeks for presumed MRSA bacteremia. -- Vancomycin -- Follow-up blood cultures  # Hypothermia Tests not suggestive of adrenal insufficiency or thyroid dysfunction. Currently euthermic. -- TSH, free T4, free T3 are within normal limits -- Cortisol 13.0 -- Continue vancomycin  # Hematuria UA with moderate leukocytes, negative nitrites, 6-30 WBCs, 6-30 RBCs/HPF -- urine culture no growth  # IDDM Patient has labile and difficult to control glucose. - A1c 12.3 (05/08/16) - SSI-S - Levemir 6 units qhs  # Hypertension Patient is currently not taking by mouth medications. We will need to work on IV formulations of his antihypertensive medications to ensure we have better control of his blood pressure. For now with when necessary hydralazine order as detailed below. - HD per Nephrology - Hydralazine 100 mg po TID - Labetalol 300 mg twice a day - Hydralazine PRN BP > 185/110. - Consider adding alpha-blocker   # ESRD on MWF HD: - Continue HD recommendations per nephrology  DVT/PE prophylaxis: Heparin FEN/GI:Carbmodified  Dispo: Anticipated discharge pending clinical course.   Thomasene LotJames Suesan Mohrmann, MD 05/21/2016, 12:43 PM

## 2016-05-21 NOTE — Progress Notes (Signed)
Subjective: Patient presentation is similar to yesterday. Will only respond to physical stimuli by opening his eyes. He is nonverbal. Will talk to family today about plans moving forward.   Objective:  Vital signs in last 24 hours: Vitals:   05/21/16 0400 05/21/16 0500 05/21/16 0600 05/21/16 0735  BP: (!) 145/82 (!) 177/87 (!) 173/86 (!) 146/75  Pulse: 85 88 97 82  Resp: 20 18 14 13   Temp:    99.2 F (37.3 C)  TempSrc:    Axillary  SpO2: 100% 100% 98% 99%  Weight:      Height:       Physical Exam  Constitutional: He appears well-developed and well-nourished.  Cardiovascular: Normal rate and regular rhythm.   Holosystolic murmur heard loudest at RUSB  Abdominal: Soft. Bowel sounds are normal.  Musculoskeletal: He exhibits no edema.  Neurological:  Patient is nonverbal. Patient only responds physical stimuli. No myoclonic jerks present this morning.    CSF  Ref Range & Units 1d ago  Tube #  3   Color, CSF COLORLESS COLORLESS   Appearance, CSF CLEAR CLEAR   Supernatant  NOT INDICATED   RBC Count, CSF 0 /cu mm 44    WBC, CSF 0 - 5 /cu mm 268    Comments: CRITICAL RESULT CALLED TO, READ BACK BY AND VERIFIED WITH:  BARBER,M RN @ 1402 05/20/16 YARBROUGH,S   Segmented Neutrophils-CSF 0 - 6 % 0   Lymphs, CSF 40 - 80 % 96    Monocyte-Macrophage-Spinal Fluid 15 - 45 % 4    Eosinophils, CSF 0 - 1 % 0    CBG (last 3)   Recent Labs  05/20/16 1812 05/20/16 2114 05/21/16 0738  GLUCAP 131* 104* 148*    Assessment/Plan:  Principal Problem:   Meningoencephalitis Active Problems:   Type 2 diabetes mellitus (HCC)   Hypertension, renal disease   ESRD on hemodialysis (HCC)   Labile blood glucose   Anemia of chronic disease   Hyperglycemia   Stroke (cerebrum) (HCC)   History of diabetes mellitus   Meningitis   Elevated serum creatinine   Encephalopathy   Hypothermia   Hypoglycemia   MRSA bacteremia   71 y.o.malewith HTN, DM, ESRD on HD, who presents w/ 5 day h/o  altered mental status secondary to unclear etiology  AMS 2/2 Meningoencephalitis of Unclear Etiology 71 Patient received a large volume LP yesterday. CSF studies thus far have revealed  glucose @ 83, increased total protein @ 193, elevated WBC @268  w/96% lymphocytes. Gram stain is negative. Serum RPR was found to be weakly positive with a 1:1 titer. F/U T. Pall Abs were found to be positive. Prior LP found Cryptococcal, Hep C, CMV, HSV, VDRL,and HIV  to be negative. We have multiple LP studies pending. Would like for Neurology to weigh in, as studies thus far have been negative and patient's symptoms seem to be worsening. We will attempt to talk to the family today and discuss long term plans. We think transferring him to academic institution might be better for evaluation of his underlying etiology.  -- cont ASA -- f/u CSF cultures -- cont IV Acyclovir -- f/u LP studies -- discuss long-term plans with family today -- consider [facility] to [facility]  Heart Murmur Patient has had a holosystolic murmur heard loudest at the RUSB since a few days after admission. Murmur has been getting progressively louder, with today being the loudest it has been. Patient had a TTE with admission. Ordered another echo to evaluate worsening  murmur. -- ordered echo  MRSA Bacteremia 1/6 patient's BCx came back MRSA positive. As described above, patient is more disoriented than hospital baseline. Unsure of etiology of bacteremia.  CT chest/abdomen/pelvis showed several hepatic cysts, BPH, and thickened bladder wall suggestive of outlet obstruction. Unsure of current infection source. Will treat for 2 weeks -- f/u blood cultures - 1/2 MRSA+  -- cont IV Vanc day 4/14  Hypothermia Overnight on 2/16, patient became hypothermic, more disoriented and his blood sugar dropped to 62. Temperature was low as 93.4. ID was consulted during the event and patient was started on broad spectrum antibiotics for concern of possible  sepsis. Does not meet sepsis criteria currently, but patient seems more confused from inpatient baseline. Thyroid function test came back WNL for TSH, and free T3 &T4.  -- cont bear hugger PRN  Hematuria Patient had episode of hematuria overnight 2/14. Patient deniedany pain with urination. Current differential includes crystal nephropathy 2/2 Acyclovir vs. other underlying renal pathology. UA showed moderate leukocytes, negative nitrites, 6-30 WBC, and 6-30 RBCs/HPF. -- f/u UCx - NGTD  HTN --Clonidine patch if unable to tolerate orals -- cont home Hydralazine 50 mg po TID,  -- cont carvedilol 25 mg po BID --consider restarting Imdur and his blood pressures tolerate his carvedilol dosage. --Hydralazine PRN BP >185/110. -- cont to monitor   ESRD  -- HD yesterday -- cont HD w/ recommendation from Nephro  T2DM A1C 12.3 (05/08/16). Patient's blood glucose has been difficult to control.  -- SSI -- Levemir 6 units QHS  Fluids: None Diet:Carb Modified DVT Prophylaxis: SQH Code Status: FULL  Dispo: Anticipated discharge pending clinical course   Caryn BeeJohn A Magon Croson, Medical Student 05/21/2016, 11:04 AM Pager: [phone]

## 2016-05-21 NOTE — Progress Notes (Signed)
71yo M with ESRD on meningoencephalitis, presented with AMS since 2/2. CSF infectious work up pending. In the differential for lymphocytic predominance pleocytosis includes malignancy. I have added cytology to CSF collected on 2/19

## 2016-05-21 NOTE — Progress Notes (Signed)
EEG Completed; Results Pending  

## 2016-05-21 NOTE — Progress Notes (Signed)
PT Cancellation Note  Patient Details Name: Michael BookmanJohn Anane-Brobbey MRN: [id number] DOB: 1945/11/01   Cancelled Treatment:    Reason Eval/Treat Not Completed: Patient at procedure or test/unavailable (EEG)   Fabio AsaDevon J Jennifermarie Franzen 05/21/2016, 9:30 AM

## 2016-05-21 NOTE — Progress Notes (Signed)
paper Restraint down time obtained for 05/20/16

## 2016-05-21 NOTE — Progress Notes (Signed)
OT Cancellation Note  Patient Details Name: Michael BookmanJohn Dorsey MRN: [id number] DOB: 06/05/1945   Cancelled Treatment:    Reason Eval/Treat Not Completed: Patient at procedure or test/ unavailable (Pt receiving continuous EEG.)  Evern BioMayberry, Keysha Damewood Lynn 05/21/2016, 9:27 AM  [phone]

## 2016-05-21 NOTE — Progress Notes (Signed)
Initial Nutrition Assessment  DOCUMENTATION CODES:   Not applicable  INTERVENTION:   Ensure Enlive po BID, each supplement provides 350 kcal and 20 grams of protein  Magic cup TID with meals, each supplement provides 290 kcal and 9 grams of protein  NUTRITION DIAGNOSIS:   Inadequate oral intake related to ESRD and altered mental status as evidenced by meal completion < 25%, moderate depletions of muscle mass in lower legs.  GOAL:   Patient will meet greater than or equal to 90% of their needs  MONITOR:   PO intake, Supplement acceptance, Labs, Weight trends  REASON FOR ASSESSMENT:   LOS, Low Braden    ASSESSMENT:   71yo M with ESRD on HD, HTN, DM2, dementia admitted for encephalopathy since 2/7. Found to have MRSA   Unable to talk to pt r/t altered mental status. Pt with ESRD on HD with last treatment 2/19. Spoke to RN, pt refusing to eat anything. Pt's family to bring food later today and try to encourage pt to eat. Pt not drinking prostat or taking multivitamin. Per chart, pt with many weight fluctuations but appear to be weight stable. RD will order supplements; try to encourage intake of meals and supplements.   Medications reviewed and include: aspirin, ferric citrate, heparin, insulin, MVI, vancomycin  Labs reviewed: Cl 96(L), BUN 49(H), creat 7.21(H), Ca 10.8(H) adj. 11.6(H), Alb 3.0(L)  BG- 189, AIC 12.3(H) 2/7  Nutrition-Focused physical exam completed. Findings are no fat depletion, moderate muscle depletion in lower legs, and no edema.   Diet Order:  Diet Carb Modified Fluid consistency: Thin; Room service appropriate? Yes  Skin:  Reviewed, no issues  Last BM:  2/19  Height:   Ht Readings from Last 1 Encounters:  05/08/16 5\' 10"  (1.778 m)    Weight:   Wt Readings from Last 1 Encounters:  05/20/16 156 lb 4.9 oz (70.9 kg)    Ideal Body Weight:  75.4 kg  BMI:  Body mass index is 22.43 kg/m.  Estimated Nutritional Needs:   Kcal:   1800-2100kcal/day   Protein:  85-99g/day   Fluid:  >1.8L/day  EDUCATION NEEDS:   No education needs identified at this time  Betsey Holidayasey Breken Nazari, RD, LDN Pager #[phone]- [phone]

## 2016-05-21 NOTE — Progress Notes (Signed)
*  PRELIMINARY RESULTS* Vascular Ultrasound Duplex Dialysis Access (AVF, AGV) has been completed.  Preliminary findings: Left HD access was evaluated and appears patent. No evidence of surrounding fluid.   Farrel DemarkJill Eunice, RDMS, RVT  05/21/2016, 12:18 PM

## 2016-05-22 ENCOUNTER — Inpatient Hospital Stay (HOSPITAL_COMMUNITY): Payer: Medicaid Other

## 2016-05-22 DIAGNOSIS — R011 Cardiac murmur, unspecified: Secondary | ICD-10-CM

## 2016-05-22 LAB — GLUCOSE, CAPILLARY
Glucose-Capillary: 190 mg/dL — ABNORMAL HIGH (ref 65–99)
Glucose-Capillary: 100 mg/dL — ABNORMAL HIGH (ref 65–99)
Glucose-Capillary: 230 mg/dL — ABNORMAL HIGH (ref 65–99)

## 2016-05-22 LAB — CBC
HEMATOCRIT: 32.8 % — AB (ref 39.0–52.0)
Hemoglobin: 10.7 g/dL — ABNORMAL LOW (ref 13.0–17.0)
MCH: 30.2 pg (ref 26.0–34.0)
MCHC: 32.6 g/dL (ref 30.0–36.0)
MCV: 92.7 fL (ref 78.0–100.0)
PLATELETS: 176 10*3/uL (ref 150–400)
RBC: 3.54 MIL/uL — ABNORMAL LOW (ref 4.22–5.81)
RDW: 16.7 % — AB (ref 11.5–15.5)
WBC: 7.7 10*3/uL (ref 4.0–10.5)

## 2016-05-22 LAB — VANCOMYCIN, RANDOM: VANCOMYCIN RM: 16

## 2016-05-22 LAB — RENAL FUNCTION PANEL
ALBUMIN: 3 g/dL — AB (ref 3.5–5.0)
Anion gap: 14 (ref 5–15)
BUN: 34 mg/dL — ABNORMAL HIGH (ref 6–20)
CALCIUM: 10.3 mg/dL (ref 8.9–10.3)
CO2: 26 mmol/L (ref 22–32)
CREATININE: 6.48 mg/dL — AB (ref 0.61–1.24)
Chloride: 96 mmol/L — ABNORMAL LOW (ref 101–111)
GFR calc Af Amer: 9 mL/min — ABNORMAL LOW (ref >60)
GFR, EST NON AFRICAN AMERICAN: 8 mL/min — AB (ref >60)
GLUCOSE: 175 mg/dL — AB (ref 65–99)
PHOSPHORUS: 4.1 mg/dL (ref 2.5–4.6)
Potassium: 3.7 mmol/L (ref 3.5–5.1)
SODIUM: 136 mmol/L (ref 135–145)

## 2016-05-22 LAB — ECHOCARDIOGRAM LIMITED
HEIGHTINCHES: 70 in
Weight: 2416.24 oz

## 2016-05-22 LAB — ENTEROVIRUS PCR: Enterovirus PCR: NEGATIVE

## 2016-05-22 MED ORDER — DARBEPOETIN ALFA 60 MCG/0.3ML IJ SOSY
PREFILLED_SYRINGE | INTRAMUSCULAR | Status: AC
  Filled 2016-05-22: qty 0.3

## 2016-05-22 MED ORDER — PENTAFLUOROPROP-TETRAFLUOROETH EX AERO: 1.0000 "application " | INHALATION_SPRAY | CUTANEOUS | Status: DC | PRN

## 2016-05-22 MED ORDER — SODIUM CHLORIDE 0.9 % IV SOLN: 100.0000 mL | INTRAVENOUS | Status: DC | PRN

## 2016-05-22 MED ORDER — HEPARIN SODIUM (PORCINE) 1000 UNIT/ML DIALYSIS: 1000.0000 [IU] | INTRAMUSCULAR | Status: DC | PRN

## 2016-05-22 MED ORDER — LIDOCAINE HCL (PF) 1 % IJ SOLN: 5.0000 mL | INTRAMUSCULAR | Status: DC | PRN

## 2016-05-22 MED ORDER — ALTEPLASE 2 MG IJ SOLR: 2.0000 mg | Freq: Once | INTRAMUSCULAR | Status: DC | PRN

## 2016-05-22 MED ORDER — LIDOCAINE-PRILOCAINE 2.5-2.5 % EX CREA: 1.0000 "application " | TOPICAL_CREAM | CUTANEOUS | Status: DC | PRN

## 2016-05-22 MED ORDER — VANCOMYCIN HCL IN DEXTROSE 750-5 MG/150ML-% IV SOLN
INTRAVENOUS | Status: AC
  Filled 2016-05-22: qty 150

## 2016-05-22 NOTE — Procedures (Signed)
I was present at this session.  I have reviewed the session itself and made appropriate changes.  HD via LUA AVF access press ok. bp 130s.  tol HD  Toma Erichsen L 2/21/201810:52 AM

## 2016-05-22 NOTE — Progress Notes (Signed)
PT Cancellation Note  Patient Details Name: Michael BookmanJohn Anane-Brobbey MRN: [id number] DOB: 04/21/1945   Cancelled Treatment:    Reason Eval/Treat Not Completed: Patient at procedure or test/unavailable. Pt at dialysis. PT will continue to f/u with pt as available.    Alessandra BevelsJennifer M Carry Weesner 05/22/2016, 11:01 AM

## 2016-05-22 NOTE — Progress Notes (Signed)
Pharmacy Antibiotic Note  Michael Dorsey is a 71 y.o. male admitted on 05/08/2016 with  r/o meningitis. Tmax 99.2. Hypothermic on Coca-ColaBear hugger. WBC 9.2. Meningoencephalitis. Random Vancomycin level 16  Plan: Continue Vancomycin 750 mg q HD MWF Monitor culture data, renal function and clinical course Target pre-HD vancomycin level 15-25 mcg/mL   Height: 5\' 10"  (177.8 cm) Weight: 156 lb 1.4 oz (70.8 kg) IBW/kg (Calculated) : 73  Temp (24hrs), Avg:98.1 F (36.7 C), Min:97.4 F (36.3 C), Max:99.2 F (37.3 C)   Recent Labs Lab 05/15/16 0730 05/15/16 0745 05/17/16 0726 05/17/16 0727 05/18/16 0232 05/20/16 0350 05/21/16 1758 05/22/16 0407  WBC  --  6.3 10.7*  --  7.7 9.0 9.2  --   CREATININE 6.31*  --   --  6.10*  --  7.21* 5.60*  --   VANCORANDOM  --   --   --   --   --   --   --  16    Estimated Creatinine Clearance: 11.9 mL/min (by C-G formula based on SCr of 5.6 mg/dL (H)).    No Known Allergies   Antimicrobials this admission:  Acyclovir 2/13>> Cefepime 2/15 >>2/18 Vanc 2/15 >>   Microbiology results:  2/18 BCx: In process 2/17 UCx: No  Growth Final 2/16 MRSA pcr>> negative 2/15 BCx >> MRSA 1/2 bottles  2/13 CSF>>negative, cryptococcal antigen neg, HSV 1 and 2 neg, CMV and JC neg 2/16 HIV>> negative 2/13 BC x 2>>ngtd 2/7 BCx: >>negative   Bailey MechEmily Stewart, PharmD PGY1 Pharmacy Resident Pager: [phone] 05/22/2016 7:29 AM

## 2016-05-22 NOTE — Progress Notes (Signed)
Subjective:  Patient improved this morning. He was following commands. He stated his name and that he was in the hospital. He is more interactive. He is no longer having myoclonic jerks. Toes are downgoing on exam. Overall, he is improved slightly in the interval compared with the prior 2 days.  Objective:  Vital signs in last 24 hours: Vitals:   05/22/16 0950 05/22/16 1000 05/22/16 1030 05/22/16 1100  BP: 132/73 126/75  (!) 105/57  Pulse: 78 80 78 80  Resp: 20 19 20 16   Temp:      TempSrc:      SpO2:      Weight:      Height:       Physical Exam  Constitutional:  Seen at dialysis  Cardiovascular: Normal rate and regular rhythm.   Murmur heard. Holosystolic murmur heard best at the right upper sternal border  Respiratory: Effort normal and breath sounds normal. No respiratory distress. He has no wheezes.  GI: Soft. Bowel sounds are normal. He exhibits no distension.  Musculoskeletal: He exhibits no edema.  Neurological:  Patient alert. Oriented to person and place. Following commands. Improved from prior.     Assessment/Plan:  Principal Problem:   Meningoencephalitis Active Problems:   Type 2 diabetes mellitus (HCC)   Hypertension, renal disease   ESRD on hemodialysis (HCC)   Labile blood glucose   Anemia of chronic disease   Hyperglycemia   Stroke (cerebrum) (HCC)   History of diabetes mellitus   Meningitis   Elevated serum creatinine   Encephalopathy   Hypothermia   Hypoglycemia   MRSA bacteremia  Michael Anane-Brobbeyis a 71 y.o.malewith HTN, DM, ESRD on HD, who presents w/ 5 day h/o altered mental status of unclear etiology.  # Meningoencephalitis  Patient's CSF profile as following: 208 white blood cells with a lymphocytic predominance, decreased glucose at 36 and increased protein at 241. Repeat lumbar puncture shows the following: 268 white blood cells with a lymphocytic predominance, elevated protein at 193 and elevated glucose at 83. The  etiology of his presentation is still non-defined. He was improved today from prior. My suspicion remains high for atypical viral cause. For now we will continue supportive care and ongoing discussions with the family. Hopefully, we will continue to see clinical improvement over the next several days. - VDRL from CSF the first lumbar puncture negative - CSF cytology and flow cytometry, we'll await results from pathology - HSV 1 and 2, CMV, JC virus negative - Appreciate infectious disease recommendations - [facility]   # MRSA Bacteremia: 1 of 2 blood cultures drawn 05/16/16 positive. Blood cultures at presentation were negative. Repeat blood cultures have been obtained on 05/19/2016. No evidence of infective endocarditis on physical examination or neuro imaging thus far. Ultrasound of his fistula does not show fluid collection or evidence for infection.Will need a minimum of 2 weeks of vancomycin for MRSA bacteremia. -- Vancomycin -- Follow-up blood cultures -- echocardiogram  # IDDM Patient has labile and difficult to control glucose. - A1c 12.3 (05/08/16) - SSI-S - Levemir 6 units qhs  # Hypertension Currently hypotensive but at dialysis. Continue with antihypertensive medications as detailed below. - HD per Nephrology - Hydralazine 100 mg po TID - Labetalol 300 mg twice a day - Hydralazine PRN BP > 185/110. - Consider adding alpha-blocker   # ESRD on MWF HD: - Continue HD recommendations per nephrology  DVT/PE prophylaxis: Heparin FEN/GI:Carbmodified  Dispo: Anticipated discharge pending clinical course.  Michael Lot, MD 05/22/2016, 11:11 AM

## 2016-05-22 NOTE — Progress Notes (Signed)
  Echocardiogram 2D Echocardiogram has been performed.  Delcie RochENNINGTON, Treshaun Carrico 05/22/2016, 3:46 PM

## 2016-05-22 NOTE — Progress Notes (Signed)
Subjective: Interval History: I"m fine Objective: Vital signs in last 24 hours: Temp:  [97.4 F (36.3 C)-98.3 F (36.8 C)] 97.4 F (36.3 C) (02/21 0315) Pulse Rate:  [47-119] 74 (02/21 0315) Resp:  [12-25] 23 (02/21 0315) BP: (125-196)/(71-109) 177/101 (02/21 0555) SpO2:  [94 %-100 %] 94 % (02/21 0315) Weight:  [70.8 kg (156 lb 1.4 oz)] 70.8 kg (156 lb 1.4 oz) (02/21 0500) Weight change: -1.1 kg (-2 lb 6.8 oz)  Intake/Output from previous day: 02/20 0701 - 02/21 0700 In: 60 [P.O.:60] Out: 100 [Urine:100] Intake/Output this shift: No intake/output data recorded.  General appearance: no distress and spoke this am, coop with some commands, does not answer questions consistently Resp: diminished breath sounds bilaterally Cardio: S1, S2 normal and systolic murmur: holosystolic 2/6, blowing at apex GI: soft, non-tender; bowel sounds normal; no masses,  no organomegaly Extremities: AVF LUA  Lab Results:  Recent Labs  05/20/16 0350 05/21/16 1758  WBC 9.0 9.2  HGB 10.4* 10.8*  HCT 31.7* 33.9*  PLT 200 195   BMET:  Recent Labs  05/20/16 0350 05/21/16 1758  NA 136 135  K 4.6 4.1  CL 96* 94*  CO2 28 27  GLUCOSE 189* 220*  BUN 49* 28*  CREATININE 7.21* 5.60*  CALCIUM 10.8* 10.2   No results for input(s): PTH in the last 72 hours. Iron Studies: No results for input(s): IRON, TIBC, TRANSFERRIN, FERRITIN in the last 72 hours.  Studies/Results: Dg Fluoro Guide Lumbar Puncture  Result Date: 05/20/2016 CLINICAL DATA:  Meningo encephalitis EXAM: DIAGNOSTIC LUMBAR PUNCTURE UNDER FLUOROSCOPIC GUIDANCE FLUOROSCOPY TIME:  Fluoroscopy Time:  1 minutes 12 second Radiation Exposure Index (if provided by the fluoroscopic device): Number of Acquired Spot Images: 0 PROCEDURE: Informed consent was obtained from the patient prior to the procedure, including potential complications of headache, allergy, and pain. Informed written consent obtained by telephone with the patient's daughter.  With the patient prone, the lower back was prepped with Betadine. 1% Lidocaine was used for local anesthesia. Lumbar puncture initially attempted L5-S1. Good needle position on AP and lateral views however no fluid obtained. Lumbar puncture was performed at the L4-5 level using a 20 gauge needle with return of clear CSF with an opening pressure of 12 cm water. 19 ml of CSF were obtained for laboratory studies. The patient tolerated the procedure well and there were no apparent complications. IMPRESSION: Successful lumbar puncture at L4-5. 19 mL of CSF were obtained for laboratory studies. Electronically Signed   By: Marlan Palauharles  Clark M.D.   On: 05/20/2016 12:36    I have reviewed the patient's current medications.  Assessment/Plan: 1 ESRD for HD, bp ^, lower vol 2 ^Ca low Ca bath 3 Anemia esa 4 HPTH follow 5 Encephalitis MS some better??  Cause in ? 6 DM controlled P Hd, esa, low Ca bath, tests pending    LOS: 14 days   Michael Dorsey 05/22/2016,8:05 AM

## 2016-05-22 NOTE — Progress Notes (Signed)
[facility]    Date of Admission:  05/08/2016   Total days of antibiotics 7        Day 6 acyclovir        Day 4 vanco           ID: Michael Dorsey is a 71 y.o. male with ESRD, DM, admitted on 2/7 with encephalopathy, LP found to have lymphocytic predominance pleocytosis with CSF WBC 268, 96%L, high protein, low glucose, culture negative from CSF. Also found to have 1/4 bottles of blood cx for MRSA Principal Problem:   Meningoencephalitis Active Problems:   Type 2 diabetes mellitus (HCC)   Hypertension, renal disease   ESRD on hemodialysis (HCC)   Labile blood glucose   Anemia of chronic disease   Hyperglycemia   Stroke (cerebrum) (HCC)   History of diabetes mellitus   Meningitis   Elevated serum creatinine   Encephalopathy   Hypothermia   Hypoglycemia   MRSA bacteremia    Subjective: Remarkably more alert today. He answers by name. He states that he wants to go home.   ROS:  Denies headache or fever Medications:  . aspirin  81 mg Oral Daily  . darbepoetin (ARANESP) injection - DIALYSIS  60 mcg Intravenous Q Wed-HD  . feeding supplement (ENSURE ENLIVE)  237 mL Oral BID BM  . feeding supplement (PRO-STAT SUGAR FREE 64)  30 mL Oral BID  . ferric citrate  210 mg Oral TID WC  . heparin  5,000 Units Subcutaneous Q8H  . hydrALAZINE  100 mg Oral Q8H  . insulin aspart  0-9 Units Subcutaneous TID WC  . insulin detemir  6 Units Subcutaneous QHS  . labetalol  300 mg Oral BID  . multivitamin  1 tablet Oral QHS  . vancomycin  750 mg Intravenous Q M,W,F-HD    Objective: Vital signs in last 24 hours: Temp:  [97 F (36.1 C)-98.3 F (36.8 C)] 97.5 F (36.4 C) (02/21 1600) Pulse Rate:  [47-119] 76 (02/21 1600) Resp:  [11-25] 11 (02/21 1600) BP: (85-196)/(50-109) 145/75 (02/21 1600) SpO2:  [94 %-100 %] 100 % (02/21 1600) Weight:  [151 lb 0.2 oz (68.5 kg)-156 lb 8.4 oz (71 kg)] 151 lb 0.2 oz (68.5 kg) (02/21 1345) gen = laying in bed, eyes open  tracking HEENT: no scleral icterus pulm = CTAB no W/C/R Cors= nl s1,s2, SEM heard throughout abd = Soft, NTND Ext: trace edema Neuro - moves all extremities, answers 1/2 of questions correctly  Lab Results  Recent Labs  05/21/16 1758 05/22/16 0958 05/22/16 1000  WBC 9.2  --  7.7  HGB 10.8*  --  10.7*  HCT 33.9*  --  32.8*  NA 135 136  --   K 4.1 3.7  --   CL 94* 96*  --   CO2 27 26  --   BUN 28* 34*  --   CREATININE 5.60* 6.48*  --    Liver Panel  Recent Labs  05/21/16 1758 05/22/16 0958  ALBUMIN 3.1* 3.0*    Microbiology: 2/18 blood cx ngtd 2/15 blood cx 1 in 4 bottles MRSA ( vanco nad cefepime started)  2/13 blood cx 2/2 sets NGTD  2/13 CSF cx NGTD (acyclovir started) 2/7 blood cx NGTD 2/12 RPR 1:1 2/13 VDRL CSF negative 2/13 HSV 1/2 negative 2/13 CMV negative 2/13 JC negative HIV negative CrAg negative Studies/Results: No results found.   Assessment/Plan: 71yo M with ESRD on HD, HTN, DM2, dementia admitted on encephalopathy  since 2/7. Repeat studies have not found infectious source, and found to have MRSA in 1 of 4 bottles on HD #7.   Encephalopathy = he has repeat LP done to send off for other infectious causes such as fungal infection, HHV-6 and HHV-7, mTB pcr and enterovirus panel, as well as cytology for possible malignancy vs mTB as cause of lymphocytic meningitis.   He is much improved today. Recommend to see if this improvement progresses tomorrow. Etiology of original encephalitis still unclear. Numerous studies pending.  MRSA bacteremia = only found in 1 of 4 bottles but truly 1 in 8 bottles since he had prior blood cx 2 days prior to the positive cultures. I think this is incidental. Not likely to be cause of presentation.treat for 2 wk with vancomycin with HD. Currently day 7 of 14 of treatment   Ara Grandmaison, [facility] Cell: [phone] Pager: [phone]  05/22/2016, 5:13 PM

## 2016-05-22 NOTE — Progress Notes (Signed)
OT Cancellation Note  Patient Details Name: Caesar BookmanJohn Anane-Brobbey MRN: [id number] DOB: 08-07-1945   Cancelled Treatment:    Reason Eval/Treat Not Completed: Patient at procedure or test/ unavailable (HD) Will follow.  Evern BioMayberry, Caius Silbernagel Lynn 05/22/2016, 11:02 AM  [phone]

## 2016-05-22 NOTE — Progress Notes (Signed)
  Speech Language Pathology Treatment: Cognitive-Linquistic  Patient Details Name: Michael BookmanJohn Dorsey MRN: [id number] DOB: 1946-03-02 Today's Date: 05/22/2016 Time: [id number] SLP Time Calculation (min) (ACUTE ONLY): 27 min  Assessment / Plan / Recommendation Clinical Impression  Pt seen in am upon arrival of am meal. Minimal verbal and tactile cues needed to awaken pt. Addressed cognitive linguistic goasl in setting of functional task, meal. Pt followed 1 step commands in about 75% of opportunities if the request involved visual context, such as "pick up your fork." However, when asked verbal only questions, such as "what is your name", or "do you want some more?" pt could not focus attention to exclusive auditory stimuli to consistently answer questions. With eye contact and repetition pt was able to respond to question in brief sentences and eventually made some requests regarding meal and self care. He fed himself his meal though he intermittently seemed to forget it was there until he saw it again. Overall, Pt is progressing but focused and sustained attention significantly impact working memory. Language is not specifically impaired, no aphasia identified and pt is able to understand and speak english. Will continue with cognitive goals.   HPI HPI: Pt is a 71 y.o. male with a PMH of end-stage renal disease requiring hemodialysis, diabetes, hypertension, and hyperlipidemia who presented with a five-day history of altered mental status. He was performing all of his activities of daily living at baseline. After his dialysis session on Friday he began refusing medications and then refused to attend dialysis on Monday. He has not been eating well. CT of head showed age indeterminate LEFT basal ganglia lacunar infarct, old appearing LEFT thalamus lacunar infarct. Moderate to severe global brain atrophy and moderate to severe chronic small vessel ischemic disease. CXR was unremarkable. Pt found to have  meningoencephalitis of unknown origin.       SLP Plan  Continue with current plan of care       Recommendations                   Plan: Continue with current plan of care       GO                Michael Dorsey, Riley NearingBonnie Dorsey 05/22/2016, 9:10 AM

## 2016-05-23 LAB — MISC LABCORP TEST (SEND OUT)
Labcorp test code: 914280
Labcorp test code: 138479

## 2016-05-23 LAB — VARICELLA-ZOSTER BY PCR: Varicella-Zoster, PCR: NEGATIVE

## 2016-05-23 LAB — CSF CULTURE: CULTURE: NO GROWTH

## 2016-05-23 LAB — GLUCOSE, CAPILLARY
GLUCOSE-CAPILLARY: 175 mg/dL — AB (ref 65–99)
Glucose-Capillary: 119 mg/dL — ABNORMAL HIGH (ref 65–99)
Glucose-Capillary: 341 mg/dL — ABNORMAL HIGH (ref 65–99)
Glucose-Capillary: 186 mg/dL — ABNORMAL HIGH (ref 65–99)

## 2016-05-23 LAB — CSF CULTURE W GRAM STAIN

## 2016-05-23 LAB — MTB NAA WITHOUT AFB CULTURE: M Tuberculosis, Naa: NEGATIVE

## 2016-05-23 LAB — LEPTOSPIRA AB SCREEN

## 2016-05-23 MED ORDER — POLYETHYLENE GLYCOL 3350 17 G PO PACK
17.0000 g | PACK | Freq: Every day | ORAL | Status: DC
  Administered 2016-05-26 – 2016-05-30 (×4): 17 g via ORAL
  Filled 2016-05-23 (×5): qty 1

## 2016-05-23 NOTE — Progress Notes (Signed)
Occupational Therapy Treatment Patient Details Name: Michael Dorsey MRN: [id number] DOB: 06-Apr-1945 Today's Date: 05/23/2016    History of present illness 71 year old man with a history of end-stage renal disease requiring hemodialysis, diabetes, hypertension, and hyperlipidemia who presents with a five-day history of altered mental status. MRI on 2/9 showed no acute intracranial abnormality.    OT comments  Pt performed oral care with moderate assistance in standing and combed his hair with set up seated in chair. Pt with stable VS throughout session. Demonstrates poor attention and sequencing requiring multimodal cues. Perseverative with grooming tasks including continuing to comb that part of his head in which he does not have hair. Continue to recommend SNF for further rehab.  Follow Up Recommendations  SNF;Supervision/Assistance - 24 hour    Equipment Recommendations       Recommendations for Other Services      Precautions / Restrictions Precautions Precautions: Fall Restrictions Weight Bearing Restrictions: No       Mobility Bed Mobility Overal bed mobility: Needs Assistance Bed Mobility: Supine to Sit     Supine to sit: Supervision     General bed mobility comments: increased time, verbal and visual cueing for sequencing, supervision for safety  Transfers Overall transfer level: Needs assistance Equipment used: Rolling walker (2 wheeled) Transfers: Sit to/from Stand Sit to Stand: Min assist;+2 safety/equipment         General transfer comment: increased time, min A to rise from bed, x2 for safety    Balance Overall balance assessment: Needs assistance Sitting-balance support: No upper extremity supported;Feet supported Sitting balance-Leahy Scale: Good     Standing balance support: During functional activity;No upper extremity supported Standing balance-Leahy Scale: Poor Standing balance comment: in standing at sink, pt able to stand without UE  supports; however, heavy posterior lean requiring mod A to maintain upright standing position                   ADL Overall ADL's : Needs assistance/impaired     Grooming: Oral care;Standing;Moderate assistance;Brushing hair;Sitting Grooming Details (indicate cue type and reason): pt with poor standing balance and posterior lean when using B UEs to manipulate toothpaste, perseveration noted requiring verbal and physical cues to redirect         Upper Body Dressing : Sitting;Moderate assistance                   Functional mobility during ADLs: +2 for safety/equipment;Moderate assistance;Rolling walker;Cueing for sequencing;Cueing for safety        Vision                     Perception     Praxis      Cognition   Behavior During Therapy: Flat affect Overall Cognitive Status: Impaired/Different from baseline Area of Impairment: Attention;Memory;Following commands;Safety/judgement;Awareness;Problem solving   Current Attention Level: Focused Memory: Decreased short-term memory  Following Commands: Follows one step commands inconsistently;Follows one step commands with increased time Safety/Judgement: Decreased awareness of safety;Decreased awareness of deficits Awareness: Emergent Problem Solving: Slow processing;Decreased initiation;Difficulty sequencing;Requires verbal cues;Requires tactile cues        Exercises     Shoulder Instructions       General Comments      Pertinent Vitals/ Pain       Pain Assessment: Faces Faces Pain Scale: No hurt  Home Living  Prior Functioning/Environment              Frequency  Min 2X/week        Progress Toward Goals  OT Goals(current goals can now be found in the care plan section)  Progress towards OT goals: Progressing toward goals  Acute Rehab OT Goals Patient Stated Goal: None stated Time For Goal Achievement:  05/24/16 Potential to Achieve Goals: Good  Plan Discharge plan remains appropriate    Co-evaluation                 End of Session Equipment Utilized During Treatment: Gait belt;Rolling walker  OT Visit Diagnosis: Unsteadiness on feet (R26.81);Other abnormalities of gait and mobility (R26.89);Other symptoms and signs involving cognitive function   Activity Tolerance Patient tolerated treatment well   Patient Left in chair;with call bell/phone within reach;with chair alarm set   Nurse Communication          Time: [id number] OT Time Calculation (min): 28 min  Charges: OT General Charges $OT Visit: 1 Procedure OT Treatments $Self Care/Home Management : 8-22 mins   Michael Dorsey, Michael Dorsey Dorsey 05/23/2016, 4:03 PM  [phone]

## 2016-05-23 NOTE — Progress Notes (Signed)
Physical Therapy Treatment Patient Details Name: Michael Dorsey MRN: [id number] DOB: 1945/11/09 Today's Date: 05/23/2016    History of Present Illness 71 year old man with a history of end-stage renal disease requiring hemodialysis, diabetes, hypertension, and hyperlipidemia who presents with a five-day history of altered mental status. MRI on 2/9 showed no acute intracranial abnormality.     PT Comments    Pt presented supine in bed with HOB elevated, awake and willing to participate in therapy session. Pt making good progress with functional mobility. All VSS throughout. Pt would continue to benefit from skilled physical therapy services at this time while admitted and after d/c to address his limitations in order to improve his overall safety and independence with functional mobility.     Follow Up Recommendations  Supervision/Assistance - 24 hour;SNF     Equipment Recommendations  Rolling walker with 5" wheels    Recommendations for Other Services       Precautions / Restrictions Precautions Precautions: Fall Restrictions Weight Bearing Restrictions: No    Mobility  Bed Mobility Overal bed mobility: Needs Assistance Bed Mobility: Supine to Sit     Supine to sit: Supervision     General bed mobility comments: increased time, verbal and visual cueing for sequencing, supervision for safety  Transfers Overall transfer level: Needs assistance Equipment used: Rolling walker (2 wheeled) Transfers: Sit to/from Stand Sit to Stand: Min assist;+2 safety/equipment         General transfer comment: increased time, min A to rise from bed, x2 for safety  Ambulation/Gait Ambulation/Gait assistance: Min assist;+2 safety/equipment Ambulation Distance (Feet): 40 Feet Assistive device: Rolling walker (2 wheeled) Gait Pattern/deviations: Step-through pattern;Decreased step length - right;Decreased step length - left;Decreased stride length;Shuffle Gait velocity:  decreased Gait velocity interpretation: Below normal speed for age/gender General Gait Details: pt moderately unsteady with ambulation, constant min A and verbal/tactile cues for safety with RW, occasional min A for movement of RW   Stairs            Wheelchair Mobility    Modified Rankin (Stroke Patients Only)       Balance Overall balance assessment: Needs assistance Sitting-balance support: No upper extremity supported;Feet supported Sitting balance-Leahy Scale: Good     Standing balance support: During functional activity;No upper extremity supported Standing balance-Leahy Scale: Poor Standing balance comment: in standing at sink, pt able to stand without UE supports; however, heavy posterior lean requiring mod A to maintain upright standing position                    Cognition Arousal/Alertness: Awake/alert Behavior During Therapy: Flat affect Overall Cognitive Status: Impaired/Different from baseline Area of Impairment: Attention;Memory;Following commands;Safety/judgement;Awareness;Problem solving   Current Attention Level: Focused Memory: Decreased short-term memory Following Commands: Follows one step commands inconsistently;Follows one step commands with increased time Safety/Judgement: Decreased awareness of safety;Decreased awareness of deficits Awareness: Emergent Problem Solving: Slow processing;Decreased initiation;Difficulty sequencing;Requires verbal cues;Requires tactile cues      Exercises      General Comments        Pertinent Vitals/Pain Pain Assessment: Faces Faces Pain Scale: No hurt    Home Living                      Prior Function            PT Goals (current goals can now be found in the care plan section) Acute Rehab PT Goals Patient Stated Goal: None stated PT Goal Formulation: Patient unable to participate  in goal setting Time For Goal Achievement: 06/06/16 Potential to Achieve Goals: Good Progress  towards PT goals: Progressing toward goals    Frequency    Min 3X/week      PT Plan Current plan remains appropriate    Co-evaluation             End of Session Equipment Utilized During Treatment: Gait belt Activity Tolerance: Patient tolerated treatment well Patient left: in chair;with call bell/phone within reach;with chair alarm set Nurse Communication: Mobility status PT Visit Diagnosis: Unsteadiness on feet (R26.81);Other abnormalities of gait and mobility (R26.89)     Time: [id number] PT Time Calculation (min) (ACUTE ONLY): 28 min  Charges:  $Gait Training: 8-22 mins                    G CodesAlessandra Bevels:       Kenith Trickel M Tzvi Economou 05/23/2016, 3:40 PM Deborah ChalkJennifer Shalla Bulluck, PT, DPT [phone]

## 2016-05-23 NOTE — Plan of Care (Signed)
Problem: Education: Goal: Knowledge of disease or condition will improve Outcome: Progressing Patient is more alert and able to participate more in education, but is still confused and requires further instruction.

## 2016-05-23 NOTE — Plan of Care (Signed)
Problem: Self-Care: Goal: Ability to participate in self-care as condition permits will improve Outcome: Progressing Patient able to feed self and brush teeth without difficulty.

## 2016-05-23 NOTE — Progress Notes (Signed)
Subjective:  Patient much improved this morning. This is the most interactive he has been in his prolonged hospital course. He was up sitting in a chair and following all commands. He was alert and oriented to self, place and age. He was interactive with the team and for the first time asked several questions. He says he is in no pain. He was asking when he may be able to go home.  Objective:  Vital signs in last 24 hours: Vitals:   05/23/16 0423 05/23/16 0500 05/23/16 0609 05/23/16 0817  BP: 124/81 (!) 151/92 (!) 143/89 124/71  Pulse: 70   81  Resp: 11 14  17   Temp: 98.3 F (36.8 C)   97.8 F (36.6 C)  TempSrc: Oral   Oral  SpO2: 98%   100%  Weight:  150 lb 5.7 oz (68.2 kg)    Height:       Physical Exam  Constitutional:  Sitting up resting comfortably  Cardiovascular: Normal rate and regular rhythm.   Murmur heard. Holosystolic murmur heard best at the right upper sternal border  Respiratory: Effort normal and breath sounds normal. No respiratory distress. He has no wheezes.  GI: Soft. Bowel sounds are normal. He exhibits no distension.  Musculoskeletal: He exhibits no edema.  Neurological:  Patient alert. Oriented to person, place. Much more interactive than prior.     Assessment/Plan:  Principal Problem:   Meningoencephalitis Active Problems:   Type 2 diabetes mellitus (HCC)   Hypertension, renal disease   ESRD on hemodialysis (HCC)   Labile blood glucose   Anemia of chronic disease   Hyperglycemia   Stroke (cerebrum) (HCC)   History of diabetes mellitus   Meningitis   Elevated serum creatinine   Encephalopathy   Hypothermia   Hypoglycemia   MRSA bacteremia  Mr.Rolly Anane-Brobbeyis a 71 y.o.malewith HTN, DM, ESRD on HD, who presents w/ 5 day h/o altered mental status of unclear etiology.  # Meningoencephalitis  Patient's CSF profile as following: 208 white blood cells with a lymphocytic predominance, decreased glucose at 36 and increased protein at  241. Repeat lumbar puncture shows the following: 268 white blood cells with a lymphocytic predominance, elevated protein at 193 and elevated glucose at 83. The etiology of the patient's altered mental status remains vague. He is much improved from prior and has made daily improvements over the last 48 hours. My suspicion remains high for unidentified viral etiology causing meningoencephalitis. For now we will continue with supportive management and have physical therapy work more aggressively with the patient. We will continue to follow up results of CSF evaluation that are still pending. - VDRL from CSF the first lumbar puncture negative - CSF cytology and flow cytometry, we'll await results from pathology - HSV 1 and 2, CMV, JC virus negative - Appreciate infectious disease recommendations    # MRSA Bacteremia: 1 of 2 blood cultures drawn 05/16/16 positive. Blood cultures at presentation were negative. Repeat blood cultures have been obtained on 05/19/2016. No evidence of infective endocarditis on physical examination or neuro imaging thus far. Ultrasound of his fistula does not show fluid collection or evidence for infection.Will need a minimum of 2 weeks of vancomycin for MRSA bacteremia. Transthoracic echocardiogram shows left ventricular ejection fraction of 60-65% with moderate aortic stenosis. Aortic stenosis fits clinically with the murmur that was appreciated on auscultation.  -- Vancomycin -- Follow-up blood cultures -- echocardiogram  # IDDM Patient has labile and difficult to control glucose. - A1c 12.3 (05/08/16) -  SSI-S - Levemir 6 units qhs  # Hypertension Currently normotensive. Continue with antihypertensive medications as detailed below. - HD per Nephrology - Hydralazine 100 mg po TID - Labetalol 300 mg twice a day - Hydralazine PRN BP > 185/110. - Consider adding alpha-blocker   # ESRD on MWF HD: - Continue HD recommendations per nephrology  DVT/PE prophylaxis:  Heparin FEN/GI:Carbmodified  Dispo: Anticipated discharge pending clinical course.   Thomasene LotJames Shontell Prosser, MD 05/23/2016, 9:03 AM

## 2016-05-23 NOTE — Progress Notes (Signed)
  [facility] Progress Note   Subjective: groggy, responds minimally, moans and grunts a lot  Vitals:   05/23/16 0500 05/23/16 0609 05/23/16 0817 05/23/16 0914  BP: (!) 151/92 (!) 143/89 124/71 133/79  Pulse:   81 85  Resp: 14  17   Temp:   97.8 F (36.6 C)   TempSrc:   Oral   SpO2:   100%   Weight: 68.2 kg (150 lb 5.7 oz)     Height:        Inpatient medications: . aspirin  81 mg Oral Daily  . darbepoetin (ARANESP) injection - DIALYSIS  60 mcg Intravenous Q Wed-HD  . feeding supplement (ENSURE ENLIVE)  237 mL Oral BID BM  . feeding supplement (PRO-STAT SUGAR FREE 64)  30 mL Oral BID  . ferric citrate  210 mg Oral TID WC  . heparin  5,000 Units Subcutaneous Q8H  . hydrALAZINE  100 mg Oral Q8H  . insulin aspart  0-9 Units Subcutaneous TID WC  . insulin detemir  6 Units Subcutaneous QHS  . labetalol  300 mg Oral BID  . multivitamin  1 tablet Oral QHS  . vancomycin  750 mg Intravenous Q M,W,F-HD    acetaminophen **OR** acetaminophen (TYLENOL) oral liquid 160 mg/5 mL **OR** acetaminophen, hydrALAZINE  Exam: Pt is groggy, responds minimally, moans and grunts a lot, not opening eyes No jvd Chest clear bilat RRR  Abd soft ntnd no mass or hsm No LE edema LUA AVF+bruit Neuro as above  Dialysis: MWF AF  4h  2/2 bath  P2  LUA AVF   76kg   Hep 7100 Venofer 50/wk Mircera 50 q 2, last 2.7 Hect 6 ug      Assessment: 1. Encephalitis - unclear cause, per primary 2. ESRD HD mwf 3. HTN bp's good 4. Vol has lost wt w illness, down 8kg 5. HPTH follow 6. Anemia esa 7. DM stable  Plan - HD tomorrow, UF 1-2kg   Vinson Moselleob Lou Loewe MD [facility] pager [phone]   05/23/2016, 11:44 AM    Recent Labs Lab 05/20/16 0350 05/21/16 1758 05/22/16 0958  NA 136 135 136  K 4.6 4.1 3.7  CL 96* 94* 96*  CO2 28 27 26   GLUCOSE 189* 220* 175*  BUN 49* 28* 34*  CREATININE 7.21* 5.60* 6.48*  CALCIUM 10.8* 10.2 10.3  PHOS 4.0 4.0 4.1    Recent  Labs Lab 05/20/16 0350 05/21/16 1758 05/22/16 0958  ALBUMIN 3.0* 3.1* 3.0*    Recent Labs Lab 05/18/16 0232 05/20/16 0350 05/21/16 1758 05/22/16 1000  WBC 7.7 9.0 9.2 7.7  NEUTROABS 5.9  --   --   --   HGB 9.6* 10.4* 10.8* 10.7*  HCT 29.7* 31.7* 33.9* 32.8*  MCV 91.1 91.6 93.6 92.7  PLT 169 200 195 176   Iron/TIBC/Ferritin/ %Sat No results found for: IRON, TIBC, FERRITIN, IRONPCTSAT

## 2016-05-24 LAB — CBC
HCT: 31.8 % — ABNORMAL LOW (ref 39.0–52.0)
Hemoglobin: 10.2 g/dL — ABNORMAL LOW (ref 13.0–17.0)
MCH: 29.9 pg (ref 26.0–34.0)
MCHC: 32.1 g/dL (ref 30.0–36.0)
MCV: 93.3 fL (ref 78.0–100.0)
Platelets: 158 10*3/uL (ref 150–400)
RBC: 3.41 MIL/uL — ABNORMAL LOW (ref 4.22–5.81)
RDW: 17.2 % — ABNORMAL HIGH (ref 11.5–15.5)
WBC: 5.2 10*3/uL (ref 4.0–10.5)

## 2016-05-24 LAB — CULTURE, BLOOD (ROUTINE X 2)
CULTURE: NO GROWTH
Culture: NO GROWTH

## 2016-05-24 LAB — COMPREHENSIVE METABOLIC PANEL
ALBUMIN: 3 g/dL — AB (ref 3.5–5.0)
ALT: 21 U/L (ref 17–63)
AST: 17 U/L (ref 15–41)
Alkaline Phosphatase: 65 U/L (ref 38–126)
Anion gap: 13 (ref 5–15)
BUN: 53 mg/dL — AB (ref 6–20)
CO2: 25 mmol/L (ref 22–32)
CREATININE: 7.4 mg/dL — AB (ref 0.61–1.24)
Calcium: 10 mg/dL (ref 8.9–10.3)
Chloride: 96 mmol/L — ABNORMAL LOW (ref 101–111)
GFR calc Af Amer: 8 mL/min — ABNORMAL LOW (ref >60)
GFR calc non Af Amer: 7 mL/min — ABNORMAL LOW (ref >60)
Glucose, Bld: 169 mg/dL — ABNORMAL HIGH (ref 65–99)
POTASSIUM: 4.2 mmol/L (ref 3.5–5.1)
SODIUM: 134 mmol/L — AB (ref 135–145)
Total Bilirubin: 0.4 mg/dL (ref 0.3–1.2)
Total Protein: 6.6 g/dL (ref 6.5–8.1)

## 2016-05-24 LAB — GLUCOSE, CAPILLARY
Glucose-Capillary: 169 mg/dL — ABNORMAL HIGH (ref 65–99)
Glucose-Capillary: 114 mg/dL — ABNORMAL HIGH (ref 65–99)
Glucose-Capillary: 239 mg/dL — ABNORMAL HIGH (ref 65–99)
Glucose-Capillary: 251 mg/dL — ABNORMAL HIGH (ref 65–99)

## 2016-05-24 MED ORDER — VANCOMYCIN HCL IN DEXTROSE 750-5 MG/150ML-% IV SOLN
INTRAVENOUS | Status: AC
  Administered 2016-05-24: 750 mg via INTRAVENOUS
  Filled 2016-05-24: qty 150

## 2016-05-24 MED ORDER — HEPARIN SODIUM (PORCINE) 1000 UNIT/ML DIALYSIS: 1000.0000 [IU] | INTRAMUSCULAR | Status: DC | PRN

## 2016-05-24 MED ORDER — SODIUM CHLORIDE 0.9 % IV SOLN: 100.0000 mL | INTRAVENOUS | Status: DC | PRN

## 2016-05-24 MED ORDER — LIDOCAINE HCL (PF) 1 % IJ SOLN: 5.0000 mL | INTRAMUSCULAR | Status: DC | PRN

## 2016-05-24 MED ORDER — LIDOCAINE-PRILOCAINE 2.5-2.5 % EX CREA: 1.0000 "application " | TOPICAL_CREAM | CUTANEOUS | Status: DC | PRN

## 2016-05-24 MED ORDER — PENTAFLUOROPROP-TETRAFLUOROETH EX AERO: 1.0000 "application " | INHALATION_SPRAY | CUTANEOUS | Status: DC | PRN

## 2016-05-24 MED ORDER — HEPARIN SODIUM (PORCINE) 1000 UNIT/ML DIALYSIS: 3500.0000 [IU] | INTRAMUSCULAR | Status: DC | PRN

## 2016-05-24 NOTE — Plan of Care (Signed)
Problem: Health Behavior/Discharge Planning: Goal: Ability to manage health-related needs will improve Outcome: Progressing Patient able to eat a normal diet and pivot from bed to BSC/chair.   Problem: Self-Care: Goal: Ability to communicate needs accurately will improve Outcome: Progressing Patient able to carry on conversation at this time.   Problem: Nutrition: Goal: Dietary intake will improve Outcome: Progressing Patient not finishing meal trays; however, he is drinking his prostat supplements.

## 2016-05-24 NOTE — Progress Notes (Signed)
[facility]    Date of Admission:  05/08/2016   Total days of antibiotics 8                Day 5 vanco           ID: Michael Dorsey is a 71 y.o. male with ESRD, DM, admitted on 2/7 with encephalopathy, LP found to have lymphocytic predominance pleocytosis with CSF WBC 268, 96%L, high protein, low glucose, culture negative from CSF. Also found to have 1/4 bottles of blood cx for MRSA Principal Problem:   Meningoencephalitis Active Problems:   Type 2 diabetes mellitus (HCC)   Hypertension, renal disease   ESRD on hemodialysis (HCC)   Labile blood glucose   Anemia of chronic disease   Hyperglycemia   Stroke (cerebrum) (HCC)   History of diabetes mellitus   Meningitis   Elevated serum creatinine   Encephalopathy   Hypothermia   Hypoglycemia   MRSA bacteremia    Subjective: Continues to improve mentation. He is aware of person and place  ROS:  Denies headache or fever Medications:  . aspirin  81 mg Oral Daily  . darbepoetin (ARANESP) injection - DIALYSIS  60 mcg Intravenous Q Wed-HD  . feeding supplement (ENSURE ENLIVE)  237 mL Oral BID BM  . feeding supplement (PRO-STAT SUGAR FREE 64)  30 mL Oral BID  . ferric citrate  210 mg Oral TID WC  . heparin  5,000 Units Subcutaneous Q8H  . hydrALAZINE  100 mg Oral Q8H  . insulin aspart  0-9 Units Subcutaneous TID WC  . insulin detemir  6 Units Subcutaneous QHS  . labetalol  300 mg Oral BID  . multivitamin  1 tablet Oral QHS  . polyethylene glycol  17 g Oral Daily  . vancomycin  750 mg Intravenous Q M,W,F-HD    Objective: Vital signs in last 24 hours: Temp:  [97.3 F (36.3 C)-98.1 F (36.7 C)] 97.6 F (36.4 C) (02/23 1332) Pulse Rate:  [69-85] 78 (02/23 1332) Resp:  [13-19] 17 (02/23 1332) BP: (91-175)/(54-94) 125/87 (02/23 1332) SpO2:  [97 %-100 %] 99 % (02/23 1332) Weight:  [149 lb 7.6 oz (67.8 kg)-152 lb 12.5 oz (69.3 kg)] 149 lb 7.6 oz (67.8 kg) (02/23 1247) gen = laying in bed, eyes  open tracking HEENT: no scleral icterus pulm = CTAB no W/C/R Cors= nl s1,s2, SEM heard throughout abd = Soft, NTND Ext: trace edema Neuro - moves all extremities, answers 1/2 of questions correctly  Lab Results  Recent Labs  05/22/16 0958 05/22/16 1000 05/24/16 0730  WBC  --  7.7 5.2  HGB  --  10.7* 10.2*  HCT  --  32.8* 31.8*  NA 136  --  134*  K 3.7  --  4.2  CL 96*  --  96*  CO2 26  --  25  BUN 34*  --  53*  CREATININE 6.48*  --  7.40*   Liver Panel  Recent Labs  05/22/16 0958 05/24/16 0730  PROT  --  6.6  ALBUMIN 3.0* 3.0*  AST  --  17  ALT  --  21  ALKPHOS  --  65  BILITOT  --  0.4    Microbiology: 2/18 blood cx ngtd 2/15 blood cx 1 in 4 bottles MRSA ( vanco nad cefepime started)  2/13 blood cx 2/2 sets NGTD  2/13 CSF cx NGTD (acyclovir started) 2/7 blood cx NGTD 2/12 RPR 1:1 2/13 VDRL CSF negative 2/13 HSV  1/2 negative 2/13 CMV negative 2/13 JC negative HIV negative CrAg negative Studies/Results: No results found.   Assessment/Plan: 71yo M with ESRD on HD, HTN, DM2, dementia admitted on encephalopathy since 2/7. Repeat studies have not found infectious source, and found to have MRSA in 1 of 4 bottles on HD #7.   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 Enterovirus panel and HSV negative as is VDRL, crypto. mTB pending though he is improving so unlikely. Cytology was negative as well, looking at malignancy related causes.  He is much improved today. Recommend to see if this improvement progresses tomorrow. Etiology of original encephalitis still unclear. Numerous studies pending.  MRSA bacteremia = only found in 1 of 4 bottles but truly 1 in 8 bottles since he had prior blood cx 2 days prior to the positive cultures. I think this is incidental. Not likely to be cause of presentation.treat for 2 wk with vancomycin with HD. Currently day 8 of 14 of treatment   Michael Dorsey, [facility] Cell: [phone] Pager: [phone]  05/24/2016, 2:23 PM

## 2016-05-24 NOTE — Progress Notes (Signed)
SLP Cancellation Note  Patient Details Name: Caesar BookmanJohn Anane-Brobbey MRN: [id number] DOB: [birth year]   Cancelled treatment:       Reason Eval/Treat Not Completed: Patient at procedure or test/unavailable   Lesley Atkin, Riley NearingBonnie Caroline 05/24/2016, 8:38 AM

## 2016-05-24 NOTE — Progress Notes (Signed)
[facility] Progress Note   Dialysis Order MWF AF  4h 2/2 bath P2 LUA AVF 76kg Hep 7100 Venofer 50/wk Mircera 50 q 2, last 2.7 Hect 6 ugs:   Assessment/Plan: 1. Encephalitis - empiric abtx - per primary cause unclear,  ID following 2. ESRD - MWF - K 4.2  3. Anemia - hgb 10.2 Aranesp 60/wk 4. Secondary hyperparathyroidism -  Ca 10-hectorol on hold P ok 5. HTN/volume - controlled - lowered goal from 2.5 to 2 L - lowered temp to 36 6. Nutrition - alb 3 7. DM- BS ok  Sheffield Slider, PA-C [facility] Beeper [phone] 05/24/2016,9:54 AM  LOS: 16 days   Pt seen, examined and agree w A/P as above.  Vinson Moselle MD [facility] pager [phone]   05/24/2016, 11:14 AM    Subjective:   "Michael Dorsey" "fine" plus other intelligible answers  Objective Vitals:   05/24/16 0900 05/24/16 0915 05/24/16 0930 05/24/16 0945  BP: 136/77 125/68 109/67 102/63  Pulse: 76 78 80 83  Resp:      Temp:      TempSrc:      SpO2:      Weight:      Height:       Physical Exam General: NAD supine on HD Heart: RRR Lungs: no rales Abdomen: soft NT Extremities: no LE edema Dialysis Access: left upper AVF Neuro: "[facility] Gainer", didn't know year or president    Additional Objective Labs: Basic Metabolic Panel:  Recent Labs Lab 05/20/16 0350 05/21/16 1758 05/22/16 0958 05/24/16 0730  NA 136 135 136 134*  K 4.6 4.1 3.7 4.2  CL 96* 94* 96* 96*  CO2 28 27 26 25   GLUCOSE 189* 220* 175* 169*  BUN 49* 28* 34* 53*  CREATININE 7.21* 5.60* 6.48* 7.40*  CALCIUM 10.8* 10.2 10.3 10.0  PHOS 4.0 4.0 4.1  --    Liver Function Tests:  Recent Labs Lab 05/21/16 1758 05/22/16 0958 05/24/16 0730  AST  --   --  17  ALT  --   --  21  ALKPHOS  --   --  65  BILITOT  --   --  0.4  PROT  --   --  6.6  ALBUMIN 3.1* 3.0* 3.0*   CBC:  Recent Labs Lab 05/18/16 0232 05/20/16 0350 05/21/16 1758 05/22/16 1000 05/24/16 0730  WBC 7.7 9.0 9.2 7.7 5.2   NEUTROABS 5.9  --   --   --   --   HGB 9.6* 10.4* 10.8* 10.7* 10.2*  HCT 29.7* 31.7* 33.9* 32.8* 31.8*  MCV 91.1 91.6 93.6 92.7 93.3  PLT 169 200 195 176 158   Blood Culture    Component Value Date/Time   SDES CSF 05/20/2016 1146   SPECREQUEST NONE 05/20/2016 1146   CULT NO GROWTH 3 DAYS 05/20/2016 1146   REPTSTATUS 05/23/2016 FINAL 05/20/2016 1146    CBG:  Recent Labs Lab 05/23/16 0816 05/23/16 1216 05/23/16 1703 05/23/16 2122 05/24/16 0756  GLUCAP 119* 341* 175* 186* 169*    Lab Results  Component Value Date   INR 1.19 05/08/2016   INR 1.14 03/16/2014   INR 1.07 11/07/2013  Medications:  . aspirin  81 mg Oral Daily  . darbepoetin (ARANESP) injection - DIALYSIS  60 mcg Intravenous Q Wed-HD  . feeding supplement (ENSURE ENLIVE)  237 mL Oral BID BM  . feeding supplement (PRO-STAT SUGAR FREE 64)  30 mL Oral BID  . ferric citrate  210 mg  Oral TID WC  . heparin  5,000 Units Subcutaneous Q8H  . hydrALAZINE  100 mg Oral Q8H  . insulin aspart  0-9 Units Subcutaneous TID WC  . insulin detemir  6 Units Subcutaneous QHS  . labetalol  300 mg Oral BID  . multivitamin  1 tablet Oral QHS  . polyethylene glycol  17 g Oral Daily  . vancomycin  750 mg Intravenous Q M,W,F-HD

## 2016-05-24 NOTE — Progress Notes (Signed)
Subjective: No acute events overnight. Patient continues to improve remarkably from a mental status standpoint. When evaluated this morning he was sitting up resting comfortably. He was alert and oriented to self, place, month. He also knew his country of origin and his former jobs. He still seems to struggle with finer details but overall is greatly improved. He was also asking for something to eat this morning. I am very encouraged with this improvement in his symptoms. He says he was not in any pain. He had no additional questions.   Objective:  Vital signs in last 24 hours: Vitals:   05/24/16 0410 05/24/16 0500 05/24/16 0524 05/24/16 0600  BP:  (!) 172/86 (!) 172/86 (!) 166/85  Pulse:  73  70  Resp:  17  15  Temp:      TempSrc:      SpO2:  97%  98%  Weight: 152 lb 12.5 oz (69.3 kg)     Height:       Physical Exam  Constitutional:  Sitting up resting comfortably  Cardiovascular: Normal rate and regular rhythm.   Murmur heard. Holosystolic murmur heard best at the right upper sternal border  Respiratory: Effort normal and breath sounds normal. No respiratory distress. He has no wheezes.  GI: Soft. Bowel sounds are normal. He exhibits no distension.  Musculoskeletal: He exhibits no edema.  Neurological:  Patient alert. Oriented to person, place and month. Mental status continues to improve.     Assessment/Plan:  Principal Problem:   Meningoencephalitis Active Problems:   Type 2 diabetes mellitus (HCC)   Hypertension, renal disease   ESRD on hemodialysis (HCC)   Labile blood glucose   Anemia of chronic disease   Hyperglycemia   Stroke (cerebrum) (HCC)   History of diabetes mellitus   Meningitis   Elevated serum creatinine   Encephalopathy   Hypothermia   Hypoglycemia   MRSA bacteremia  MichaelJaxsin Anane-Brobbeyis a 71 y.o.malewith HTN, DM, ESRD on HD, who presents w/ 5 day h/o altered mental status of unclear etiology.  # Meningoencephalitis  Patient's CSF  profile as following: 208 white blood cells with a lymphocytic predominance, decreased glucose at 36 and increased protein at 241. Repeat lumbar puncture shows the following: 268 white blood cells with a lymphocytic predominance, elevated protein at 193 and elevated glucose at 83. The etiology of the patient's altered mental status remains vague. However, I think the most likely etiology was secondary to viral infection that we are unable to identify with CSF studies. He continues to improve from a mental status standpoint. For today I think the patient is appropriate to transfer out of stepdown unit and to a normal floor. PT has recommended skilled nursing facility placement. If  he continues to improve over the next 48-72 hours we may be ready to plan for discharge to skilled nursing facility. - VDRL from CSF the first lumbar puncture negative - CSF cytology and flow cytometry, we'll await results from pathology - HSV 1 and 2, CMV, JC virus negative - Appreciate infectious disease recommendations - PT as recommended skilled nursing facility - Transferred to normal floor    # MRSA Bacteremia: 1 of 2 blood cultures drawn 05/16/16 positive. Blood cultures at presentation were negative. Repeat blood cultures have been obtained on 05/19/2016. No evidence of infective endocarditis on physical examination or neuro imaging thus far. Ultrasound of his fistula does not show fluid collection or evidence for infection.Will need a minimum of 2 weeks of vancomycin for MRSA bacteremia. Transthoracic  echocardiogram shows left ventricular ejection fraction of 60-65% with moderate aortic stenosis. Aortic stenosis fits clinically with the murmur that was appreciated on auscultation.  -- Vancomycin, will need 2 weeks. Started 05/17/2016. Currently day 7 -- Follow-up blood cultures -- echocardiogram  # IDDM Patient has labile and difficult to control glucose. - A1c 12.3 (05/08/16) - SSI-S - Levemir 6 units qhs  #  Hypertension  Continue with antihypertensive medications as detailed below. - HD per Nephrology - Hydralazine 100 mg po TID - Labetalol 300 mg twice a day - Hydralazine PRN BP > 185/110. - Consider adding alpha-blocker   # ESRD on MWF HD: - Continue HD recommendations per nephrology  DVT/PE prophylaxis: Heparin FEN/GI:Carbmodified  Dispo: Anticipated discharge pending clinical course.   Thomasene LotJames Cardale Dorer, MD 05/24/2016, 7:20 AM

## 2016-05-24 NOTE — Progress Notes (Signed)
Patient back from dialysis approximately 1330 slightly agitated and intermittently became more agitated at times refusing pm meds.  Patient pulled out saline lock to right forearm without injury.  Patient given Activity blanket, washcloths to fold to help with agitation.

## 2016-05-24 NOTE — Progress Notes (Signed)
OT Cancellation Note  Patient Details Name: Michael BookmanJohn Dorsey MRN: [id number] DOB: March 19, 1946   Cancelled Treatment:    Reason Eval/Treat Not Completed: Patient at procedure or test/ unavailable (Pt in HD. Will follow.)  Evern BioMayberry, Aleesha Ringstad Lynn 05/24/2016, 9:02 AM  [phone]

## 2016-05-24 NOTE — Care Management Note (Signed)
Case Management Note  Patient Details  Name: Michael BookmanJohn Anane-Brobbey MRN: [id number] Date of Birth: 01/14/1946  Subjective/Objective:  Pt lives with wife and daughter.    Therapies initially recommended SNF for rehab but pt has no insurance and mental and functional status are improving and wife will be able to provide assistance when daughter is at work..                      Expected Discharge Plan:  Home w Home Health Services  Discharge planning Services  CM Consult  Status of Service:  In process, will continue to follow  Magdalene RiverMayo, Nalah Macioce T, RN 05/24/2016, 12:54 PM

## 2016-05-25 LAB — BASIC METABOLIC PANEL
Anion gap: 12 (ref 5–15)
BUN: 35 mg/dL — ABNORMAL HIGH (ref 6–20)
CHLORIDE: 90 mmol/L — AB (ref 101–111)
CO2: 31 mmol/L (ref 22–32)
CREATININE: 4.98 mg/dL — AB (ref 0.61–1.24)
Calcium: 10 mg/dL (ref 8.9–10.3)
GFR, EST AFRICAN AMERICAN: 12 mL/min — AB (ref >60)
GFR, EST NON AFRICAN AMERICAN: 10 mL/min — AB (ref >60)
Glucose, Bld: 184 mg/dL — ABNORMAL HIGH (ref 65–99)
Potassium: 5 mmol/L (ref 3.5–5.1)
SODIUM: 133 mmol/L — AB (ref 135–145)

## 2016-05-25 LAB — CBC
HCT: 34.4 % — ABNORMAL LOW (ref 39.0–52.0)
HEMOGLOBIN: 11.2 g/dL — AB (ref 13.0–17.0)
MCH: 30.8 pg (ref 26.0–34.0)
MCHC: 32.6 g/dL (ref 30.0–36.0)
MCV: 94.5 fL (ref 78.0–100.0)
PLATELETS: 185 10*3/uL (ref 150–400)
RBC: 3.64 MIL/uL — ABNORMAL LOW (ref 4.22–5.81)
RDW: 17.3 % — ABNORMAL HIGH (ref 11.5–15.5)
WBC: 5.9 10*3/uL (ref 4.0–10.5)

## 2016-05-25 LAB — URINALYSIS, ROUTINE W REFLEX MICROSCOPIC
BILIRUBIN URINE: NEGATIVE
Bacteria, UA: NONE SEEN
Glucose, UA: >=500 mg/dL — AB
Hgb urine dipstick: NEGATIVE
Ketones, ur: NEGATIVE mg/dL
Leukocytes, UA: NEGATIVE
NITRITE: NEGATIVE
PH: 7 (ref 5.0–8.0)
Protein, ur: 100 mg/dL — AB
Specific Gravity, Urine: 1.01 (ref 1.005–1.030)
Squamous Epithelial / LPF: NONE SEEN

## 2016-05-25 LAB — GLUCOSE, CAPILLARY
GLUCOSE-CAPILLARY: 152 mg/dL — AB (ref 65–99)
GLUCOSE-CAPILLARY: 239 mg/dL — AB (ref 65–99)
Glucose-Capillary: 169 mg/dL — ABNORMAL HIGH (ref 65–99)

## 2016-05-25 NOTE — Progress Notes (Addendum)
Call to Nicole CellaDorothy @ [phone] and informed her change in condtion of her father that he is now more confuse with a posey belt on because of confusion and agiagated.  Nicole CellaDorothy stated that sister and mom would be here soon.

## 2016-05-25 NOTE — Progress Notes (Signed)
Pt admitted to the unit as transfer from 4E. Pt alert and verbally responsive; pt and family oriented to the unit and room; fall safety precaution and prevention education completed. Call light within reach; sitter at bedside; posey belt on, family at bedside. Reported off to oncoming RN. Dionne BucyP. Amo Marqui Formby RN

## 2016-05-25 NOTE — Plan of Care (Signed)
Problem: Education: Goal: Knowledge of disease or condition will improve Outcome: Progressing Continued attempts to educate patient on disease process and discharge plan. He is often confused and does not understand why he is in the hospital anymore. He thinks we are holding him hostage. We continue redirecting the patient and trying to keep him in bed.   Problem: Health Behavior/Discharge Planning: Goal: Ability to manage health-related needs will improve Outcome: Progressing Continuing to assess patient for readiness for discharge. Patient demonstrates difficult urinating; bladder scan reveals the patient has retained as much as he put out ( 75ml). D/W MD and decision to test patient's urine for infection before treating retention. Awaiting urine to send to lab.   Problem: Nutrition: Goal: Dietary intake will improve Outcome: Progressing Patient hungry and requesting food. Given a snack on night shift.

## 2016-05-25 NOTE — Progress Notes (Signed)
[place] called to [place] RN 5 M Room 20 .  No further questions asked.

## 2016-05-25 NOTE — Progress Notes (Signed)
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   Medications: . aspirin  81 mg Oral Daily  . darbepoetin (ARANESP) injection - DIALYSIS  60 mcg Intravenous Q Wed-HD  . feeding supplement (ENSURE ENLIVE)  237 mL Oral BID BM  . feeding supplement (PRO-STAT SUGAR FREE 64)  30 mL Oral BID  . ferric citrate  210 mg Oral TID WC  . heparin  5,000 Units Subcutaneous Q8H  . hydrALAZINE  100 mg Oral Q8H  . insulin aspart  0-9 Units Subcutaneous TID WC  . insulin detemir  6 Units Subcutaneous QHS  . labetalol  300 mg Oral BID  . multivitamin  1 tablet Oral QHS  . polyethylene glycol  17 g Oral Daily  . 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 Retired in 2007.  Came to [place] in 2015 to visit family w/ his wife, was found to have renal disease and ended up on dialysis about a year later. Has been on dialysis since, otherwise , per daughter, him and his wife would have already been back home in [place]. [place], the dtrs work and wife looks after him in the daytime.    Dialysis Order MWF AF  4h 2/2 bath P2 LUA AVF 76kg Hep 7100 Venofer  50/wk Mircera 50 q 2, last 2.7 Hect 6 ug  Assessment: 1. Encephalitis - ^^CSF wbc count with lymphcytosis. Also 1/2 +staph in blood cx's, for this is getting empiric abtx planned for 2 wks - cause unclear, prob viral infection,  ID following. MS slowly has improved.  2. ESRD - MWF - K 4.2  3. Anemia - hgb 10.2 Aranesp 60/wk 4. Secondary hyperparathyroidism -  Ca 10-hectorol on hold P ok 5. HTN-  BP's good on labet/ hydral 6. Volume - 7kg under old dry wt, no vol exces on exam 6. Nutrition - alb 3 7. DM- BS ok  Plan - continue MWF HD, BP meds   Vinson Moselleob Cleve Paolillo MD [facility] pager [phone]   05/25/2016, 11:20 AM   Additional Objective Labs: Basic Metabolic Panel:  Recent Labs Lab 05/20/16 0350 05/21/16 1758 05/22/16 0958 05/24/16 0730 05/25/16 0251  NA 136 135 136 134* 133*  K 4.6 4.1 3.7 4.2 5.0  CL 96* 94* 96* 96* 90*  CO2 28 27 26 25 31   GLUCOSE 189* 220* 175* 169* 184*  BUN 49* 28* 34* 53* 35*  CREATININE 7.21* 5.60* 6.48* 7.40* 4.98*  CALCIUM 10.8* 10.2 10.3  10.0 10.0  PHOS 4.0 4.0 4.1  --   --    Liver Function Tests:  Recent Labs Lab 05/21/16 1758 05/22/16 0958 05/24/16 0730  AST  --   --  17  ALT  --   --  21  ALKPHOS  --   --  65  BILITOT  --   --  0.4  PROT  --   --  6.6  ALBUMIN 3.1* 3.0* 3.0*   CBC:  Recent Labs Lab 05/20/16 0350 05/21/16 1758 05/22/16 1000 05/24/16 0730 05/25/16 0251  WBC 9.0 9.2 7.7 5.2 5.9  HGB 10.4* 10.8* 10.7* 10.2* 11.2*  HCT 31.7* 33.9* 32.8* 31.8* 34.4*  MCV 91.6 93.6 92.7 93.3 94.5  PLT 200 195 176 158 185   Blood Culture    Component Value Date/Time   SDES CSF 05/20/2016 1146   SPECREQUEST NONE 05/20/2016 1146   CULT NO GROWTH 3 DAYS 05/20/2016 1146   REPTSTATUS 05/23/2016 FINAL 05/20/2016 1146    CBG:  Recent Labs Lab 05/23/16 2122 05/24/16 0756 05/24/16 1331 05/24/16 1712 05/24/16 2244  GLUCAP 186* 169* 114* 239* 251*    Lab Results  Component Value Date   INR 1.19  05/08/2016   INR 1.14 03/16/2014   INR 1.07 11/07/2013

## 2016-05-25 NOTE — Progress Notes (Signed)
S: Pt seen at the request of RN for increasing agitation and fall risk as pt was noted to repeatedly attempt to get OOB w/o assistance. MS has been improved over the last 1-2 days, however, this evening, pt was reportedly less oriented and becoming agitated. RN states that pt has been c/o frequent urge to urinate w/ small volume of urine produced w/ each void and hesitancy during voiding. These episodes are increasing pt's agitation.  O: I saw the pt and noted him to be lying in bed with soft waist restraint. Lights were on full bring and TV play softly. When I entered the room, pt acknowledged me and asked to know "why are you detaining me?". Pt was oriented to self only, but other wise disoriented. He repeatedly requested to go home, but was redirected with reapeated attempts. He was not combative.  A/P: Given the significant risk of falls, and RN reports that pt will not be redirected when he attempts to get OOB, soft waist restraint was ordered. No pharmacologic sedatives ordered. Request RN to attempt redirection for further agitation, limit inturuptions. Given concern of frequent urination and heistancy, asked RN to attempt clean catch urine for UA to r/o infection.

## 2016-05-25 NOTE — Progress Notes (Signed)
Patient increasingly confused and agitated. Paged MD regarding frequency/urgency and order given to get clean catch urine. Patient trying to get out of bed repeatedly and when trying to reason patient is not able to be reasoned with. Getting increasingly agitated and angry, swinging at staff. Stating we are holding him prisoner and he needs to leave. Explained to him that he would be placed in restraint if he did not comply. Paged MD for posey belt order. MD's came to see patient and assess him. Stated we will keep posey on until morning.   Michael GensStefanie A Serena Petterson, RN

## 2016-05-25 NOTE — Progress Notes (Signed)
Pharmacy Antibiotic Note  Michael Dorsey is a 71 y.o. male admitted on 05/08/2016 with MRSA bacteremia (only found in 1 of 8 bottles). Pharmacy has been consulted to dose vancomycin. Today is day 10 of vanco (started 2/15), ID recommends treating for 14 days. Pt has ESRD on HD, WBC wnl, afeb.   2/21: VR 16  Plan: Continue Vancomycin 750 mg IV qHD MWF until 2/28 Follow HD schedule Target pre-HD vancomycin level 15-25 mcg/mL   Height: 5\' 10"  (177.8 cm) Weight: 152 lb 5.4 oz (69.1 kg) IBW/kg (Calculated) : 73  Temp (24hrs), Avg:97.7 F (36.5 C), Min:97.5 F (36.4 C), Max:98.3 F (36.8 C)   Recent Labs Lab 05/20/16 0350 05/21/16 1758 05/22/16 0407 05/22/16 0958 05/22/16 1000 05/24/16 0730 05/25/16 0251  WBC 9.0 9.2  --   --  7.7 5.2 5.9  CREATININE 7.21* 5.60*  --  6.48*  --  7.40* 4.98*  VANCORANDOM  --   --  16  --   --   --   --     Estimated Creatinine Clearance: 13.1 mL/min (by C-G formula based on SCr of 4.98 mg/dL (H)).    No Known Allergies   Antimicrobials this admission:  Acyclovir 2/13>>2/19 Cefepime 2/15 >>2/18 Vanc 2/15 >>   Microbiology results:  2/19 CSF: ngF 2/18 BCx: ngF 2/17 UCx: ngF 2/16 MRSA PCR: negative 2/15 BCx: MRSA 1/2 bottles  2/13 CSF: negative, cryptococcal antigen neg, HSV 1 and 2 neg, CMV and JC neg 2/16 HIV: negative 2/13 BCx: ngF 2/7 BCx: ngF   Mackie Paienee Graham Hyun, PharmD PGY1 Pharmacy Resident Pager: [phone] 05/25/2016 1:17 PM

## 2016-05-25 NOTE — Progress Notes (Signed)
Subjective: Patient got combative and somewhat agitated overnight. When evaluated on rounds this morning he was sleeping and somnolent. Unfortunately, his trajectory of improved mental status over the previous 72 hours did not continue today. He was not formally oriented. It is unclear if he was tired and sleeping or if he has again become more altered than prior. Hopefully, he will continue to improve and return to the trajectory he has been on the past several days.   Objective:  Vital signs in last 24 hours: Vitals:   05/24/16 2346 05/25/16 0000 05/25/16 0100 05/25/16 0310  BP: 129/65 (!) 141/78 123/76 (!) 118/97  Pulse: 84 83 72 73  Resp: (!) 22 19  19   Temp: 98.3 F (36.8 C)   97.5 F (36.4 C)  TempSrc: Oral   Oral  SpO2: 97% 97% 98% 99%  Weight:    152 lb 5.4 oz (69.1 kg)  Height:       Physical Exam  Constitutional:  Lying in bed sleeping  Cardiovascular: Normal rate and regular rhythm.   Murmur heard. Holosystolic murmur heard best at the right upper sternal border  Respiratory: Effort normal and breath sounds normal. No respiratory distress. He has no wheezes.  GI: Soft. Bowel sounds are normal. He exhibits no distension.  Musculoskeletal: He exhibits no edema.  Neurological:  Patient alert.      Assessment/Plan:  Principal Problem:   Meningoencephalitis Active Problems:   Type 2 diabetes mellitus (HCC)   Hypertension, renal disease   ESRD on hemodialysis (HCC)   Labile blood glucose   Anemia of chronic disease   Hyperglycemia   Stroke (cerebrum) (HCC)   History of diabetes mellitus   Meningitis   Elevated serum creatinine   Encephalopathy   Hypothermia   Hypoglycemia   MRSA bacteremia  Michael Anane-Brobbeyis a 70 y.o.malewith HTN, DM, ESRD on HD, who presents w/ 5 day h/o altered mental status of unclear etiology.  # Meningoencephalitis  Patient's CSF profile as following: 208 white blood cells with a lymphocytic predominance, decreased  glucose at 36 and increased protein at 241. Repeat lumbar puncture shows the following: 268 white blood cells with a lymphocytic predominance, elevated protein at 193 and elevated glucose at 83. The etiology of the patient's altered mental status remains vague. However, I think the most likely etiology was secondary to viral infection that we are unable to identify with CSF studies. He is more altered today and not improving as he was over the previous 48-72 hours. For now we will continue to await further CSF results, continue his course of vancomycin for MRSA bacteremia and plan for transfer from the stepdown unit to a normal floor. - CSF cytology and flow cytometry, we'll await results from pathology - HSV 1 and 2, CMV, JC virus negative - Appreciate infectious disease recommendations - PT has recommended skilled nursing facility - Transferred to normal floor   # MRSA Bacteremia: 1 of 2 blood cultures drawn 05/16/16 positive. Blood cultures at presentation were negative. Repeat blood cultures have been obtained on 05/19/2016. No evidence of infective endocarditis on physical examination or neuro imaging thus far. Ultrasound of his fistula does not show fluid collection or evidence for infection.Will need a minimum of 2 weeks of vancomycin for MRSA bacteremia. Transthoracic echocardiogram shows left ventricular ejection fraction of 60-65% with moderate aortic stenosis. Aortic stenosis fits clinically with the murmur that was appreciated on auscultation.  -- Vancomycin, will need 2 weeks. Started 05/17/2016. Currently day 8 -- Follow-up blood  cultures -- echocardiogram  # IDDM Patient has labile and difficult to control glucose. - A1c 12.3 (05/08/16) - SSI-S - Levemir 6 units qhs  # Hypertension  Continue with antihypertensive medications as detailed below. - HD per Nephrology - Hydralazine 100 mg po TID - Labetalol 300 mg twice a day - Hydralazine PRN BP > 185/110.   # ESRD on MWF HD: -  Continue HD recommendations per nephrology  DVT/PE prophylaxis: Heparin FEN/GI:Carbmodified  Dispo: Anticipated discharge pending clinical course.   Thomasene LotJames Johntavius Shepard, MD 05/25/2016, 9:21 AM

## 2016-05-26 LAB — ACID FAST SMEAR (AFB): ACID FAST SMEAR - AFSCU2: NEGATIVE

## 2016-05-26 LAB — GLUCOSE, CAPILLARY
GLUCOSE-CAPILLARY: 193 mg/dL — AB (ref 65–99)
GLUCOSE-CAPILLARY: 353 mg/dL — AB (ref 65–99)
Glucose-Capillary: 169 mg/dL — ABNORMAL HIGH (ref 65–99)
Glucose-Capillary: 176 mg/dL — ABNORMAL HIGH (ref 65–99)

## 2016-05-26 LAB — ACID FAST SMEAR (AFB, MYCOBACTERIA)

## 2016-05-26 NOTE — Progress Notes (Signed)
Received in report that patient was in restraints, possey belt.  Upon assessment there was no order for restraints and patient was very compliant and cooperative with my commands.  Decided to d/c restraints at this time and further assess the patient.

## 2016-05-26 NOTE — Progress Notes (Signed)
[facility] Progress Note   Dialysis Orders: MWF AF  4h 2/2 bath P2 LUA AVF 76kg Hep 7100 Venofer 50/wk Mircera 50 q 2, last 2.7 Hect 6 ug  Assessment/Plan:  1.Encephalitis - ^^CSF wbc count with lymphcytosis. Also 1/2 MRSA in blood cx's, for this is getting empiric abtx planned for 2 wks - cause unclear, prob viral infection,  ID following. MS slowly improving.  2. ESRD - MWF - next HD Monday 3. Anemia - hgb 11.2 Aranesp 60/Wed - may need a lower dose if hgb continues to trend up 4. Secondary hyperparathyroidism -  Ca 10-hectorol on hold P ok- 2 Ca bath 5. HTN-  BP's variable-118 systolic - 160s;  on labet/ hydral- UF 242 cc 2/23 6. Volume -  no vol exces on exam - post wt 67.8 sig below edw 6. Nutrition - alb 3 /suppl.prostat/vits 7. DM- BS 100 - 200s 8. Disp - plan to return home in the care of family   Sheffield SliderMartha B Bergman, PA-C [facility] Beeper [phone] 05/26/2016,10:41 AM  LOS: 18 days   Pt seen, examined and agree w A/P as above.  Vinson Moselleob Shraga Custard MD [facility] pager [phone]   05/26/2016, 12:18 PM    Subjective:   "In hospital to get medicine" Objective Vitals:   05/26/16 0024 05/26/16 0500 05/26/16 0529 05/26/16 1034  BP: (!) 150/61  (!) 157/76 121/63  Pulse: 72  71 70  Resp: 16  16 16   Temp: 98.4 F (36.9 C)  98.3 F (36.8 C) 97.4 F (36.3 C)  TempSrc: Oral  Oral Oral  SpO2: 100%  100% 99%  Weight:  71.6 kg (157 lb 13.6 oz)    Height:       Physical Exam General: sleeping but rouses Heart: RRR Lungs: no rales Abdomen: soft NT Extremities: no LE edema Dialysis Access: left upper AVF + bruit   Additional Objective Labs: Basic Metabolic Panel:  Recent Labs Lab 05/20/16 0350 05/21/16 1758 05/22/16 0958 05/24/16 0730 05/25/16 0251  NA 136 135 136 134* 133*  K 4.6 4.1 3.7 4.2 5.0  CL 96* 94* 96* 96* 90*  CO2 28 27 26 25 31   GLUCOSE 189* 220* 175* 169* 184*  BUN 49* 28* 34* 53* 35*   CREATININE 7.21* 5.60* 6.48* 7.40* 4.98*  CALCIUM 10.8* 10.2 10.3 10.0 10.0  PHOS 4.0 4.0 4.1  --   --    Liver Function Tests:  Recent Labs Lab 05/21/16 1758 05/22/16 0958 05/24/16 0730  AST  --   --  17  ALT  --   --  21  ALKPHOS  --   --  65  BILITOT  --   --  0.4  PROT  --   --  6.6  ALBUMIN 3.1* 3.0* 3.0*   CBC:  Recent Labs Lab 05/20/16 0350 05/21/16 1758 05/22/16 1000 05/24/16 0730 05/25/16 0251  WBC 9.0 9.2 7.7 5.2 5.9  HGB 10.4* 10.8* 10.7* 10.2* 11.2*  HCT 31.7* 33.9* 32.8* 31.8* 34.4*  MCV 91.6 93.6 92.7 93.3 94.5  PLT 200 195 176 158 185   Blood Culture    Component Value Date/Time   SDES CSF 05/20/2016 1146   SPECREQUEST NONE 05/20/2016 1146   CULT NO GROWTH 3 DAYS 05/20/2016 1146   REPTSTATUS 05/23/2016 FINAL 05/20/2016 1146   CBG:  Recent Labs Lab 05/24/16 2244 05/25/16 1255 05/25/16 1711 05/25/16 2255 05/26/16 0624  GLUCAP 251* 152* 239* 169* 169*   Medications:  . aspirin  81 mg Oral Daily  . darbepoetin (ARANESP) injection - DIALYSIS  60 mcg Intravenous Q Wed-HD  . feeding supplement (ENSURE ENLIVE)  237 mL Oral BID BM  . feeding supplement (PRO-STAT SUGAR FREE 64)  30 mL Oral BID  . ferric citrate  210 mg Oral TID WC  . heparin  5,000 Units Subcutaneous Q8H  . hydrALAZINE  100 mg Oral Q8H  . insulin aspart  0-9 Units Subcutaneous TID WC  . insulin detemir  6 Units Subcutaneous QHS  . labetalol  300 mg Oral BID  . multivitamin  1 tablet Oral QHS  . polyethylene glycol  17 g Oral Daily  . vancomycin  750 mg Intravenous Q M,W,F-HD

## 2016-05-26 NOTE — Progress Notes (Signed)
Subjective:  Patient awake, alert, and interactive this morning. He is oriented to person and knows he is at [facility]. He is unable to tell me the year or month. He says his family stopped by last night and they think he is improving. He has breakfast in front of him and is feeding himself and drinking orange juice. He says he is not hungry but has to eat. He feels fine and asks me how I am doing. He says he slept well. He has no complaints.  Objective:  Vital signs in last 24 hours: Vitals:   05/25/16 2202 05/26/16 0024 05/26/16 0500 05/26/16 0529  BP: (!) 164/75 (!) 150/61  (!) 157/76  Pulse: 79 72  71  Resp: 16 16  16   Temp: 97.9 F (36.6 C) 98.4 F (36.9 C)  98.3 F (36.8 C)  TempSrc: Oral Oral  Oral  SpO2: 99% 100%  100%  Weight:   157 lb 13.6 oz (71.6 kg)   Height:       General: resting in bed, no acute distress Cardiac: RRR, systolic murmur heard throughout, loudest at LUS border Pulm: clear to auscultation bilaterally Ext: warm and well perfused, no pedal edema, AVF LUE Neuro: alert and oriented to person and place, finger grip intact, strength 5/5 b/l upper extremities   Assessment/Plan:  Principal Problem:   Meningoencephalitis Active Problems:   Type 2 diabetes mellitus (HCC)   Hypertension, renal disease   ESRD on hemodialysis (HCC)   Labile blood glucose   Anemia of chronic disease   Hyperglycemia   Stroke (cerebrum) (HCC)   History of diabetes mellitus   Meningitis   Elevated serum creatinine   Encephalopathy   Hypothermia   Hypoglycemia   MRSA bacteremia  Mr.Tadarrius Anane-Brobbeyis a 71 y.o.malewith HTN, DM, ESRD on HD, who presents w/ 5 day h/o altered mental status of unclear etiology.  # Meningoencephalitis  Patient's CSF profile as following: 208 white blood cells with a lymphocytic predominance, decreased glucose at 36 and increased protein at 241. Repeat lumbar puncture shows the following: 268 white blood cells with a lymphocytic  predominance, elevated protein at 193 and elevated glucose at 83. The etiology of the patient's altered mental status remains vague, however the most likely etiology was secondary to viral infection that we are unable to identify with CSF studies. He is more altered today and look about as well as he has or better during his hospital stay. I suspect his lethargy yesterday morning was due to delirium and poor sleep from the prior night. For now we will continue to await further CSF results, continue his course of vancomycin for MRSA bacteremia. If his condition remains stable, I anticipate he can be discharged in the next 1-2 days. - CSF cytology and flow cytometry, we'll await results from pathology - HSV 1 and 2, CMV, JC virus negative - Appreciate infectious disease recommendations - PT has recommended SNF, but patient does not qualify; will need home health services on discharge  # MRSA Bacteremia: 1 of 2 blood cultures drawn 05/16/16 positive. Blood cultures at presentation were negative. Repeat blood cultures have been obtained on 05/19/2016. No evidence of infective endocarditis on physical examination or neuro imaging thus far. Ultrasound of his fistula does not show fluid collection or evidence for infection.Will need a minimum of 2 weeks of vancomycin for MRSA bacteremia. Transthoracic echocardiogram shows left ventricular ejection fraction of 60-65% with moderate aortic stenosis. -- Vancomycin with HD, will need 2 weeks. Started 05/16/2016.  Currently day 11. -- Follow-up blood cultures NGTD  # IDDM - A1c 12.3 (05/08/16) - SSI-S - Levemir 6 units qhs  # Hypertension  Continue with antihypertensive medications as detailed below. - HD per Nephrology - Hydralazine 100 mg po TID - Labetalol 300 mg twice a day - Hydralazine PRN BP > 185/110.   # ESRD on MWF HD: - Continue HD recommendations per nephrology  DVT/PE prophylaxis: Heparin FEN/GI:Carbmodified  Dispo: Anticipated  discharge pending clinical course.   Darreld Mclean, MD 05/26/2016, 9:23 AM

## 2016-05-27 LAB — CBC
HCT: 29.9 % — ABNORMAL LOW (ref 39.0–52.0)
HEMOGLOBIN: 9.7 g/dL — AB (ref 13.0–17.0)
MCH: 30.1 pg (ref 26.0–34.0)
MCHC: 32.4 g/dL (ref 30.0–36.0)
MCV: 92.9 fL (ref 78.0–100.0)
PLATELETS: 157 10*3/uL (ref 150–400)
RBC: 3.22 MIL/uL — AB (ref 4.22–5.81)
RDW: 17 % — ABNORMAL HIGH (ref 11.5–15.5)
WBC: 4.6 10*3/uL (ref 4.0–10.5)

## 2016-05-27 LAB — RENAL FUNCTION PANEL
ANION GAP: 12 (ref 5–15)
Albumin: 3.1 g/dL — ABNORMAL LOW (ref 3.5–5.0)
BUN: 80 mg/dL — ABNORMAL HIGH (ref 6–20)
CHLORIDE: 95 mmol/L — AB (ref 101–111)
CO2: 28 mmol/L (ref 22–32)
CREATININE: 8.29 mg/dL — AB (ref 0.61–1.24)
Calcium: 10 mg/dL (ref 8.9–10.3)
GFR, EST AFRICAN AMERICAN: 7 mL/min — AB (ref >60)
GFR, EST NON AFRICAN AMERICAN: 6 mL/min — AB (ref >60)
Glucose, Bld: 153 mg/dL — ABNORMAL HIGH (ref 65–99)
Phosphorus: 5 mg/dL — ABNORMAL HIGH (ref 2.5–4.6)
Potassium: 4.7 mmol/L (ref 3.5–5.1)
Sodium: 135 mmol/L (ref 135–145)

## 2016-05-27 LAB — GLUCOSE, CAPILLARY
GLUCOSE-CAPILLARY: 153 mg/dL — AB (ref 65–99)
GLUCOSE-CAPILLARY: 100 mg/dL — AB (ref 65–99)
Glucose-Capillary: 173 mg/dL — ABNORMAL HIGH (ref 65–99)
Glucose-Capillary: 282 mg/dL — ABNORMAL HIGH (ref 65–99)

## 2016-05-27 LAB — MISC LABCORP TEST (SEND OUT): Labcorp test code: 821231

## 2016-05-27 MED ORDER — SODIUM CHLORIDE 0.9 % IV SOLN: 4000.0000 mg | INTRAVENOUS | Status: DC

## 2016-05-27 MED ORDER — SODIUM CHLORIDE 0.9 % IV SOLN
1.2500 mg/kg | INTRAVENOUS | Status: DC
  Administered 2016-05-27 – 2016-05-29 (×2): 85 mg via INTRAVENOUS
  Filled 2016-05-27 (×3): qty 85

## 2016-05-27 MED ORDER — SODIUM CHLORIDE 0.9 % IV SOLN
4000.0000 mg | INTRAVENOUS | Status: DC
  Administered 2016-05-27 – 2016-05-29 (×2): 4000 mg via INTRAVENOUS
  Filled 2016-05-27 (×3): qty 166.67

## 2016-05-27 MED ORDER — VANCOMYCIN HCL IN DEXTROSE 750-5 MG/150ML-% IV SOLN
INTRAVENOUS | Status: AC
  Administered 2016-05-27: 750 mg via INTRAVENOUS
  Filled 2016-05-27: qty 150

## 2016-05-27 NOTE — Progress Notes (Signed)
PT Cancellation Note  Patient Details Name: Michael BookmanJohn Dorsey MRN: [id number] DOB: 09/21/1945   Cancelled Treatment:    Reason Eval/Treat Not Completed: Fatigue/lethargy limiting ability to participate (Pt sleeping on arrival after arousing patient, patient refused PT treatment he reports he will walk tomorrow.  Pt had dialysis earlier and likley fatigued from tx.  )   Kassaundra Hair Artis DelayJ Alga Southall 05/27/2016, 3:52 PM Joycelyn RuaAimee Miri Jose, PTA pager [phone]

## 2016-05-27 NOTE — Progress Notes (Signed)
Inpatient Diabetes Program Recommendations  AACE/ADA: New Consensus Statement on Inpatient Glycemic Control (2015)  Target Ranges:  Prepandial:   less than 140 mg/dL      Peak postprandial:   less than 180 mg/dL (1-2 hours)      Critically ill patients:  140 - 180 mg/dL   Lab Results  Component Value Date   GLUCAP 100 (H) 05/27/2016   HGBA1C 12.3 (H) 05/08/2016   Results for Caesar BookmanNANE-BROBBEY, Adil (MRN [id number]) as of 05/27/2016 15:00  Ref. Range 05/26/2016 11:22 05/26/2016 16:27 05/26/2016 22:37 05/27/2016 06:36 05/27/2016 13:03  Glucose-Capillary Latest Ref Range: 65 - 99 mg/dL 409193 (H) 811353 (H)  2 Ensure Enlive shakes prior to CBG) 176 (H) 153 (H) 100 (H)   Noted the following:  Insulin - Basal: Basal home dose is NPH 28 units (0.7 * 20 units bid).  Currently ordered Levemir 6 units qhs (CBG's controlled).  Correction (SSI): Agree with Novolog 0-9 units tidac.  Insulin - Meal Coverage: Home meal coverage is Novolin 12 units None ordered currently.  HgbA1C: A1C has increased to 12.3% on 05/03/2016 from 11.8% on 07/13/2015.  Diet: Noted that patient is receiving Ensure Enlive.  Please consider Gucerna Supplements.  Thank you,  Kristine LineaKaren Meral Geissinger, RN, MSN Diabetes Coordinator Inpatient Diabetes Program [phone] (Team Pager)

## 2016-05-27 NOTE — Progress Notes (Signed)
[facility]    Date of Admission:  05/08/2016   Total days of antibiotics 12                           ID: Michael BookmanJohn Dorsey is a 71 y.o. male with ESRD, DM, admitted on 2/7 with encephalopathy, LP found to have lymphocytic predominance pleocytosis with CSF WBC 268, 96%L, high protein, low glucose, culture negative from CSF. Also found to have 1/4 bottles of blood cx for MRSA Principal Problem:   Meningoencephalitis Active Problems:   Type 2 diabetes mellitus (HCC)   Hypertension, renal disease   ESRD on hemodialysis (HCC)   Labile blood glucose   Anemia of chronic disease   Hyperglycemia   Stroke (cerebrum) (HCC)   History of diabetes mellitus   Meningitis   Elevated serum creatinine   Encephalopathy   Hypothermia   Hypoglycemia   MRSA bacteremia    Subjective: Continues to wax and waning pattern  24hr: labs resulted from 1 week ago show + HHV 6  ROS:  Denies headache or fever Medications:  . aspirin  81 mg Oral Daily  . darbepoetin (ARANESP) injection - DIALYSIS  60 mcg Intravenous Q Wed-HD  . feeding supplement (ENSURE ENLIVE)  237 mL Oral BID BM  . feeding supplement (PRO-STAT SUGAR FREE 64)  30 mL Oral BID  . ferric citrate  210 mg Oral TID WC  . heparin  5,000 Units Subcutaneous Q8H  . hydrALAZINE  100 mg Oral Q8H  . insulin aspart  0-9 Units Subcutaneous TID WC  . insulin detemir  6 Units Subcutaneous QHS  . labetalol  300 mg Oral BID  . multivitamin  1 tablet Oral QHS  . polyethylene glycol  17 g Oral Daily  . vancomycin  750 mg Intravenous Q M,W,F-HD    Objective: Vital signs in last 24 hours: Temp:  [97.2 F (36.2 C)-97.8 F (36.6 C)] 97.3 F (36.3 C) (02/26 1253) Pulse Rate:  [52-79] 62 (02/26 1253) Resp:  [15-20] 16 (02/26 1253) BP: (97-173)/(29-129) 146/69 (02/26 1253) SpO2:  [97 %-100 %] 98 % (02/26 1211) Weight:  [154 lb 1.6 oz (69.9 kg)-156 lb 12 oz (71.1 kg)] 154 lb 1.6 oz (69.9 kg) (02/26 1211) gen = laying  in bed, eyes open tracking HEENT: no scleral icterus pulm = CTAB no W/C/R Cors= nl s1,s2, SEM heard throughout abd = Soft, NTND Ext: trace edema Neuro - moves all extremities, answers 1/2 of questions correctly  Lab Results  Recent Labs  05/25/16 0251 05/27/16 0611  WBC 5.9 4.6  HGB 11.2* 9.7*  HCT 34.4* 29.9*  NA 133* 135  K 5.0 4.7  CL 90* 95*  CO2 31 28  BUN 35* 80*  CREATININE 4.98* 8.29*   Liver Panel  Recent Labs  05/27/16 0611  ALBUMIN 3.1*    Microbiology: 2/18 blood cx ngtd 2/15 blood cx 1 in 4 bottles MRSA ( vanco nad cefepime started)  2/13 blood cx 2/2 sets NGTD  2/13 CSF cx NGTD (acyclovir started) 2/7 blood cx NGTD 2/12 RPR 1:1 2/13 VDRL CSF negative 2/13 HSV 1/2 negative 2/13 CMV negative 2/13 JC negative HIV negative CrAg negative Studies/Results: No results found.   Assessment/Plan: 71yo M with ESRD on HD, HTN, DM2, dementia admitted on encephalopathy since 2/7. Repeat studies have not found infectious source, and found to have MRSA in 1 of 4 bottles on HD #7.   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 Enterovirus panel and HSV negative as is VDRL, crypto. mTB negative. 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 Studies suggest rapid improvement with treatment. Will do a trial of ganciclovir and foscarnet to see if we get same response since he has waxing-waning pattern, not clear improvement. Will have pharmacy do renal dosing  - will check serology titers for HHV-6  MRSA bacteremia = only found in 1 of 4 bottles but truly 1 in 8 bottles since he had prior blood cx 2 days prior to the positive cultures. I think this is incidental. Not likely to be cause of presentation.treat for 2 wk with vancomycin with HD. 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 Shahani L. BMJ Case Rep. 2014 Sep 19;2014. pii: [id number]. doi: 10.1136/[id number]. 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 [address] [place], [place], [place], [place], [place].   [facility] Cell: [phone] Pager: [phone]  05/27/2016, 1:15 PM

## 2016-05-27 NOTE — Progress Notes (Signed)
Subjective:  Patient seen at hemodialysis this morning. He was awake and alert to self, location, birth date and age. He still does not know the month or year. His mental status continues to wax and wane depending on when he is evaluated. Overall, he has progressed and seems much improved from admission. He had no pain complaints today.  Objective:  Vital signs in last 24 hours: Vitals:   05/27/16 1030 05/27/16 1045 05/27/16 1100 05/27/16 1115  BP: 97/66 108/66 (!) 132/29 135/75  Pulse: 64 64 72 73  Resp:      Temp:      TempSrc:      SpO2:      Weight:      Height:       Physical Exam  Constitutional:  At hemodialysis  Cardiovascular: Normal rate and regular rhythm.   Murmur heard. Holosystolic murmur  Respiratory: Effort normal and breath sounds normal. No respiratory distress. He has no wheezes.  Musculoskeletal: He exhibits no edema.  Neurological: He is alert.  Alert. Oriented to self and place. Not oriented to time.      Assessment/Plan:  Principal Problem:   Meningoencephalitis Active Problems:   Type 2 diabetes mellitus (HCC)   Hypertension, renal disease   ESRD on hemodialysis (HCC)   Labile blood glucose   Anemia of chronic disease   Hyperglycemia   Stroke (cerebrum) (HCC)   History of diabetes mellitus   Meningitis   Elevated serum creatinine   Encephalopathy   Hypothermia   Hypoglycemia   MRSA bacteremia  Michael Anane-Brobbeyis a 71 y.o.malewith HTN, DM, ESRD on HD, who presents w/ 5 day h/o altered mental status of unclear etiology.  # Meningoencephalitis  Patient's CSF profile as following: 208 white blood cells with a lymphocytic predominance, decreased glucose at 36 and increased protein at 241. Repeat lumbar puncture shows the following: 268 white blood cells with a lymphocytic predominance, elevated protein at 193 and elevated glucose at 83. The etiology of the patient's altered mental status remains vague, however the most likely  etiology was secondary to viral infection that we are unable to identify with CSF studies. He remains alert and oriented to person and place. He continues to progress. At this time I do not think we are doing any other interventions for the patient and should plan for discharge pending his mental status. If he continues to improve over the next 24-48 hours he may be appropriate. For now we'll continue to monitor and follow-up remaining of CSF studies. Overall, I think the most likely cause of the patient's meningoencephalitis was secondary to viral infection. - CSF cytology and flow cytometry, we'll await results from pathology - HSV 1 and 2, CMV, JC virus negative - Appreciate infectious disease recommendations - PT has recommended SNF, but patient does not qualify; will need home health services on discharge  # MRSA Bacteremia: 1 of 2 blood cultures drawn 05/16/16 positive. Blood cultures at presentation were negative. Repeat blood cultures have been obtained on 05/19/2016. No evidence of infective endocarditis on physical examination or neuro imaging thus far. Ultrasound of his fistula does not show fluid collection or evidence for infection.Will need a minimum of 2 weeks of vancomycin for MRSA bacteremia. Transthoracic echocardiogram shows left ventricular ejection fraction of 60-65% with moderate aortic stenosis. -- Vancomycin with HD, will need 2 weeks. Started 05/16/2016. Currently day 12. -- Follow-up blood cultures NGTD  # IDDM - A1c 12.3 (05/08/16) - SSI-S - Levemir 6 units qhs  #  Hypertension  Continue with antihypertensive medications as detailed below. - HD per Nephrology - Hydralazine 100 mg po TID - Labetalol 300 mg twice a day - Hydralazine PRN BP > 185/110.   # ESRD on MWF HD: - Continue HD recommendations per nephrology  DVT/PE prophylaxis: Heparin FEN/GI:Carbmodified  Dispo: Anticipated discharge pending clinical course.   [name], MD 05/27/2016, 11:36  AM

## 2016-05-27 NOTE — Progress Notes (Signed)
Pt. Left unit for dialysis 0750

## 2016-05-27 NOTE — Progress Notes (Signed)
[facility] Progress Note   Subjective:   On HD, a bit agitated, not lying still Objective Vitals:   05/27/16 0930 05/27/16 0945 05/27/16 1000 05/27/16 1030  BP: 107/67 106/62 117/65 97/66  Pulse: (!) 53 70 73 64  Resp:      Temp:      TempSrc:      SpO2:      Weight:      Height:       Physical Exam General: awake, responsive Heart: RRR Lungs: no rales Abdomen: soft NT Extremities: no LE edema Dialysis Access: left upper AVF + bruit Neuro: Ox 1 only, not to place , year, day   Dialysis Orders: MWF [facility]  4h 2/2 bath P2 LUA AVF 76kg Hep 7100 Venofer 50/wk Mircera 50 q 2, last 2.7 Hect 6 ug  Assessment/Plan:  1.Encephalitis - ^^CSF wbc count with lymphcytosis. Cause unclear, prob viral infection, ID following. Also 1/2 MRSA in blood cx's, for this is getting empiric abtx planned for 2 wks.  MS slowly improving.  2. ESRD - MWF HD. HD today.  3. Anemia - hgb 11.2 Aranesp 60/Wed - may need a lower dose if hgb continues to trend up 4. Secondary hyperparathyroidism -  Ca 10-hectorol on hold P ok- 2 Ca bath 5. HTN-  BP's variable-118 systolic - 160s;  on labet/ hydral- UF 242 cc 2/23 6. Volume -  no vol exces on exam, not eating much, under dry wt by 6kg 6. Nutrition - alb 3 /suppl.prostat/vits 7. DM- BS 100 - 200s 8. Disp - plan to return home in the care of family    Vinson MoselleRob Hasnain Manheim MD [facility] pager [phone]   05/27/2016, 10:53 AM  Additional Objective Labs: Basic Metabolic Panel:  Recent Labs Lab 05/21/16 1758 05/22/16 0958 05/24/16 0730 05/25/16 0251 05/27/16 0611  NA 135 136 134* 133* 135  K 4.1 3.7 4.2 5.0 4.7  CL 94* 96* 96* 90* 95*  CO2 27 26 25 31 28   GLUCOSE 220* 175* 169* 184* 153*  BUN 28* 34* 53* 35* 80*  CREATININE 5.60* 6.48* 7.40* 4.98* 8.29*  CALCIUM 10.2 10.3 10.0 10.0 10.0  PHOS 4.0 4.1  --   --  5.0*   Liver Function Tests:  Recent Labs Lab 05/22/16 0958 05/24/16 0730 05/27/16 0611  AST   --  17  --   ALT  --  21  --   ALKPHOS  --  65  --   BILITOT  --  0.4  --   PROT  --  6.6  --   ALBUMIN 3.0* 3.0* 3.1*   CBC:  Recent Labs Lab 05/21/16 1758 05/22/16 1000 05/24/16 0730 05/25/16 0251 05/27/16 0611  WBC 9.2 7.7 5.2 5.9 4.6  HGB 10.8* 10.7* 10.2* 11.2* 9.7*  HCT 33.9* 32.8* 31.8* 34.4* 29.9*  MCV 93.6 92.7 93.3 94.5 92.9  PLT 195 176 158 185 157   Blood Culture    Component Value Date/Time   SDES CSF 05/20/2016 1146   SPECREQUEST NONE 05/20/2016 1146   CULT NO GROWTH 3 DAYS 05/20/2016 1146   REPTSTATUS 05/23/2016 FINAL 05/20/2016 1146   CBG:  Recent Labs Lab 05/26/16 0624 05/26/16 1122 05/26/16 1627 05/26/16 2237 05/27/16 0636  GLUCAP 169* 193* 353* 176* 153*   Medications:  . aspirin  81 mg Oral Daily  . darbepoetin (ARANESP) injection - DIALYSIS  60 mcg Intravenous Q Wed-HD  . feeding supplement (ENSURE ENLIVE)  237 mL Oral BID BM  .  feeding supplement (PRO-STAT SUGAR FREE 64)  30 mL Oral BID  . ferric citrate  210 mg Oral TID WC  . heparin  5,000 Units Subcutaneous Q8H  . hydrALAZINE  100 mg Oral Q8H  . insulin aspart  0-9 Units Subcutaneous TID WC  . insulin detemir  6 Units Subcutaneous QHS  . labetalol  300 mg Oral BID  . multivitamin  1 tablet Oral QHS  . polyethylene glycol  17 g Oral Daily  . vancomycin  750 mg Intravenous Q M,W,F-HD

## 2016-05-27 NOTE — Progress Notes (Signed)
OT Cancellation Note  Patient Details Name: Michael BookmanJohn Anane-Brobbey MRN: [id number] DOB: January 07, 1946   Cancelled Treatment:    Reason Eval/Treat Not Completed: Patient at procedure or test/ unavailable. Pt off unit for HD. Will check back as able and continue to follow.  Doristine Sectionharity A Mikela Senn, MS OTR/L  Pager: [phone]    Doristine SectionCharity A Nikko Goldwire 05/27/2016, 9:19 AM

## 2016-05-27 NOTE — Progress Notes (Signed)
PT Cancellation Note  Patient Details Name: Michael BookmanJohn Anane-Brobbey MRN: [id number] DOB: [birth year]   Cancelled Treatment:    Reason Eval/Treat Not Completed: Other (comment) (Pt is back from dialysis but just received his tray will f/u per POC.  )   Astin Sayre Artis DelayJ Dannis Deroche 05/27/2016, 2:15 PM Joycelyn RuaAimee Thoms Barthelemy, PTA pager [phone]

## 2016-05-27 NOTE — Progress Notes (Signed)
Received report from dialysis nurse prior to pt arrival.

## 2016-05-27 NOTE — Progress Notes (Signed)
Pt returned from dialysis with no noted distress. Dressing noted to left upper arm dry and intact. Pt denies pain or discomfort. Safety measures in place. Call bell within reach. Will continue to monitor.

## 2016-05-28 DIAGNOSIS — B1001 Human herpesvirus 6 encephalitis: Secondary | ICD-10-CM

## 2016-05-28 LAB — GLUCOSE, CAPILLARY
GLUCOSE-CAPILLARY: 184 mg/dL — AB (ref 65–99)
GLUCOSE-CAPILLARY: 306 mg/dL — AB (ref 65–99)
Glucose-Capillary: 229 mg/dL — ABNORMAL HIGH (ref 65–99)
Glucose-Capillary: 103 mg/dL — ABNORMAL HIGH (ref 65–99)

## 2016-05-28 LAB — ARBOVIRUS PANEL, ~~LOC~~ LAB

## 2016-05-28 LAB — MISC LABCORP TEST (SEND OUT)
Labcorp test code: 138529
Labcorp test code: 138529

## 2016-05-28 MED ORDER — NEPRO/CARBSTEADY PO LIQD
237.0000 mL | Freq: Three times a day (TID) | ORAL | Status: DC
  Administered 2016-05-28 – 2016-05-30 (×5): 237 mL via ORAL
  Filled 2016-05-28 (×13): qty 237

## 2016-05-28 NOTE — Progress Notes (Signed)
Physical Therapy Treatment Patient Details Name: Michael BookmanJohn Dorsey MRN: [id number] DOB: 1945/06/03 Today's Date: 05/28/2016    History of Present Illness 71 year old man with a history of end-stage renal disease requiring hemodialysis, diabetes, hypertension, and hyperlipidemia who presents with a five-day history of altered mental status. MRI on 2/9 showed no acute intracranial abnormality.     PT Comments    Good progress noted with mobility. Increased gait distance, increased balance and improved stability in stance.   Follow Up Recommendations  Supervision/Assistance - 24 hour;SNF     Equipment Recommendations  Rolling walker with 5" wheels    Recommendations for Other Services       Precautions / Restrictions Precautions Precautions: Fall    Mobility  Bed Mobility     Rolling: Modified independent (Device/Increase time)   Supine to sit: Min assist     General bed mobility comments: increased time, continuous verbal cues, assist to elevate trunk  Transfers   Equipment used: Rolling walker (2 wheeled)   Sit to Stand: Min assist Stand pivot transfers: Min guard       General transfer comment: increased time, verbal/tactile cues for hand placement, assist to power up  Ambulation/Gait Ambulation/Gait assistance: Min assist;+2 safety/equipment Ambulation Distance (Feet): 150 Feet Assistive device: Rolling walker (2 wheeled) Gait Pattern/deviations: Step-through pattern;Decreased stride length Gait velocity: decreased Gait velocity interpretation: Below normal speed for age/gender General Gait Details: slow, steady gait; occassional min assist for RW management primarily during turns and in tight spaces   Stairs            Wheelchair Mobility    Modified Rankin (Stroke Patients Only)       Balance   Sitting-balance support: No upper extremity supported;Feet supported Sitting balance-Leahy Scale: Good     Standing balance support:  Bilateral upper extremity supported;During functional activity Standing balance-Leahy Scale: Fair                      Cognition Arousal/Alertness: Awake/alert Behavior During Therapy: WFL for tasks assessed/performed Overall Cognitive Status: Impaired/Different from baseline Area of Impairment: Attention;Memory;Following commands;Safety/judgement;Awareness;Problem solving Orientation Level: Disoriented to;Situation Current Attention Level: Sustained Memory: Decreased short-term memory Following Commands: Follows one step commands inconsistently;Follows one step commands with increased time Safety/Judgement: Decreased awareness of safety;Decreased awareness of deficits Awareness: Emergent Problem Solving: Slow processing;Decreased initiation;Difficulty sequencing;Requires verbal cues;Requires tactile cues      Exercises      General Comments        Pertinent Vitals/Pain Pain Assessment: Faces Faces Pain Scale: No hurt    Home Living                      Prior Function            PT Goals (current goals can now be found in the care plan section) Acute Rehab PT Goals Patient Stated Goal: None stated PT Goal Formulation: Patient unable to participate in goal setting Time For Goal Achievement: 06/06/16 Potential to Achieve Goals: Good Progress towards PT goals: Progressing toward goals    Frequency    Min 3X/week      PT Plan Current plan remains appropriate    Co-evaluation             End of Session Equipment Utilized During Treatment: Gait belt Activity Tolerance: Patient tolerated treatment well Patient left: in chair;with call bell/phone within reach;with chair alarm set Nurse Communication: Mobility status PT Visit Diagnosis: Unsteadiness on feet (R26.81)  Time: 8119-1478 PT Time Calculation (min) (ACUTE ONLY): 26 min  Charges:  $Gait Training: 23-37 mins                    G Codes:       Ilda Foil 05/28/2016,  9:33 AM

## 2016-05-28 NOTE — Progress Notes (Signed)
Michael Dorsey Progress Note   Subjective:   Seen in room. Hyperfixated on teeth brushing while working with OT. Denies CP or dyspnea. Calm today.  Objective Vitals:   05/27/16 2123 05/28/16 0153 05/28/16 0554 05/28/16 0835  BP: (!) 115/54 131/82 (!) 174/72 (!) 161/80  Pulse: 65 62 64 67  Resp: 16 18 16 20   Temp: 97.7 F (36.5 C) 98 F (36.7 C) 97.7 F (36.5 C) 97.9 F (36.6 C)  TempSrc: Oral Oral Oral Oral  SpO2: 100% 100% 100% 100%  Weight:   71.2 kg (156 lb 15.5 oz)   Height:       Physical Exam General: Well appearing, NAD. Oriented x 2. Heart:RRR; no murmur. Lungs: CTA anteriorly Extremities: No LE edema Dialysis Access:  LUE AVF, + bruit.  Additional Objective Labs: Basic Metabolic Panel:  Recent Labs Lab 05/21/16 1758 05/22/16 0958 05/24/16 0730 05/25/16 0251 05/27/16 0611  NA 135 136 134* 133* 135  K 4.1 3.7 4.2 5.0 4.7  CL 94* 96* 96* 90* 95*  CO2 27 26 25 31 28   GLUCOSE 220* 175* 169* 184* 153*  BUN 28* 34* 53* 35* 80*  CREATININE 5.60* 6.48* 7.40* 4.98* 8.29*  CALCIUM 10.2 10.3 10.0 10.0 10.0  PHOS 4.0 4.1  --   --  5.0*   Liver Function Tests:  Recent Labs Lab 05/22/16 0958 05/24/16 0730 05/27/16 0611  AST  --  17  --   ALT  --  21  --   ALKPHOS  --  65  --   BILITOT  --  0.4  --   PROT  --  6.6  --   ALBUMIN 3.0* 3.0* 3.1*   CBC:  Recent Labs Lab 05/21/16 1758 05/22/16 1000 05/24/16 0730 05/25/16 0251 05/27/16 0611  WBC 9.2 7.7 5.2 5.9 4.6  HGB 10.8* 10.7* 10.2* 11.2* 9.7*  HCT 33.9* 32.8* 31.8* 34.4* 29.9*  MCV 93.6 92.7 93.3 94.5 92.9  PLT 195 176 158 185 157   CBG:  Recent Labs Lab 05/27/16 0636 05/27/16 1303 05/27/16 1613 05/27/16 2126 05/28/16 0629  GLUCAP 153* 100* 173* 282* 229*   Medications:  . aspirin  81 mg Oral Daily  . darbepoetin (ARANESP) injection - DIALYSIS  60 mcg Intravenous Q Wed-HD  . feeding supplement (ENSURE ENLIVE)  237 mL Oral BID BM  . feeding supplement (PRO-STAT SUGAR  FREE 64)  30 mL Oral BID  . ferric citrate  210 mg Oral TID WC  . foscarnet (FOSCAVIR) IVPB  4,000 mg Intravenous Q M,W,F-1800  . ganciclovir (CYTOVENE) IV  1.25 mg/kg Intravenous Once per day on Mon Wed Fri  . heparin  5,000 Units Subcutaneous Q8H  . hydrALAZINE  100 mg Oral Q8H  . insulin aspart  0-9 Units Subcutaneous TID WC  . insulin detemir  6 Units Subcutaneous QHS  . labetalol  300 mg Oral BID  . multivitamin  1 tablet Oral QHS  . polyethylene glycol  17 g Oral Daily  . vancomycin  750 mg Intravenous Q M,W,F-HD    Dialysis Orders: MWF at [facility] 4h 2/2 bath P2 LUA AVF 76kg Hep 7100 - Venofer 50/wk - Mircera 50 q 2, last 2/7 - Hect IV q HD  Assessment/Plan: 1. Encephalitis: CSF with increased WBC count. ID following. 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 Started on Foscarnet and Ganciclovir. Also 1/ 6 BCx grew MRSA. On Vancomycin x 2 weeks. Mental status slowly improving. 2. ESRD: Continue HD on MWF schedule,  next 2/28. 3. Anemia; Hgb 9.7, continue Aranesp 60mcg q Wed.  4. Secondary hyperparathyroidism: Phos ok, no Auryxia. Hectoral on hold d/t high Corr Ca. 5.HTN/volume: BP variable, no edema. Last post-HD weight ~70kg. Monitor. On hydralazine/labetalol. 6. Nutrition:Alb 3.1. Continue protein supplements. 7. DM: On insulin, per primary. 8. Disp: plans to return home in the care of family.  Michael HoyleKatie Dorsey, Michael Dorsey 05/28/2016, 10:17 AM  [facility] Pager: [phone]  Pt seen, examined and agree w A/P as above.  Michael Moselleob Tymeka Dorsey [facility] pager [phone]   05/28/2016, 12:26 PM

## 2016-05-28 NOTE — Progress Notes (Signed)
Michael Dorsey    Date of Admission:  05/08/2016   Total days of antibiotics 13                           ID: Michael BookmanJohn Dorsey is a 71 y.o. male with ESRD, DM, admitted on 2/7 with encephalopathy, LP found to have lymphocytic predominance pleocytosis with CSF WBC 268, 96%L, high protein, low glucose, culture negative from CSF. Also found to have 1/4 bottles of blood cx for MRSA Principal Problem:   Meningoencephalitis Active Problems:   Type 2 diabetes mellitus (HCC)   Hypertension, renal Dorsey   ESRD on hemodialysis (HCC)   Labile blood glucose   Anemia of chronic Dorsey   Hyperglycemia   Stroke (cerebrum) (HCC)   History of diabetes mellitus   Meningitis   Elevated serum creatinine   Encephalopathy   Hypothermia   Hypoglycemia   MRSA bacteremia    Subjective: Afebrile, brushing his teeth for an extended time at the sink with OT. He ambulated roughly 150 feet with PT this morning. He still feels not quite back to his baseline. He is a x o by 3, person, place, month. Mild body aches after infusions yesterday  24hr: labs resulted from 1 week ago show + HHV 6  ROS:  Denies headache or fever Medications:  . aspirin  81 mg Oral Daily  . darbepoetin (ARANESP) injection - DIALYSIS  60 mcg Intravenous Q Wed-HD  . feeding supplement (ENSURE ENLIVE)  237 mL Oral BID BM  . feeding supplement (PRO-STAT SUGAR FREE 64)  30 mL Oral BID  . ferric citrate  210 mg Oral TID WC  . foscarnet (FOSCAVIR) IVPB  4,000 mg Intravenous Q M,W,F-1800  . ganciclovir (CYTOVENE) IV  1.25 mg/kg Intravenous Once per day on Mon Wed Fri  . heparin  5,000 Units Subcutaneous Q8H  . hydrALAZINE  100 mg Oral Q8H  . insulin aspart  0-9 Units Subcutaneous TID WC  . insulin detemir  6 Units Subcutaneous QHS  . labetalol  300 mg Oral BID  . multivitamin  1 tablet Oral QHS  . polyethylene glycol  17 g Oral Daily  . vancomycin  750 mg Intravenous Q M,W,F-HD    Objective: Vital  signs in last 24 hours: Temp:  [97.3 F (36.3 C)-98.8 F (37.1 C)] 97.9 F (36.6 C) (02/27 0835) Pulse Rate:  [52-87] 87 (02/27 1025) Resp:  [15-20] 18 (02/27 1025) BP: (115-187)/(54-129) 187/68 (02/27 1025) SpO2:  [98 %-100 %] 100 % (02/27 1025) Weight:  [154 lb 1.6 oz (69.9 kg)-156 lb 15.5 oz (71.2 kg)] 156 lb 15.5 oz (71.2 kg) (02/27 0554) gen = standing by the sink brushing his teeth HEENT: no scleral icterus pulm = CTAB no W/C/R Cors= nl s1,s2, SEM heard throughout abd = Soft, NTND Ext: trace edema Neuro - moves all extremities, answers questions appropriately but there is cognitive slowing  Lab Results  Recent Labs  05/27/16 0611  WBC 4.6  HGB 9.7*  HCT 29.9*  NA 135  K 4.7  CL 95*  CO2 28  BUN 80*  CREATININE 8.29*   Liver Panel  Recent Labs  05/27/16 0611  ALBUMIN 3.1*    Microbiology: 2/18 blood cx ngtd 2/15 blood cx 1 in 4 bottles MRSA ( vanco nad cefepime started)  2/13 blood cx 2/2 sets NGTD  2/13 CSF cx NGTD (acyclovir started) 2/7 blood cx NGTD 2/12 RPR 1:1 2/13 VDRL  CSF negative 2/13 HSV 1/2 negative 2/13 CMV negative 2/13 JC negative HIV negative CrAg negative HHV 6 POSITIVE Studies/Results: No results found.   Assessment/Plan: 71yo M with ESRD on HD, HTN, DM2, dementia admitted on encephalopathy since 2/7. Repeat studies have not found infectious source, and found to have MRSA in 1 of 4 bottles on HD #7.   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 mTB negative. However his HHV-6 positive  - rare to have HHV-6 encephalitis in immunocompetent host. Studies suggest rapid improvement with treatment. Will do a trial of ganciclovir and foscarnet to see if we get same response since he has waxing-waning pattern, not clear improvement. Will have pharmacy do renal dosing. Will recommend to do 3 QOD doses to see if he improves (with  dialysis). If improvement, may warrant to get central line to treat for 3 wks.  - will check serology titers for HHV-6  MRSA bacteremia = only found in 1 of 4 bottles but truly 1 in 8 bottles since he had prior blood cx 2 days prior to the positive cultures. I think this is incidental. Not likely to be cause of presentation.treat for 2 wk with vancomycin with HD. Currently day 12 of 14 of treatment    Michael Dorsey, [facility] Cell: [phone] Pager: [phone]  05/28/2016, 11:29 AM

## 2016-05-28 NOTE — Progress Notes (Signed)
Occupational Therapy Treatment Patient Details Name: Michael BookmanJohn Dorsey MRN: [id number] DOB: [birth year] Today's Date: 05/28/2016    History of present illness 71 year old man with a history of end-stage renal disease requiring hemodialysis, diabetes, hypertension, and hyperlipidemia who presents with a five-day history of altered mental status. MRI on 2/9 showed no acute intracranial abnormality.    OT comments  Pt progressing toward OT goals. He was able to complete toilet transfers with min assist and complete oral care at sink with min assist. Pt requires maximum multimodal cues for initiation and sequencing and demonstrated perseveration during oral care tasks. He demonstrated difficulty following one-step commands as well. Will continue to follow acutely. D/C plan remains appropriate.   Follow Up Recommendations  SNF;Supervision/Assistance - 24 hour    Equipment Recommendations  Other (comment) (TBD at next venue of care)    Recommendations for Other Services      Precautions / Restrictions Precautions Precautions: Fall Restrictions Weight Bearing Restrictions: No       Mobility Bed Mobility     Rolling: Modified independent (Device/Increase time)   Supine to sit: Min assist     General bed mobility comments: OOB in chair on OT arrival.  Transfers Overall transfer level: Needs assistance Equipment used: Rolling walker (2 wheeled) Transfers: Sit to/from Stand Sit to Stand: Min guard Stand pivot transfers: Min guard       General transfer comment: Min guard for safety wtih multimodal cues for hand placement, initiation, and sequencing.    Balance Overall balance assessment: Needs assistance Sitting-balance support: No upper extremity supported;Feet supported Sitting balance-Leahy Scale: Good     Standing balance support: No upper extremity supported;Bilateral upper extremity supported;During functional activity Standing balance-Leahy Scale: Fair Standing  balance comment: Able to stand at sink with min assist to brush teeth without UE support.                   ADL Overall ADL's : Needs assistance/impaired     Grooming: Oral care;Minimal assistance;Standing Grooming Details (indicate cue type and reason): Max multimodal cues for initiation of task. Perseveration noted with all aspects.                 Toilet Transfer: BSC;Ambulation;Minimal assistance           Functional mobility during ADLs: Rolling walker;Minimal assistance        Vision                 Additional Comments: Able to use vision functionally this session but did mistake dynamap for sink. Difficult to determine if this was a visual vs cognitive concern.   Perception     Praxis      Cognition   Behavior During Therapy: WFL for tasks assessed/performed Overall Cognitive Status: Impaired/Different from baseline Area of Impairment: Attention;Memory;Following commands;Safety/judgement;Awareness;Problem solving Orientation Level: Disoriented to;Situation;Time Current Attention Level: Sustained Memory: Decreased short-term memory  Following Commands: Follows one step commands inconsistently;Follows one step commands with increased time Safety/Judgement: Decreased awareness of safety;Decreased awareness of deficits Awareness: Emergent Problem Solving: Slow processing;Decreased initiation;Difficulty sequencing;Requires verbal cues;Requires tactile cues General Comments: Pt with difficulty remembering where he was walking after standing up to brush his teeth at the sink. Initially thinking dynamap in corner of the room was the sink.      Exercises     Shoulder Instructions       General Comments      Pertinent Vitals/ Pain       Pain Assessment: No/denies pain Faces  Pain Scale: No hurt  Home Living                                          Prior Functioning/Environment              Frequency  Min 2X/week         Progress Toward Goals  OT Goals(current goals can now be found in the care plan section)  Progress towards OT goals: Progressing toward goals  Acute Rehab OT Goals Patient Stated Goal: None stated OT Goal Formulation: Patient unable to participate in goal setting Time For Goal Achievement: 05/24/16 Potential to Achieve Goals: Good ADL Goals Pt Will Perform Grooming: with supervision;sitting Additional ADL Goal #1: Pt will follow one step command 75% of the time in a non distracting environment. Additional ADL Goal #2: Pt will correctly identify 3/5 ADL items with min verbal cues.  Plan Discharge plan remains appropriate    Co-evaluation                 End of Session Equipment Utilized During Treatment: Gait belt;Rolling walker  OT Visit Diagnosis: Unsteadiness on feet (R26.81);Other abnormalities of gait and mobility (R26.89);Other symptoms and signs involving cognitive function   Activity Tolerance Patient tolerated treatment well   Patient Left in chair;with call bell/phone within reach;with chair alarm set (with MD in room)   Nurse Communication          Time: [phone] OT Time Calculation (min): 23 min  Charges: OT General Charges $OT Visit: 1 Procedure OT Treatments $Self Care/Home Management : 23-37 mins  Doristine Section, MS OTR/L  Pager: [phone]    Michael Dorsey 05/28/2016, 10:52 AM

## 2016-05-28 NOTE — Progress Notes (Signed)
Nutrition Follow-up  INTERVENTION:  Nepro Shake po TID between meals, each supplement provides 425 kcal and 19 grams protein   NUTRITION DIAGNOSIS:   Inadequate oral intake related to other (see comment), chronic illness (altered mental status) as evidenced by meal completion < 25%, moderate depletions of muscle mass.  ongoing  GOAL:   Patient will meet greater than or equal to 90% of their needs  Improving  MONITOR:   PO intake, Supplement acceptance, Labs, Weight trends  REASON FOR ASSESSMENT:   LOS, Low Braden    ASSESSMENT:   71yo M with ESRD on HD, HTN, DM2, dementia admitted for encephalopathy since 2/7. Found to have MRSA  Pt confused/lethargic at time of visit. Lunch tray at bedside (1500 hr) untouched. Pt states that he isn't hungry, but denies any abdominal discomfort, nausea, or diarrhea. RD asked if there are foods that he likes that he would like to receive on dinner tray and pt stated "not now". RD asked if he was eating Magic Cup ice cream with meals and he stated again "not now". RD encouraged pt to drink a nutritional supplement if he is skipping meals; handed patient bottle of Ensure and he took a few sips.  Weight stable with some fluctuation.   Labs: Glucose ranging 100 to 353 mg/dL, low hemoglobin, low chloride, high creatinine, high phosphorus, low GFR  Diet Order:  Diet Carb Modified Fluid consistency: Thin; Room service appropriate? Yes  Skin:  Reviewed, no issues  Last BM:  2/26  Height:   Ht Readings from Last 1 Encounters:  05/08/16 5\' 10"  (1.778 m)    Weight:   Wt Readings from Last 1 Encounters:  05/28/16 156 lb 15.5 oz (71.2 kg)    Ideal Body Weight:  75.4 kg  BMI:  Body mass index is 22.52 kg/m.  Estimated Nutritional Needs:   Kcal:  1800-2100kcal/day   Protein:  85-99g/day   Fluid:  >1.8L/day  EDUCATION NEEDS:   No education needs identified at this time  Dorothea Ogleeanne Khaniyah Bezek RD, LDN, CSP Inpatient Clinical  Dietitian Pager: [phone] After Hours Pager: [phone]

## 2016-05-28 NOTE — Progress Notes (Signed)
   Subjective:  No acute events overnight. Patient was sitting up in his bed eating breakfast this morning when evaluated. This is the most alert and interactive I have seen him. He still struggles with formal orientation questions but overall his mental status is greatly improved.  Objective:  Vital signs in last 24 hours: Vitals:   05/27/16 1414 05/27/16 2123 05/28/16 0153 05/28/16 0554  BP: (!) 150/68 (!) 115/54 131/82 (!) 174/72  Pulse: 62 65 62 64  Resp: 17 16 18 16   Temp: 98.8 F (37.1 C) 97.7 F (36.5 C) 98 F (36.7 C) 97.7 F (36.5 C)  TempSrc: Oral Oral Oral Oral  SpO2: 98% 100% 100% 100%  Weight:    156 lb 15.5 oz (71.2 kg)  Height:       Physical Exam  Cardiovascular: Normal rate and regular rhythm.   Murmur heard. Holosystolic murmur  Respiratory: Effort normal and breath sounds normal. No respiratory distress. He has no wheezes.  Musculoskeletal: He exhibits no edema.  Neurological: He is alert.  Alert. More interactive than prior.      Assessment/Plan:  Principal Problem:   Meningoencephalitis Active Problems:   Type 2 diabetes mellitus (HCC)   Hypertension, renal disease   ESRD on hemodialysis (HCC)   Labile blood glucose   Anemia of chronic disease   Hyperglycemia   Stroke (cerebrum) (HCC)   History of diabetes mellitus   Meningitis   Elevated serum creatinine   Encephalopathy   Hypothermia   Hypoglycemia   MRSA bacteremia  Michael Anane-Brobbeyis a 71 y.o.malewith HTN, DM, ESRD on HD, who presents w/ 5 day h/o altered mental status of unclear etiology.  # Meningoencephalitis  Patient's CSF profiles on both occasions has been consistent with viral infection. 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 Per infectious disease this would be rare in an immunocompetent host but has been reported prior in the literature. He was started on ganciclovir and foscarnet. This morning his mental status does seem improved. Difficult to assess the  temporal relationship between his improving mental status and initiation of viral therapy. We will continue to monitor over the next 24-48 hours. If the patient continues to improve clinically we will arrange for discharge in the coming days. -- Ganciclovir and foscarnet  # MRSA Bacteremia: 1 of 2 blood cultures drawn 05/16/16 positive. Blood cultures at presentation were negative. Repeat blood cultures have been obtained on 05/19/2016. No evidence of infective endocarditis on physical examination or neuro imaging thus far. Ultrasound of his fistula does not show fluid collection or evidence for infection.Will need a minimum of 2 weeks of vancomycin for MRSA bacteremia. Transthoracic echocardiogram shows left ventricular ejection fraction of 60-65% with moderate aortic stenosis. Tomorrow will complete his course of vancomycin. -- Vancomycin with HD, will need 2 weeks. Started 05/16/2016. Currently day 13. -- Tomorrow will be last dose of vancomycin -- Follow-up blood cultures NGTD  # IDDM - A1c 12.3 (05/08/16) - SSI-S - Levemir 6 units qhs  # Hypertension  Continue with antihypertensive medications as detailed below. - HD per Nephrology - Hydralazine 100 mg po TID - Labetalol 300 mg twice a day - Hydralazine PRN BP > 185/110.   # ESRD on MWF HD: - Continue HD recommendations per nephrology  DVT/PE prophylaxis: Heparin FEN/GI:Carbmodified  Dispo: Anticipated discharge pending clinical course.   Michael LotJames King Pinzon, MD 05/28/2016, 7:28 AM

## 2016-05-28 NOTE — Care Management Note (Signed)
Case Management Note  Patient Details  Name: Michael BookmanJohn Dorsey MRN: [id number] Date of Birth: 09-May-1945  Subjective/Objective:                    Action/Plan: CM reconsulted CSW for potential rehab at discharge. CM following for d/c needs.   Expected Discharge Date:                  Expected Discharge Plan:  Home w Home Health Services  In-House Referral:     Discharge planning Services  CM Consult  Post Acute Care Choice:    Choice offered to:     DME Arranged:    DME Agency:     HH Arranged:    HH Agency:     Status of Service:  In process, will continue to follow  If discussed at Long Length of Stay Meetings, dates discussed:    Additional Comments:  [name], RN 05/28/2016, 4:16 PM

## 2016-05-28 NOTE — Progress Notes (Signed)
Inpatient Diabetes Program Recommendations  AACE/ADA: New Consensus Statement on Inpatient Glycemic Control (2015)  Target Ranges:  Prepandial:   less than 140 mg/dL      Peak postprandial:   less than 180 mg/dL (1-2 hours)      Critically ill patients:  140 - 180 mg/dL   Lab Results  Component Value Date   GLUCAP 229 (H) 05/28/2016   HGBA1C 12.3 (H) 05/08/2016    Review of Glycemic Control Results for Michael Dorsey, Michael Dorsey (MRN [id number]) as of 05/28/2016 11:24  Ref. Range 05/27/2016 16:13 05/27/2016 21:26 05/28/2016 06:29  Glucose-Capillary Latest Ref Range: 65 - 99 mg/dL 213173 (H) 086282 (H) 578229 (H)   Inpatient Diabetes Program Recommendations: Noted postprandial CBGs elevated post prandial/nutritional supplements.Please consider adding 4 units Novolog meal coverage tid if eats >50 or drinks supplement.  Thank you, Billy FischerJudy E. Orena Cavazos, RN, MSN, CDE Inpatient Glycemic Control Team Team Pager [phone] (8am-5pm) 05/28/2016 11:32 AM

## 2016-05-29 LAB — CBC
HCT: 29.3 % — ABNORMAL LOW (ref 39.0–52.0)
Hemoglobin: 9.5 g/dL — ABNORMAL LOW (ref 13.0–17.0)
MCH: 30.1 pg (ref 26.0–34.0)
MCHC: 32.4 g/dL (ref 30.0–36.0)
MCV: 92.7 fL (ref 78.0–100.0)
Platelets: 168 10*3/uL (ref 150–400)
RBC: 3.16 MIL/uL — ABNORMAL LOW (ref 4.22–5.81)
RDW: 17 % — ABNORMAL HIGH (ref 11.5–15.5)
WBC: 5.4 10*3/uL (ref 4.0–10.5)

## 2016-05-29 LAB — GLUCOSE, CAPILLARY
GLUCOSE-CAPILLARY: 161 mg/dL — AB (ref 65–99)
Glucose-Capillary: 197 mg/dL — ABNORMAL HIGH (ref 65–99)
Glucose-Capillary: 146 mg/dL — ABNORMAL HIGH (ref 65–99)

## 2016-05-29 LAB — RENAL FUNCTION PANEL
Albumin: 3.2 g/dL — ABNORMAL LOW (ref 3.5–5.0)
Anion gap: 11 (ref 5–15)
BUN: 63 mg/dL — ABNORMAL HIGH (ref 6–20)
CO2: 30 mmol/L (ref 22–32)
Calcium: 9.2 mg/dL (ref 8.9–10.3)
Chloride: 97 mmol/L — ABNORMAL LOW (ref 101–111)
Creatinine, Ser: 6.86 mg/dL — ABNORMAL HIGH (ref 0.61–1.24)
GFR calc Af Amer: 8 mL/min — ABNORMAL LOW (ref >60)
GFR calc non Af Amer: 7 mL/min — ABNORMAL LOW (ref >60)
Glucose, Bld: 123 mg/dL — ABNORMAL HIGH (ref 65–99)
Phosphorus: 3.2 mg/dL (ref 2.5–4.6)
Potassium: 4 mmol/L (ref 3.5–5.1)
Sodium: 138 mmol/L (ref 135–145)

## 2016-05-29 MED ORDER — VANCOMYCIN HCL IN DEXTROSE 750-5 MG/150ML-% IV SOLN
INTRAVENOUS | Status: AC
  Administered 2016-05-29: 750 mg via INTRAVENOUS
  Filled 2016-05-29: qty 150

## 2016-05-29 MED ORDER — SODIUM CHLORIDE 0.9 % IV SOLN: 100.0000 mL | INTRAVENOUS | Status: DC | PRN

## 2016-05-29 MED ORDER — HEPARIN SODIUM (PORCINE) 1000 UNIT/ML DIALYSIS
1000.0000 [IU] | INTRAMUSCULAR | Status: DC | PRN
  Filled 2016-05-29: qty 1

## 2016-05-29 MED ORDER — LIDOCAINE HCL (PF) 1 % IJ SOLN: 5.0000 mL | INTRAMUSCULAR | Status: DC | PRN

## 2016-05-29 MED ORDER — ALTEPLASE 2 MG IJ SOLR
2.0000 mg | Freq: Once | INTRAMUSCULAR | Status: DC | PRN
  Filled 2016-05-29: qty 2

## 2016-05-29 MED ORDER — DARBEPOETIN ALFA 60 MCG/0.3ML IJ SOSY
PREFILLED_SYRINGE | INTRAMUSCULAR | Status: AC
  Administered 2016-05-29: 60 ug via INTRAVENOUS
  Filled 2016-05-29: qty 0.3

## 2016-05-29 MED ORDER — LIDOCAINE-PRILOCAINE 2.5-2.5 % EX CREA: 1.0000 "application " | TOPICAL_CREAM | CUTANEOUS | Status: DC | PRN

## 2016-05-29 MED ORDER — PENTAFLUOROPROP-TETRAFLUOROETH EX AERO: 1.0000 "application " | INHALATION_SPRAY | CUTANEOUS | Status: DC | PRN

## 2016-05-29 MED ORDER — HEPARIN SODIUM (PORCINE) 1000 UNIT/ML DIALYSIS
20.0000 [IU]/kg | INTRAMUSCULAR | Status: DC | PRN
  Filled 2016-05-29: qty 2

## 2016-05-29 NOTE — Progress Notes (Signed)
SLP Cancellation Note  Patient Details Name: Michael Dorsey MRN: [id number] DOB: 08-21-1945   Cancelled treatment:        Pt in HD. Will continue efforts                                                                                                Anissia Wessells, Riley NearingBonnie Caroline 05/29/2016, 10:10 AM

## 2016-05-29 NOTE — Discharge Summary (Signed)
Name: Michael Dorsey MRN: [id number] DOB: Aug 11, 1945 71 y.o. PCP: Arnoldo Morale, MD  Date of Admission: 05/08/2016  2:27 PM Date of Discharge: 05/31/2016 Attending Physician: Lalla Brothers, MD  Discharge Diagnosis: 1. Meningoencephalitis 2. End-stage renal disease 3. Hypertension 4. Type 2 diabetes mellitus 5. MRSA bacteremia 6. Elevated PSA  Principal Problem:   Meningoencephalitis Active Problems:   Type 2 diabetes mellitus (HCC)   Hypertension, renal disease   ESRD on hemodialysis (HCC)   Labile blood glucose   Anemia of chronic disease   Hyperglycemia   Stroke (cerebrum) (HCC)   History of diabetes mellitus   Meningitis   Elevated serum creatinine   Encephalopathy   Hypothermia   Hypoglycemia   MRSA bacteremia   Discharge Medications: Allergies as of 05/31/2016   No Known Allergies     Medication List    STOP taking these medications   acetaminophen-codeine 300-30 MG tablet Commonly known as:  TYLENOL #3   carvedilol 25 MG tablet Commonly known as:  COREG   furosemide 80 MG tablet Commonly known as:  LASIX   isosorbide mononitrate 60 MG 24 hr tablet Commonly known as:  IMDUR   oxyCODONE-acetaminophen 5-325 MG tablet Commonly known as:  PERCOCET/ROXICET     TAKE these medications   acetaminophen 500 MG tablet Commonly known as:  TYLENOL Take 500 mg by mouth every 6 (six) hours as needed for moderate pain or headache.   aspirin 81 MG tablet Take 81 mg by mouth daily.   atorvastatin 40 MG tablet Commonly known as:  LIPITOR Take 1 tablet (40 mg total) by mouth daily.   calcitRIOL 0.25 MCG capsule Commonly known as:  ROCALTROL Take 1 capsule (0.25 mcg total) by mouth daily.   calcium acetate 667 MG capsule Commonly known as:  PHOSLO TAKE 1 CAPSULE BY MOUTH 2 TIMES DAILY.   hydrALAZINE 50 MG tablet Commonly known as:  APRESOLINE TAKE 1 TABLET BY MOUTH EVERY 8 HOURS. What changed:  how much to take  how to take this  when to take  this  additional instructions   labetalol 300 MG tablet Commonly known as:  NORMODYNE Take 1 tablet (300 mg total) by mouth 2 (two) times daily.   multivitamin Tabs tablet Take 1 tablet by mouth at bedtime.   NOVOLIN 70/30 (70-30) 100 UNIT/ML injection Generic drug:  insulin NPH-regular Human INJECT 20 UNITS INTO THE SKIN 2 TIMES DAILY WITH A MEAL.   polyethylene glycol packet Commonly known as:  MIRALAX / GLYCOLAX Take 17 g by mouth 2 (two) times daily. What changed:  when to take this  reasons to take this   [place] w/Device Kit USE AS INSTRUCTED   TRUEPLUS LANCETS 28G Misc USE AS INSTRUCTED            Durable Medical Equipment        Start     Ordered   05/31/16 1527  For home use only DME 3 n 1  Once     05/31/16 1527   05/31/16 1519  For home use only DME Walker rolling  Once    Question:  Patient needs a walker to treat with the following condition  Answer:  Balance problem   05/31/16 1518      Disposition and follow-up:   Mr.Contrell Anane-Brobbey was discharged from [facility] in Stable condition.  At the hospital follow up visit please address:  1.  Meningoencephalitis - Please assess mental status, memory, attention, and orientation. Assess  mobility, functional status at home, and family's ability to care for him with help of home health services. He was able to answer simple questions and oriented to self and place at discharge.  ESRD on HD MWF - assess dialysis adherence  Hypertension - assess BP control  Type 2 diabetes mellitus - HbA1c checked and elevated at 12.7 during hospital stay (05/08/16) - assess glycemic control and adjust insulin regimen accordingly  MRSA bacteremia - assess for any recurrent fevers or hypothermic episodes  Elevated PSA - enlarged prostate found on imaging and PSA 37 during hospital stay - assess for urinary obstruction symptoms and consider repeat PSA and potential prostate cancer workup    2.  Labs / imaging needed at time of follow-up: BMP, PSA  3.  Pending labs/ test needing follow-up: None  Follow-up Appointments: Follow-up Information    Arnoldo Morale, MD Follow up.   Specialty:  Family Medicine Why:  Please call and make an appointment for a follow-up visit in 1 week.  Contact information: [place] [address] [place] [facility] Course by problem list: Principal Problem:   Meningoencephalitis Active Problems:   Type 2 diabetes mellitus ([place])   Hypertension, renal disease   ESRD on hemodialysis ([place])   Labile blood glucose   Anemia of chronic disease   Hyperglycemia   Stroke (cerebrum) (HCC)   History of diabetes mellitus   Meningitis   Elevated serum creatinine   Encephalopathy   Hypothermia   Hypoglycemia   MRSA bacteremia   1. Meningoencephalitis The patient initially presented to the [facility] emergency department with altered mental status on 04/07/2016.At  his initial presentation a code stroke was called. The patient was evaluated by neurology and had appropriate neuroimaging and was ruled out for acute infarct. At his initial presentation he had missed several dialysis appointments and his BUN and creatinine were elevated above baseline. There was concern that his altered mental status was secondary to uremia and he was dialyzed. After dialysis and return of his BUN and creatinine to baseline levels the patient remained altered. Over the next several days the patient's altered mental status progressed to the point where he became averbal and lethargic. Laboratory evaluation was unremarkable. Repeat MRI was unremarkable and showed no evidence or etiology of the patient's altered mental status. The patient then had a lumbar puncture which showed a lymphocytic leukocytosis. On his initial lumbar puncture results were as follows 208 white blood cells with a lymphocytic predominance, decreased glucose at 36 and  increased protein at 241. The patient's CSF was sent for numerous studies including culture, Gram stain, VZV and HSV PCR, VDRL and many other send out tests. The results of the CSF studies were negative. The patient continued to have altered mental status and remained minimally responsive over the following several days. During his hospital course he developed hypothermia requiring a bear hugger with transition from a normal floor to the stepdown unit. At this time blood cultures were obtained which showed MRSA bacteremia in one of 2 cultures. For more details on this please see below under point #4. He was then started on vancomycin. The patient continued to be altered without improvement and infectious disease was consulted. ID recommended repeat lumbar puncture with additional studies on the CSF. The patient underwent a repeat lumbar puncture which showed the following: 268 white blood cells again with a lymphocytic predominance, elevated protein at 193 and elevated glucose 83.  CSF cytology and flow cytometry were normal. CMV and JC virus were negative. Additional CSF studies were negative. Starting around 05/22/16 the ensuing several days the patient's mental status spontaneously began to improve, becoming conversational, interactive, and consistently answering questions, although with moderate fluctuating confusion. Eventually the patients CSF resulted positive for HHV-6. He was started on a combination of Ganciclovir andFoscarnet under guidance by infectious disease specialists starting 2/26. Initially his mental status seemed to slightly improve, however after 5 days of treatment he remained much the same and these antiviral medications were discontinued on 3/2. Since admission his mental status had greatly improved and he was near baseline and consistently answering questions, although he remained confused about his location and the year and had some short term memory deficits. He worked with PT/OT/SLP  throughout his hospital staty and was recommended for SNF / 24hr supervision/assistance due to his persistent cognitive and balance issues. However patient had no insurance or medicaid/medicare, and a compromise of discharge home under family supervision with extensive home health assistance was achieved by discharge date 3/2.  2. End-stage renal disease The patient has end-stage renal disease. At his initial presentation he had missed several dialysis sessions and had increased BUN and creatinine from baseline. Nephrology was consulted and the patient was taken to dialysis. His normal schedule is Monday Wednesday Friday. After several dialysis sessions and resolution of his acutely increased BUN and creatinine the patient resumed his normal hemodialysis schedule, Monday Wednesday Friday, while inpatient. He will continue with this at [facility] in the outpatient setting.  3. Hypertension At the patient's initial presentation he was markedly hypertensive. There was concern that his altered mental status may have been secondary to hypertensive emergency versus PRESS syndrome. His blood pressure normalized with dialysis and reinitiation of his antihypertensive medicines. MRI was not consistent with PRESS syndrome. His mental status did not improve with controlling his blood pressure. Once he had caught up on his dialysis his blood pressure control was much improved. We continued hydralazine 100 mg by mouth 3 times a day and nephrology added labetalol 300 mg twice a day. The labetalol will be a new medicine at discharge. He blood pressure remained well-controlled without the addition of his home medication Imdur, so this was never restarted and discontinued from his med list at discharge.  4. Type 2 diabetes mellitus Poorly controlled with recent A1c 11+. Presented with moderate hyperglycemia and blood glucose in 400s without an anion gap. This resolved with IV fluids and Novolin  70/30 10 units BID and sliding scale insulin. HbA1c was rechecked on admission (2/7) and found to be 12.3. During his hospital stay, inconsistent po intake and several mild episodes of hypoglycemia prompted adjustment of his insulin regimen to Levemir 6 units QHS and sliding scale insulin on 2/16. His po intake improved with as his mental status improved during his hospital stay. Levemir and SSI was continued to achieve adequate glycemic control until day of discharge. He was discharged on his prior insulin regimen - Novolin 70/30 20 units BID with meals.   5. MRSA bacteremia On 05/16/2016 the patient became hypothermic with decreased level of consciousness. At that time he was transferred from a normal floor to the stepdown unit and placed on a bear hugger. Blood cultures were obtained. One of 2 blood cultures drawn from 05/16/2016 was positive for MRSA. Importantly, the patient had blood cultures obtained at presentation which were negative. Following the results of this positive blood culture he was  started on vancomycin. Repeat blood cultures on 05/19/2016 were negative. Clinically, he had no evidence of infective endocarditis. He underwent transthoracic echocardiogram which showed no evidence of vegetation. Overall, he has had multiple blood cultures drawn with only 1 showing MRSA. He was started on vancomycin. He was treated for 2 weeks, and completed this course on 05/29/16, and he remained euthermic through the remainder of his stay.  6. Elevated PSA Patient had a CT chest abdomen pelvis with contrast to evaluate for potential malignancy or nidus of infection. CT revealed an enlarged prostate. Follow-up prostate-specific antigen was elevated at 37.75. The patient may need repeat PSA in the outpatient setting and follow-up of this lab value. This will certainly depend on his clinical course and recovery following his meningoencephalitis.  Discharge Vitals:   BP 136/72 (BP Location: Right Arm)   Pulse  74   Temp 97.9 F (36.6 C) (Oral)   Resp 18   Ht _0  (1.778 m)   Wt 153 lb 10.6 oz (69.7 kg)   SpO2 99%   BMI 22.05 kg/m   Pertinent Labs, Studies, and Procedures:  1. CT head-age indeterminate left basal ganglia lacunar infarct. Moderate to severe global brain again atrophy and moderate to severe chronic small vessel ischemic disease. 2. MRI brain-no acute cranial of her malady, severe age advanced atrophy and diffuse white matter disease. Asymmetric ischemic changes and left basal ganglia. Maxillary sinus disease. 3. Repeat MRI brain-no acute intracranial process however severely motion degraded. 4. CT chest abdomen and pelvis with contrast: . Small focus of consolidation in the inferior segment of the lingula concerning for a small focus of pneumonia.No other acute findings are noted elsewhere in the chest, abdomen or pelvis Prostatomegaly with median lobe hypertrophy in the prostate gland. This is associated with some circumferential thickening of the urinary bladder which suggests bladder outlet obstruction. Cardiomegaly with left ventricular hypertrophy.There are calcifications of the aortic valve. Echocardiographiccorrelation for evaluation potential valvular dysfunction may bewarranted if clinically indicated. Aortic atherosclerosis, in addition to left main and 2 vesselcoronary artery disease. Assessment for potential risk factormodification, dietary therapy or pharmacologic therapy may bewarranted, if clinically indicated. Additional incidental findings, as above. 5. Echocardiogram-left ventricular ejection fraction 60-65%. 6. Lumbar puncture 2 performed under fluoroscopy by interventional radiology  Discharge Instructions:   Mr. Mcmahill it was a pleasure taking care of you.   Please take your medications as instructed.  You have been setup for home health nursing, aid, and social work.   Please call and make an appointment with your primary care doctor for a visit in  1 week.   Please go for dialysis at [facility] on Monday (06/03/2016).   Signed: Asencion Partridge, MD 05/31/2016, 9:36 PM   Pager: [phone]

## 2016-05-29 NOTE — Clinical Social Work Note (Addendum)
CSW acknowledges consult for SNF placement. CSW discussed case with Chiropodistassistant director of social work. Patient continues to not be appropriate for LOG due to distance ambulating and support at home. Plan continues to be home with home health.  CSW signing off. Consult again if any other social work needs arise.  Charlynn CourtSarah Valleri Hendricksen, CSW [phone]

## 2016-05-29 NOTE — Progress Notes (Signed)
   Subjective:  No acute events. Patient seen at dialysis. Continues to be alert. He was oriented to self and place today. Not oriented to time. No new events.  Objective:  Vital signs in last 24 hours: Vitals:   05/29/16 1015 05/29/16 1020 05/29/16 1030 05/29/16 1100  BP: (!) 49/26 (!) 147/67 (!) 103/58 138/77  Pulse: 65 71 68 85  Resp:      Temp:      TempSrc:      SpO2:      Weight:      Height:       Physical Exam  Constitutional:  Seen at dialysis resting comfortably  Cardiovascular: Normal rate and regular rhythm.   Murmur heard. Holosystolic murmur  Respiratory: Effort normal and breath sounds normal. No respiratory distress. He has no wheezes.  Musculoskeletal: He exhibits no edema.  Neurological: He is alert.  Alert. More interactive than prior.      Assessment/Plan:  Principal Problem:   Meningoencephalitis Active Problems:   Type 2 diabetes mellitus (HCC)   Hypertension, renal disease   ESRD on hemodialysis (HCC)   Labile blood glucose   Anemia of chronic disease   Hyperglycemia   Stroke (cerebrum) (HCC)   History of diabetes mellitus   Meningitis   Elevated serum creatinine   Encephalopathy   Hypothermia   Hypoglycemia   MRSA bacteremia  Michael Anane-Brobbeyis a 70 y.o.malewith HTN, DM, ESRD on HD, who presents w/ 5 day h/o altered mental status of unclear etiology.  # Meningoencephalitis  Patient's CSF profiles on both occasions has been consistent with viral infection. Laboratory results showed HHV-6. Per infectious disease this would be rare in an immunocompetent host but has been reported prior in the literature. He was started on ganciclovir and foscarnet. This morning his mental status does seem improved. Difficult to assess the temporal relationship between his improving mental status and initiation of viral therapy. We will continue to monitor over the next 24-48 hours. If the patient continues to improve clinically we will arrange for  discharge in the coming days. -- Ganciclovir and foscarnet -- If DC'd on this regimen will need PICC line  # MRSA Bacteremia: 1 of 2 blood cultures drawn 05/16/16 positive. Blood cultures at presentation were negative. Repeat blood cultures have been obtained on 05/19/2016. No evidence of infective endocarditis on physical examination or neuro imaging thus far. Ultrasound of his fistula does not show fluid collection or evidence for infection.Will need a minimum of 2 weeks of vancomycin for MRSA bacteremia. Transthoracic echocardiogram shows left ventricular ejection fraction of 60-65% with moderate aortic stenosis. Discontinue vancomycin tomorrow. -- Vancomycin with HD, will need 2 weeks. Started 05/16/2016. Currently day 14. We'll discontinue vancomycin tomorrow. -- Follow-up blood cultures NGTD  # IDDM - A1c 12.3 (05/08/16) - SSI-S - Levemir 6 units qhs  # Hypertension  Continue with antihypertensive medications as detailed below. Currently normotensive. - HD per Nephrology - Hydralazine 100 mg po TID - Labetalol 300 mg twice a day - Hydralazine PRN BP > 185/110.   # ESRD on MWF HD: - Continue HD recommendations per nephrology  DVT/PE prophylaxis: Heparin FEN/GI:Carbmodified  Dispo: Anticipated discharge pending clinical course 1-3 days.    Michael LotJames Doyle Kunath, MD 05/29/2016, 11:35 AM

## 2016-05-29 NOTE — Progress Notes (Signed)
PT Cancellation Note  Patient Details Name: Michael Dorsey MRN: [id number] DOB: [birth year]   Cancelled Treatment:    Reason Eval/Treat Not Completed: Patient at procedure or test/unavailable; patient in hemodialysis.  Will attempt again as time permits.    Elray McgregorCynthia Nijah Orlich 05/29/2016, 10:15 AM  Sheran Lawlessyndi Mikel Pyon, PT [phone] 05/29/2016

## 2016-05-29 NOTE — Procedures (Signed)
[facility] HD[facility], reminded him.  No edema on exam.     I was present at this dialysis session, have reviewed the session itself and made  appropriate changes Vinson Moselleob Britiany Silbernagel MD [facility] pager [phone]   05/29/2016, 9:38 AM

## 2016-05-30 LAB — GLUCOSE, CAPILLARY
GLUCOSE-CAPILLARY: 131 mg/dL — AB (ref 65–99)
Glucose-Capillary: 252 mg/dL — ABNORMAL HIGH (ref 65–99)
Glucose-Capillary: 65 mg/dL (ref 65–99)
Glucose-Capillary: 127 mg/dL — ABNORMAL HIGH (ref 65–99)

## 2016-05-30 MED ORDER — DOXERCALCIFEROL 4 MCG/2ML IV SOLN
3.0000 ug | INTRAVENOUS | Status: DC
  Administered 2016-05-31: 3 ug via INTRAVENOUS
  Filled 2016-05-30: qty 2

## 2016-05-30 MED ORDER — ISOSORBIDE MONONITRATE ER 60 MG PO TB24: 90.0000 mg | ORAL_TABLET | Freq: Every day | ORAL | Status: DC

## 2016-05-30 NOTE — Progress Notes (Signed)
[facility] Progress Note   Subjective:   Seen. Primary team in room.  HD yesterday - removed only 100 ccs ? BP high this AM.  Reportedly clinically improved  Objective Vitals:   05/29/16 1334 05/29/16 1816 05/29/16 2128 05/30/16 0501  BP: (!) 145/67 (!) 154/86 (!) 151/73 (!) 168/74  Pulse: 68 64 72 79  Resp:  18 18 18   Temp: 97.4 F (36.3 C) 97.7 F (36.5 C) 98.4 F (36.9 C) 98.2 F (36.8 C)  TempSrc: Oral Oral Oral Oral  SpO2: 100% 100% 100% 100%  Weight:    72.9 kg (160 lb 11.5 oz)  Height:       Physical Exam General: Well appearing, NAD. Oriented x 2. Heart:RRR; no murmur. Lungs: CTA anteriorly Extremities: No LE edema Dialysis Access:  LUE AVF, + bruit.  Additional Objective Labs: Basic Metabolic Panel:  Recent Labs Lab 05/25/16 0251 05/27/16 0611 05/29/16 0815  NA 133* 135 138  K 5.0 4.7 4.0  CL 90* 95* 97*  CO2 31 28 30   GLUCOSE 184* 153* 123*  BUN 35* 80* 63*  CREATININE 4.98* 8.29* 6.86*  CALCIUM 10.0 10.0 9.2  PHOS  --  5.0* 3.2   Liver Function Tests:  Recent Labs Lab 05/24/16 0730 05/27/16 0611 05/29/16 0815  AST 17  --   --   ALT 21  --   --   ALKPHOS 65  --   --   BILITOT 0.4  --   --   PROT 6.6  --   --   ALBUMIN 3.0* 3.1* 3.2*   CBC:  Recent Labs Lab 05/24/16 0730 05/25/16 0251 05/27/16 0611 05/29/16 0815  WBC 5.2 5.9 4.6 5.4  HGB 10.2* 11.2* 9.7* 9.5*  HCT 31.8* 34.4* 29.9* 29.3*  MCV 93.3 94.5 92.9 92.7  PLT 158 185 157 168   CBG:  Recent Labs Lab 05/28/16 1605 05/28/16 2051 05/29/16 0613 05/29/16 1615 05/29/16 2050  GLUCAP 184* 306* 161* 197* 146*   Medications:  . aspirin  81 mg Oral Daily  . darbepoetin (ARANESP) injection - DIALYSIS  60 mcg Intravenous Q Wed-HD  . feeding supplement (NEPRO CARB STEADY)  237 mL Oral TID PC  . ferric citrate  210 mg Oral TID WC  . foscarnet (FOSCAVIR) IVPB  4,000 mg Intravenous Q M,W,F-1800  . ganciclovir (CYTOVENE) IV  1.25 mg/kg Intravenous Once per  day on Mon Wed Fri  . heparin  5,000 Units Subcutaneous Q8H  . hydrALAZINE  100 mg Oral Q8H  . insulin aspart  0-9 Units Subcutaneous TID WC  . insulin detemir  6 Units Subcutaneous QHS  . labetalol  300 mg Oral BID  . multivitamin  1 tablet Oral QHS  . polyethylene glycol  17 g Oral Daily    Dialysis Orders: MWF at [facility] 4h 2/2 bath P2 LUA AVF 76kg Hep 7100 - Venofer 50/wk - Mircera 50 q 2, last 2/7 - Hect IV q HD  Assessment/Plan: 1. Encephalitis: CSF with increased WBC count. ID following. 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 Started on Foscarnet and Ganciclovir. Also 1/ 6 BCx grew MRSA. On Vancomycin x 2 weeks. Mental status slowly improving. 2. ESRD: Continue HD on MWF schedule via AVF , next 3/2. 3. Anemia; Hgb 9.5, continue Aranesp q Wed. Will need inc dose as OP 4. Secondary hyperparathyroidism: Phos ok, on Auryxia. Hectoral on hold d/t high Corr Ca.- resume at lower dose  5.HTN/volume: BP variable, no edema. Last post-HD  weight ~70kg. Monitor. On hydralazine/labetalol. UF tomorrow 6. Nutrition:Alb 3.2. Continue protein supplements. 7. DM: On insulin, per primary. 8. Disp: plans to return home in the care of family- possibly soon .  Leveta Wahab A  05/30/2016, 10:09 AM

## 2016-05-30 NOTE — Progress Notes (Signed)
Subjective: [name] has no complaints this morning. He remains confused in conversation, stating that he has nowhere to live, that he is in [place], that his family are missing, etc. Hypertensive this morning as well.   Objective: Vital signs in last 24 hours: Vitals:   05/29/16 1334 05/29/16 1816 05/29/16 2128 05/30/16 0501  BP: (!) 145/67 (!) 154/86 (!) 151/73 (!) 168/74  Pulse: 68 64 72 79  Resp:  18 18 18   Temp: 97.4 F (36.3 C) 97.7 F (36.5 C) 98.4 F (36.9 C) 98.2 F (36.8 C)  TempSrc: Oral Oral Oral Oral  SpO2: 100% 100% 100% 100%  Weight:    160 lb 11.5 oz (72.9 kg)  Height:        Intake/Output Summary (Last 24 hours) at 05/30/16 0640 Last data filed at 05/30/16 0300  Gross per 24 hour  Intake                0 ml  Output              419 ml  Net             -419 ml    Physical Exam General appearance: Pleasant elderly gentleman, conversational but confused, clammy skin HENT: Normocephalic, atraumatic Cardiovascular: Regular rate and rhythm, systolic murmur present, no rubs or gallops Respiratory/Chest: Clear to ausculation bilaterally, normal work of breathing Abdomen: Soft, non-tender, non-distended Skin: Warm, dry, intact Neuro: Oriented to person but not location, time, or events. Unable to sum dime, nickel, quarter. Could perform one step of serial 7s from 100. Strength and sensation grossly intact Psych: Normal affect  Labs / Imaging / Procedures: CBC Latest Ref Rng & Units 05/29/2016 05/27/2016 05/25/2016  WBC 4.0 - 10.5 K/uL 5.4 4.6 5.9  Hemoglobin 13.0 - 17.0 g/dL 4.0(J9.5(L) 8.1(X9.7(L) 11.2(L)  Hematocrit 39.0 - 52.0 % 29.3(L) 29.9(L) 34.4(L)  Platelets 150 - 400 K/uL 168 157 185   BMP Latest Ref Rng & Units 05/29/2016 05/27/2016 05/25/2016  Glucose 65 - 99 mg/dL 914(N123(H) 829(F153(H) 621(H184(H)  BUN 6 - 20 mg/dL 08(M63(H) 57(Q80(H) 46(N35(H)  Creatinine 0.61 - 1.24 mg/dL 6.29(B6.86(H) 2.84(X8.29(H) 3.24(M4.98(H)  Sodium 135 - 145 mmol/L 138 135 133(L)  Potassium 3.5 - 5.1 mmol/L 4.0 4.7 5.0   Chloride 101 - 111 mmol/L 97(L) 95(L) 90(L)  CO2 22 - 32 mmol/L 30 28 31   Calcium 8.9 - 10.3 mg/dL 9.2 01.010.0 27.210.0   No results found.  Assessment/Plan:   Principal Problem:   Meningoencephalitis Active Problems:   Type 2 diabetes mellitus (HCC)   Hypertension, renal disease   ESRD on hemodialysis (HCC)   Labile blood glucose   Anemia of chronic disease   Hyperglycemia   Stroke (cerebrum) (HCC)   History of diabetes mellitus   Meningitis   Elevated serum creatinine   Encephalopathy   Hypothermia   Hypoglycemia   MRSA bacteremia  MichaelCatlin Anane-Brobbeyis a 71 y.o.malewith HTN, DM, ESRD on HD, who presents w/ 5 day h/o altered mental status likely due to to HHV6 meningoencephalitis.   Meningoencephalitis  2x CSF samples consistent with viral infection. Laboratory results showed HHV-6. Per infectious disease this would be rare in an immunocompetent host but has been reported prior in the literature. He was started on ganciclovir and foscarnet. His mental status has been improving however this preceded initiation of antiviral medications. We will continue to monitor over the next 24-48 hours. If the patient continues to improve clinically we will arrange for discharge in the coming  days. -- Continue ganciclovir and foscarnet for now -- If DC'd on this regimen will need PICC line -- Called family, they report he lives with daughter and wife and they are available to assist in his care at home  MRSA Bacteremia: 1 of 2 blood cultures drawn 05/16/16 positive. Blood cultures at presentation were negative. Repeat blood cultures have been obtained on 05/19/2016. No evidence of infective endocarditis on physical examination or neuro imaging thus far. Ultrasound of his fistula does not show fluid collection or evidence for infection.Will need a minimum of 2 weeks of vancomycin for MRSA bacteremia. Transthoracic echocardiogram shows left ventricular ejection fraction of 60-65% with moderate  aortic stenosis. Discontinue vancomycin tomorrow. -- Vancomycin with HD, completed 2 weeks on 2/28.  -- Blood cultures NGTD  IDDM - A1c 12.3 (05/08/16) - SSI-S - Levemir 6 units qhs  Hypertension, bp climbing to SBP 160-180s today Continue with antihypertensive medications as detailed below - HD per Nephrology - Hydralazine 100 mg po TID - Labetalol 300 mg twice a day - Hydralazine PRN BP > 185/110 - Resume home Imdur 24 hr 90 mg  ESRD on MWF HD: - Continue inpatient HD, recommendations per nephrology  Dispo: Anticipated discharge in approximately 1-2 day(s).   LOS: 22 days   Michael Forts, MD 05/30/2016, 6:40 AM Pager: [phone]

## 2016-05-30 NOTE — Progress Notes (Signed)
Occupational Therapy Treatment Patient Details Name: Michael Dorsey MRN: [id number] DOB: Nov 13, 1945 Today's Date: 05/30/2016    History of present illness 71 year old man with a history of end-stage renal disease requiring hemodialysis, diabetes, hypertension, and hyperlipidemia who presents with a five-day history of altered mental status. MRI on 2/9 showed no acute intracranial abnormality.    OT comments  Pt requires min A for ADLs due to impaired balance and requires min verbal cues for initiation and memory.  Will continue to follow.  He will require 24 hour supervision/assist at discharge.   Follow Up Recommendations  SNF;Supervision/Assistance - 24 hour    Equipment Recommendations  3 in 1 bedside commode    Recommendations for Other Services      Precautions / Restrictions Precautions Precautions: Fall       Mobility Bed Mobility Overal bed mobility: Needs Assistance Bed Mobility: Sit to Supine       Sit to supine: Supervision      Transfers Overall transfer level: Needs assistance Equipment used: Rolling walker (2 wheeled) Transfers: Sit to/from Stand Sit to Stand: Min guard Stand pivot transfers: Min guard            Balance Overall balance assessment: Needs assistance Sitting-balance support: No upper extremity supported;Feet supported Sitting balance-Leahy Scale: Good     Standing balance support: No upper extremity supported;Bilateral upper extremity supported;During functional activity Standing balance-Leahy Scale: Fair                     ADL Overall ADL's : Needs assistance/impaired     Grooming: Wash/dry hands;Wash/dry face;Oral care;Min guard;Standing Grooming Details (indicate cue type and reason): min cues to locate needed                 Toilet Transfer: Minimal assistance;Ambulation;Regular Teacher, adult education Details (indicate cue type and reason): min A for balance  Toileting- Clothing Manipulation and  Hygiene: Minimal assistance;Sit to/from stand       Functional mobility during ADLs: Minimal assistance General ADL Comments: Pt requires cues to locate items and for initiation.  Min A for balance       Vision                 Additional Comments: verbal cues to locate items needed for grooming    Perception     Praxis      Cognition   Behavior During Therapy: Flat affect Overall Cognitive Status: Impaired/Different from baseline Area of Impairment: Attention;Memory;Following commands;Problem solving Orientation Level: Disoriented to;Situation;Time Current Attention Level: Sustained;Selective Memory: Decreased short-term memory  Following Commands: Follows one step commands consistently;Follows one step commands with increased time Safety/Judgement: Decreased awareness of deficits   Problem Solving: Slow processing;Decreased initiation;Requires verbal cues General Comments: Pt with delayed reponse time.   Min cues for recall of info       Exercises     Shoulder Instructions       General Comments      Pertinent Vitals/ Pain       Pain Assessment: No/denies pain  Home Living                                          Prior Functioning/Environment              Frequency  Min 2X/week        Progress Toward Goals  OT Goals(current goals  can now be found in the care plan section)  Progress towards OT goals: Progressing toward goals     Plan Discharge plan remains appropriate    Co-evaluation                 End of Session Equipment Utilized During Treatment: Gait belt  OT Visit Diagnosis: Unsteadiness on feet (R26.81)   Activity Tolerance Patient tolerated treatment well   Patient Left in bed;with call bell/phone within reach;with bed alarm set   Nurse Communication Mobility status        Time: [id number] OT Time Calculation (min): 26 min  Charges: OT General Charges $OT Visit: 1 Procedure OT  Treatments $Self Care/Home Management : 23-37 mins  Reynolds AmericanWendi Vermelle Cammarata, OTR/L [phone]    Jeani HawkingConarpe, Tabari Volkert M 05/30/2016, 2:28 PM

## 2016-05-30 NOTE — Progress Notes (Signed)
Physical Therapy Treatment Patient Details Name: Michael Dorsey MRN: [id number] DOB: 05-04-1945 Today's Date: 05/30/2016    History of Present Illness 71 year old man with a history of end-stage renal disease requiring hemodialysis, diabetes, hypertension, and hyperlipidemia who presents with a five-day history of altered mental status. MRI on 2/9 showed no acute intracranial abnormality.     PT Comments    Patient progressing some with balance, but had significant LOB initially standing without UE support.  Continues to require assist due to cognitive deficits (for wayfinding, tactile cues for balance activities, and did not recall eating lunch.)  Will continue skilled PT in the acute setting with continue recommendation for SNF level rehab at d/c.  Follow Up Recommendations  Supervision/Assistance - 24 hour;SNF     Equipment Recommendations  Rolling walker with 5" wheels    Recommendations for Other Services       Precautions / Restrictions Precautions Precautions: Fall    Mobility  Bed Mobility Overal bed mobility: Needs Assistance Bed Mobility: Sit to Supine       Sit to supine: Supervision   General bed mobility comments: for safety  Transfers Overall transfer level: Needs assistance Equipment used: Rolling walker (2 wheeled) Transfers: Sit to/from Stand Sit to Stand: Min assist Stand pivot transfers: Min guard       General transfer comment: initially stood without device, pt with significant LOB min A for recovery and walker given for balance  Ambulation/Gait Ambulation/Gait assistance: Min guard Ambulation Distance (Feet): 150 Feet Assistive device: Rolling walker (2 wheeled) Gait Pattern/deviations: Step-through pattern;Decreased stride length     General Gait Details: excellent posture, slow gait with minguard for safety, cues for direction.   Stairs            Wheelchair Mobility    Modified Rankin (Stroke Patients Only)        Balance Overall balance assessment: Needs assistance Sitting-balance support: No upper extremity supported;Feet supported Sitting balance-Leahy Scale: Good     Standing balance support: No upper extremity supported;Bilateral upper extremity supported;During functional activity Standing balance-Leahy Scale: Fair Standing balance comment: standing balance activities on wall rail in hallway with min to mod A for balance, performance including side stepping and tandem gait with bilateral UE support                    Cognition Arousal/Alertness: Awake/alert Behavior During Therapy: Flat affect Overall Cognitive Status: Impaired/Different from baseline Area of Impairment: Attention;Memory;Following commands;Problem solving Orientation Level: Disoriented to;Situation;Time Current Attention Level: Sustained Memory: Decreased short-term memory Following Commands: Follows one step commands consistently;Follows one step commands with increased time Safety/Judgement: Decreased awareness of deficits   Problem Solving: Slow processing;Decreased initiation;Requires verbal cues General Comments: increased time and at times visual contact necessary for pt to respond to questions     Exercises General Exercises - Upper Extremity Shoulder Flexion: Strengthening;Both;Seated Shoulder ADduction: Strengthening;Both;10 reps;Seated General Exercises - Lower Extremity Hip Flexion/Marching: 10 reps;Strengthening;Both Toe Raises: Strengthening;10 reps;Seated;Both Heel Raises: Strengthening;10 reps;Seated;Both    General Comments        Pertinent Vitals/Pain Pain Assessment: No/denies pain    Home Living                      Prior Function            PT Goals (current goals can now be found in the care plan section) Progress towards PT goals: Progressing toward goals    Frequency    Min 3X/week  PT Plan Current plan remains appropriate    Co-evaluation              End of Session Equipment Utilized During Treatment: Gait belt Activity Tolerance: Patient tolerated treatment well Patient left: in bed;with call bell/phone within reach;with bed alarm set   PT Visit Diagnosis: Unsteadiness on feet (R26.81);Other abnormalities of gait and mobility (R26.89)     Time: 1534-1600 PT Time Calculation (min) (ACUTE ONLY): 26 min  Charges:  $Gait Training: 8-22 mins $Neuromuscular Re-education: 8-22 mins                    G Codes:       Elray McgregorCynthia Lysander Calixte 05/30/2016, 4:51 PM  Sheran Lawlessyndi Deonna Krummel, PT [phone] 05/30/2016

## 2016-05-30 NOTE — Progress Notes (Addendum)
  Speech Language Pathology Treatment: Cognitive-Linquistic  Patient Details Name: Michael Dorsey MRN: [id number] DOB: [birth year] Today's Date: 05/30/2016 Time: 1400-1420 SLP Time Calculation (min) (ACUTE ONLY): 20 min  Assessment / Plan / Recommendation Clinical Impression  Pt is oriented to person and location. He thought that it was February which is not far off, and used Min cues for basic verbal problem solving to reorient to the correct month/year. He does need Max cues for intellectual awareness of physical/cognitive deficits and orientation to current situation. Max cues including binary choices provided for structured memory tasks with ~75% accuracy. Pt will continue to benefit from SLP f/u both acutely and upon d/c to SNF.   HPI HPI: Pt is a 71 y.o. male with a PMH of end-stage renal disease requiring hemodialysis, diabetes, hypertension, and hyperlipidemia who presented with a five-day history of altered mental status. He was performing all of his activities of daily living at baseline. After his dialysis session on Friday he began refusing medications and then refused to attend dialysis on Monday. He has not been eating well. CT of head showed age indeterminate LEFT basal ganglia lacunar infarct, old appearing LEFT thalamus lacunar infarct. Moderate to severe global brain atrophy and moderate to severe chronic small vessel ischemic disease. CXR was unremarkable. 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       SLP Plan  Continue with current plan of care       Recommendations                   Follow up Recommendations: Skilled Nursing facility SLP Visit Diagnosis: Cognitive communication deficit (N82.956(R41.841) Plan: Continue with current plan of care       GO                [name], Colonel Krauser 05/30/2016, 2:45 PM  Maxcine HamLaura Paiewonsky, M.A. CCC-SLP [phone]

## 2016-05-30 NOTE — Progress Notes (Signed)
    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    Plan: 1.  No changes at this time.

## 2016-05-31 DIAGNOSIS — Z8619 Personal history of other infectious and parasitic diseases: Secondary | ICD-10-CM

## 2016-05-31 LAB — RENAL FUNCTION PANEL
Albumin: 3 g/dL — ABNORMAL LOW (ref 3.5–5.0)
Anion gap: 11 (ref 5–15)
BUN: 41 mg/dL — ABNORMAL HIGH (ref 6–20)
CHLORIDE: 99 mmol/L — AB (ref 101–111)
CO2: 25 mmol/L (ref 22–32)
CREATININE: 5.95 mg/dL — AB (ref 0.61–1.24)
Calcium: 8.7 mg/dL — ABNORMAL LOW (ref 8.9–10.3)
GFR calc non Af Amer: 8 mL/min — ABNORMAL LOW (ref >60)
GFR, EST AFRICAN AMERICAN: 10 mL/min — AB (ref >60)
GLUCOSE: 170 mg/dL — AB (ref 65–99)
Phosphorus: 3 mg/dL (ref 2.5–4.6)
Potassium: 3.7 mmol/L (ref 3.5–5.1)
Sodium: 135 mmol/L (ref 135–145)

## 2016-05-31 LAB — GLUCOSE, CAPILLARY
GLUCOSE-CAPILLARY: 193 mg/dL — AB (ref 65–99)
Glucose-Capillary: 190 mg/dL — ABNORMAL HIGH (ref 65–99)
Glucose-Capillary: 288 mg/dL — ABNORMAL HIGH (ref 65–99)

## 2016-05-31 LAB — CBC
HCT: 27.8 % — ABNORMAL LOW (ref 39.0–52.0)
Hemoglobin: 9.3 g/dL — ABNORMAL LOW (ref 13.0–17.0)
MCH: 30.8 pg (ref 26.0–34.0)
MCHC: 33.5 g/dL (ref 30.0–36.0)
MCV: 92.1 fL (ref 78.0–100.0)
Platelets: 179 10*3/uL (ref 150–400)
RBC: 3.02 MIL/uL — AB (ref 4.22–5.81)
RDW: 17.9 % — ABNORMAL HIGH (ref 11.5–15.5)
WBC: 5 10*3/uL (ref 4.0–10.5)

## 2016-05-31 LAB — MISC LABCORP TEST (SEND OUT): Labcorp test code: 824083

## 2016-05-31 MED ORDER — RENA-VITE PO TABS: 1.0000 | ORAL_TABLET | Freq: Every day | ORAL | 0 refills | Status: AC

## 2016-05-31 MED ORDER — INSULIN DETEMIR 100 UNIT/ML ~~LOC~~ SOLN: 6.0000 [IU] | Freq: Every day | SUBCUTANEOUS | 0 refills | Status: DC

## 2016-05-31 MED ORDER — DOXERCALCIFEROL 4 MCG/2ML IV SOLN
INTRAVENOUS | Status: AC
  Filled 2016-05-31: qty 2

## 2016-05-31 MED ORDER — DARBEPOETIN ALFA 100 MCG/0.5ML IJ SOSY: 100.0000 ug | PREFILLED_SYRINGE | INTRAMUSCULAR | Status: DC

## 2016-05-31 MED ORDER — LABETALOL HCL 300 MG PO TABS: 300.0000 mg | ORAL_TABLET | Freq: Two times a day (BID) | ORAL | 0 refills | Status: AC

## 2016-05-31 MED ORDER — HYDRALAZINE HCL 50 MG PO TABS
50.0000 mg | ORAL_TABLET | Freq: Three times a day (TID) | ORAL | Status: DC
  Administered 2016-05-31: 50 mg via ORAL
  Filled 2016-05-31 (×2): qty 1

## 2016-05-31 MED ORDER — HEPARIN SODIUM (PORCINE) 1000 UNIT/ML DIALYSIS: 20.0000 [IU]/kg | INTRAMUSCULAR | Status: DC | PRN

## 2016-05-31 NOTE — Progress Notes (Signed)
Transitions of Care Pharmacy Note  Plan:  Recommend monitoring of fluid status and BP with the discontinuation of furosemide and isosorbide.   Follow-up as an outpatient for education with family members present --------------------------------------------- Michael BookmanJohn Dorsey is an 71 y.o. male who presented with a chief complaint of AMS. In anticipation of discharge, pharmacy has reviewed this patient's prior to admission medication history, as well as current inpatient medications [facility].  I was unable to educate [name] on his medications due to his current mental capacity. No family members were present for education.   Assessment: Understanding of regimen: poor Understanding of indications: poor Potential of compliance: poor Barriers to Obtaining Medications: unable to determine  Patient instructed to contact inpatient pharmacy team with further questions or concerns if needed.    Time spent preparing for discharge counseling: 15 minutes Time spent counseling patient: 5 minutes   Thank you for allowing pharmacy to be a part of this patient's care.  Michael BossierApryl Brittiney Dorsey, PharmD PGY1 Pharmacy Resident [phone]) 05/31/2016 6:58 PM

## 2016-05-31 NOTE — Procedures (Signed)
Patient was seen on dialysis and the procedure was supervised.  BFR 400  Via AVF BP is  123/55.   Patient appears to be tolerating treatment well  Nikita Humble A 05/31/2016

## 2016-05-31 NOTE — Progress Notes (Signed)
Subjective:  Visited Michael Dorsey in dialysis this morning and he continues to be mildly confused. He reports feeling weak with generalized aching since arriving in dialysis this morning. Felt fine last night. Did not want to answer orientation or attention assessment questions today.  Objective: Vital signs in last 24 hours: Vitals:   05/30/16 1429 05/30/16 2100 05/31/16 0131 05/31/16 0504  BP: 131/69 (!) 153/67 (!) 144/69 140/65  Pulse: 70 72 77 79  Resp: 18  18 18   Temp: 97.7 F (36.5 C) 97.9 F (36.6 C) 98 F (36.7 C) 98.3 F (36.8 C)  TempSrc: Oral Oral Oral Oral  SpO2: 100% 100% 100% 100%  Weight:    160 lb 0.9 oz (72.6 kg)  Height:        Intake/Output Summary (Last 24 hours) at 05/31/16 0644 Last data filed at 05/31/16 0400  Gross per 24 hour  Intake              480 ml  Output               80 ml  Net              400 ml    Physical Exam General appearance: Pleasant elderly gentleman, conversational but confused, appears mildly uncomfortable HENT: Normocephalic, atraumatic Cardiovascular: Regular rate and rhythm, systolic murmur present, no rubs or gallops Respiratory/Chest: Clear to ausculation bilaterally, normal work of breathing Abdomen: Soft, non-tender, non-distended Skin: Warm, dry, intact Neuro: Alert and conversational Psych: Calm affect  Labs / Imaging / Procedures: CBC Latest Ref Rng & Units 05/29/2016 05/27/2016 05/25/2016  WBC 4.0 - 10.5 K/uL 5.4 4.6 5.9  Hemoglobin 13.0 - 17.0 g/dL 6.2(X) 5.2(W) 11.2(L)  Hematocrit 39.0 - 52.0 % 29.3(L) 29.9(L) 34.4(L)  Platelets 150 - 400 K/uL 168 157 185   BMP Latest Ref Rng & Units 05/29/2016 05/27/2016 05/25/2016  Glucose 65 - 99 mg/dL 413(K) 440(N) 027(O)  BUN 6 - 20 mg/dL 53(G) 64(Q) 03(K)  Creatinine 0.61 - 1.24 mg/dL 7.42(V) 9.56(L) 8.75(I)  Sodium 135 - 145 mmol/L 138 135 133(L)  Potassium 3.5 - 5.1 mmol/L 4.0 4.7 5.0  Chloride 101 - 111 mmol/L 97(L) 95(L) 90(L)  CO2 22 - 32 mmol/L 30 28 31     Calcium 8.9 - 10.3 mg/dL 9.2 43.3 29.5   No results found.  Assessment/Plan:   Principal Problem:   Meningoencephalitis Active Problems:   Type 2 diabetes mellitus (HCC)   Hypertension, renal disease   ESRD on hemodialysis (HCC)   Labile blood glucose   Anemia of chronic disease   Hyperglycemia   Stroke (cerebrum) (HCC)   History of diabetes mellitus   Meningitis   Elevated serum creatinine   Encephalopathy   Hypothermia   Hypoglycemia   MRSA bacteremia  MichaelMichael Anane-Brobbeyis a 71 y.o.malewith HTN, DM, ESRD on HD admitted for altered mental status, resolving, potentially due to HHV6 or other viral meningoencephalitis.   Meningoencephalitis  2x CSF samples consistent with viral infection, lymphocytic predominant WBC, elevated protein, low glucose. Laboratory results eventually showed HHV-6. Per infectious disease this would be rare in an immunocompetent host but has been reported prior in the literature. He was started on ganciclovir and foscarnet, but seem to be improving prior to initiation of these medicines. He received 5 days of ganciclovir and foscarnet with no significant improvement in his persistent confusion.  -- Discussed antiviral plan with infectious disease and we have decided to discontinue ganciclovir and foscarnet, which do not seem to have  improved his symptoms -- PT OT SLP evaluation strongly suggest patient needs SNF / 24 hour assist however he has no insurance, and does not meet criteria for letter of guarantee from the hospital - arrange for discharge home in the next few days with family assistance and home health PT/OT/RN  MRSA Bacteremia, resolved 1 of 2 blood cultures drawn 05/16/16 positive. Blood cultures at presentation were negative. Repeat blood cultures have been obtained on 05/19/2016. No evidence of infective endocarditis on physical examination or imaging thus far. Ultrasound of his fistula does not show fluid collection or evidence for  infection. Completed 2 weeks of vancomycin for MRSA bacteremia. Transthoracic echocardiogram shows left ventricular ejection fraction of 60-65% with moderate aortic stenosis, no vegetations. Discontinue vancomycin tomorrow. -- Vancomycin with HD, completed 2 weeks on 2/28 -- Blood cultures NGTD  IDDM - A1c 12.3 (05/08/16) - SSI-S - Levemir 6 units qhs  Hypertension, normotensive to hypotensive this morning at dialysis Continue with antihypertensive medications as detailed below - HD per Nephrology - Hydralazine 100 mg po TID - Labetalol 300 mg twice a day - Hydralazine PRN BP > 185/110 - Attempted restarting Imdur 24 hr 90 mg yesterday, normotensive today - discontinue  [facility]WF HD, [facility]: - Continue inpatient HD, recommendations per nephrology  Dispo: Anticipated discharge in approximately 1-2 day(s).   LOS: 23 days   Michael FortsAdam Altariq Goodall, MD 05/31/2016, 6:44 AM Pager: [phone]

## 2016-05-31 NOTE — Discharge Instructions (Signed)
[name] it was a pleasure taking care of you.   Please take your medications as instructed.  You have been setup for home health nursing, aid, and social work.   Please call and make an appointment with your primary care doctor for a visit in 1 week.   [facility] (06/03/2016).

## 2016-05-31 NOTE — Care Management Note (Addendum)
Case Management Note  Patient Details  Name: Michael Dorsey MRN: [id number] Date of Birth: 09/05/1945  Subjective/Objective:                    Action/Plan: [facility] Pt has no insurance. [facility] notified to see if patient qualifies for charity services.  Pt also with orders for walker and 3 in 1. [name] notified and will deliver the equipment to the room.  CM spoke with patients daughter Michael Dorsey earlier and informed her of potential d/c and of HH services. Michael Dorsey stated she would be able to pick her dad up when he is d/ced.  Michael Dorsey states [facility]. They are currently working on getting her father Medicaid.   Expected Discharge Date:                  Expected Discharge Plan:  Home w Home Health Services  In-House Referral:     Discharge planning Services  CM Consult  Post Acute Care Choice:  Durable Medical Equipment, Home Health Choice offered to:  Adult Children  DME Arranged:  3-N-1, Walker rolling DME Agency:  [facility]  HH Arranged:  RN, Nurse's Aide, Social Work [facility]  Status of Service:  Completed, signed off  If discussed at [employer] Meetings, dates discussed:    Additional Comments:  [name], RN 05/31/2016, 3:30 PM

## 2016-05-31 NOTE — Progress Notes (Signed)
    [facility]   Reason for visit: Follow up on encephalitis Day 16 antibiotics Day 5 foscarnet/acyclovir  Interval History: no fever, WBC wnl, dialysis today  Physical Exam: Constitutional:  Vitals:   05/31/16 1152 05/31/16 1322  BP: 129/69 128/64  Pulse: 74 76  Resp: 18 20  Temp: 97.7 F (36.5 C) 98.3 F (36.8 C)   patient appears in NAD Respiratory: Normal respiratory effort; CTA B Cardiovascular: RRR   Review of Systems: Gastrointestinal: negative for diarrhea Musculoskeletal: negative for myalgias and arthralgias  Lab Results  Component Value Date   WBC 5.0 05/31/2016   HGB 9.3 (L) 05/31/2016   HCT 27.8 (L) 05/31/2016   MCV 92.1 05/31/2016   PLT 179 05/31/2016    Lab Results  Component Value Date   CREATININE 5.95 (H) 05/31/2016   BUN 41 (H) 05/31/2016   NA 135 05/31/2016   K 3.7 05/31/2016   CL 99 (L) 05/31/2016   CO2 25 05/31/2016    Lab Results  Component Value Date   ALT 21 05/24/2016   AST 17 05/24/2016   ALKPHOS 65 05/24/2016     Microbiology: No results found for this or any previous visit (from the past 240 hour(s)).  Impression/Plan:  1. Viral encephalitis - previous CSF with 208 WBCs with lymphocytes.  Work up only revealed positive HHV6 and otherwise negative.  Continue waxing and waning mental status but overall improved.  In an immunocompenent person, HHV6 much less likely a pathogen and really liittle response to treatment and no known benefit to treatment in this type of patient.  Therefore I do not feel continued treatment with foscarnet and gancyclovir is indicated. Discussed with Dr. Laural BenesJohnson and the primary team Continue with supportive care  2. MRSA bacteremia - now completed 14 days of vancomycin.    I will sign off, please call with any questions

## 2016-05-31 NOTE — Progress Notes (Signed)
SLP Cancellation Note  Patient Details Name: Michael BookmanJohn Dorsey MRN: [id number] DOB: 1945/06/06   Cancelled treatment:       Reason Eval/Treat Not Completed: Patient at procedure or test/unavailable   [name], Other Atienza 05/31/2016, 11:29 AM  Maxcine HamLaura Paiewonsky, M.A. CCC-SLP [phone]

## 2016-05-31 NOTE — Progress Notes (Signed)
[facility] Progress Note   Subjective:   Seen on HD- disposition is an issue- needs SNF but not sure will qualify  Objective Vitals:   05/31/16 0829 05/31/16 0900 05/31/16 0915 05/31/16 0930  BP: (!) 166/71 (!) 103/50 (!) 130/56 (!) 123/55  Pulse: 70 63 74 76  Resp: 15     Temp:      TempSrc:      SpO2:      Weight:      Height:       Physical Exam General: Well appearing, NAD. Oriented x 2. Heart:RRR; no murmur. Lungs: CTA anteriorly Extremities: No LE edema Dialysis Access:  LUE AVF, + bruit.  Additional Objective Labs: Basic Metabolic Panel:  Recent Labs Lab 05/27/16 0611 05/29/16 0815 05/31/16 0800  NA 135 138 135  K 4.7 4.0 3.7  CL 95* 97* 99*  CO2 28 30 25   GLUCOSE 153* 123* 170*  BUN 80* 63* 41*  CREATININE 8.29* 6.86* 5.95*  CALCIUM 10.0 9.2 8.7*  PHOS 5.0* 3.2 3.0   Liver Function Tests:  Recent Labs Lab 05/27/16 0611 05/29/16 0815 05/31/16 0800  ALBUMIN 3.1* 3.2* 3.0*   CBC:  Recent Labs Lab 05/25/16 0251 05/27/16 0611 05/29/16 0815 05/31/16 0800  WBC 5.9 4.6 5.4 5.0  HGB 11.2* 9.7* 9.5* 9.3*  HCT 34.4* 29.9* 29.3* 27.8*  MCV 94.5 92.9 92.7 92.1  PLT 185 157 168 179   CBG:  Recent Labs Lab 05/30/16 1114 05/30/16 1653 05/30/16 1805 05/30/16 2121 05/31/16 0611  GLUCAP 252* 65 131* 127* 193*   Medications:  . aspirin  81 mg Oral Daily  . darbepoetin (ARANESP) injection - DIALYSIS  60 mcg Intravenous Q Wed-HD  . doxercalciferol  3 mcg Intravenous Q M,W,F-HD  . feeding supplement (NEPRO CARB STEADY)  237 mL Oral TID PC  . ferric citrate  210 mg Oral TID WC  . foscarnet (FOSCAVIR) IVPB  4,000 mg Intravenous Q M,W,F-1800  . ganciclovir (CYTOVENE) IV  1.25 mg/kg Intravenous Once per day on Mon Wed Fri  . heparin  5,000 Units Subcutaneous Q8H  . hydrALAZINE  100 mg Oral Q8H  . insulin aspart  0-9 Units Subcutaneous TID WC  . insulin detemir  6 Units Subcutaneous QHS  . isosorbide mononitrate  90 mg Oral Daily   . labetalol  300 mg Oral BID  . multivitamin  1 tablet Oral QHS  . polyethylene glycol  17 g Oral Daily    Dialysis Orders: MWF at [facility] 4h 2/2 bath P2 LUA AVF 76kg Hep 7100 - Venofer 50/wk - Mircera 50 q 2, last 2/7 - Hect 6mcg IV q HD  Assessment/Plan: 1. Encephalitis: CSF with increased WBC count. ID following. 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 Started on Foscarnet and Ganciclovir. Also 1/ 6 BCx grew MRSA. On Vancomycin x 2 weeks. Mental status slowly improving. 2. ESRD: Continue HD on MWF schedule via AVF , done today 3. Anemia; Hgb 9.3, continue Aranesp 60mcg q Wed. Will  inc dose  4. Secondary hyperparathyroidism: Phos ok, on Auryxia. Hectoral on hold d/t high Corr Ca.- resume at lower dose  5.HTN/volume: BP variable, no edema. Last post-HD weight ~70kg. Monitor. On hydralazine/labetalol- will dec hydralazine due to some low BPs on HD.  UF today moderate 6. Nutrition:Alb 3.2. Continue protein supplements. 7. DM: On insulin, per primary. 8. Disp: needing 24 hour supervision- some financial issues for SNF- per primary team   Jonika Critz A  05/31/2016, 9:45 AM

## 2016-05-31 NOTE — Clinical Social Work Note (Addendum)
MD called CSW asking about SNF. Discussed that patient does not qualify for LOG. MD asked how patient is getting HD without insurance. CSW left voicemail for financial counselor to see if patient has been screened for Medicaid application. Awaiting call back.  Charlynn CourtSarah Manan Olmo, CSW [phone]  10:46 am Received voicemail from Artistfinancial counselor. The patient does have an emergency Medicaid application pending.  CSW signing off. Consult again if any other social work needs arise.  Charlynn CourtSarah Hubert Derstine, CSW [phone]

## 2016-06-03 ENCOUNTER — Inpatient Hospital Stay: Payer: Self-pay | Admitting: Family Medicine

## 2016-06-03 MED FILL — RENA-VITE TABLET: 30 days supply | Qty: 30 | Fill #0

## 2016-06-03 MED FILL — LABETALOL HCL 300 MG TABLET: 300 | 30 days supply | Qty: 60 | Fill #0

## 2016-07-01 ENCOUNTER — Telehealth: Payer: Self-pay

## 2016-07-01 NOTE — Telephone Encounter (Signed)
Leda Gauze from [employer] called and wanted Dr. Venetia Night to sign patient's Death Certificate.  She states that the patient died on 07-13-2016 in his home.  He was transported to [facility] by [facility], where he was kept until the March 30th.  He was then transported to [facility].  Michael Dorsey states that none of the proper paperwork was completed.  She states the medical examiner states that it clearly was not a case that he got involved in and that EMS pronounced him and on the form they received stated Cause of Death was a cardiac arrest. Michael Dorsey feels that the ball was dropped and there is nothing legal about this case.  Patient is supposed to be transported to the [place]  in [place] then to [place], which is his home.  The mortuary is unable to do anything without a physician signing his Death Certificate. Writer informed her that patient had not seen Dr. Venetia Night in almost a year although she was aware of his medical status from recent phone calls regarding his in patient status, she had not seen him.  Our demographic information was not even correct for he had moved to [facility]. At this time Michael Dorsey agrees that we do not sign the form and that she will look into it further.

## 2016-07-02 LAB — ACID FAST CULTURE WITH REFLEXED SENSITIVITIES (MYCOBACTERIA): Acid Fast Culture: NEGATIVE

## 2016-07-04 NOTE — Progress Notes (Signed)
Dr. Hyman Hopes signed the death certificate yesterday.  [place] made a copy of the certficate to be scanned in to patient's chart.

## 2016-07-08 LAB — ACID FAST CULTURE WITH REFLEXED SENSITIVITIES (MYCOBACTERIA)

## 2016-07-08 LAB — ACID FAST CULTURE WITH REFLEXED SENSITIVITIES: ACID FAST CULTURE - AFSCU3: NEGATIVE

## 2018-02-08 IMAGING — MR MR HEAD W/O CM
10 of 12 series · 33 of 48 positions shown · non-contrast
Comparison: CT of the head 05/08/2016.

CLINICAL DATA: Altered mental status.  Abnormal CT scan.

EXAM:
MRI HEAD WITHOUT CONTRAST
TECHNIQUE: Multiplanar, multiecho pulse sequences of the brain and surrounding
structures were obtained without intravenous contrast.

[Series 3: T1 · sagittal · 5.0mm · 0.47mm/px · 2 of 23 slices shown]
[im 1/23]
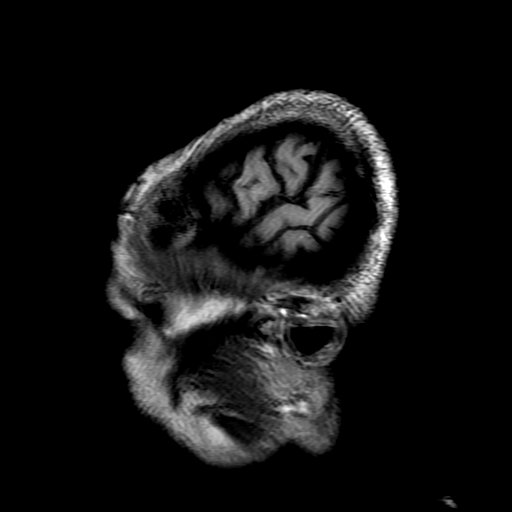
[im 23/23]
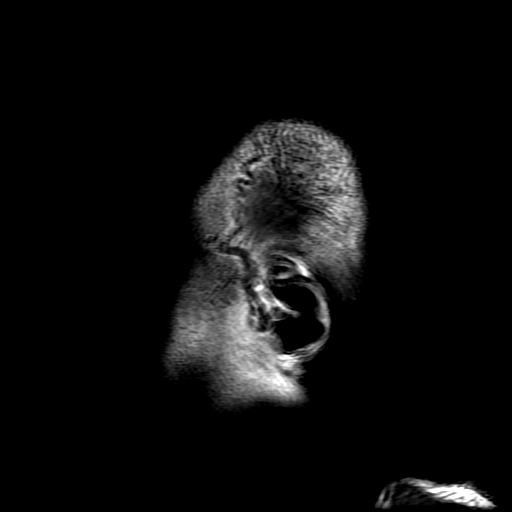

[Series 4: T2 · axial · 5.0mm · 0.47mm/px · 1 of 14 slices shown (1 of 3)]
[im 1/14]
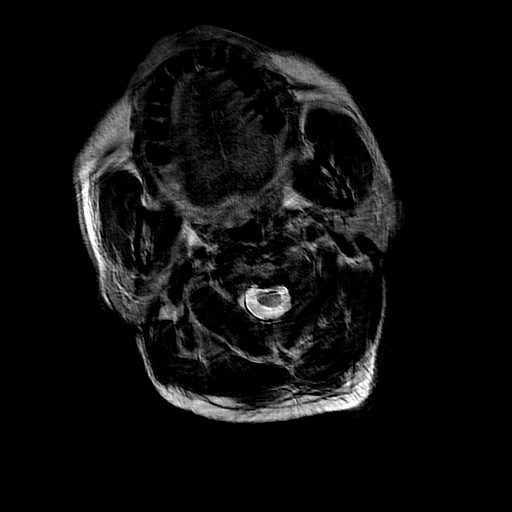

[Series 6: DWI · axial · 3.0mm · 1.09mm/px · z∈[-28,+112]mm · 9 of 98 slices shown (1 of 4)]
[im 1/98]
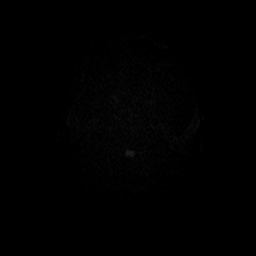
[im 13/98]
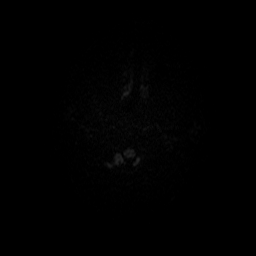
[im 25/98]
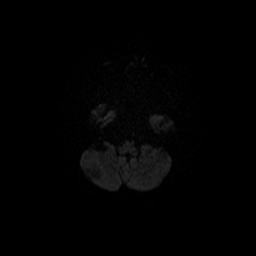
[im 37/98]
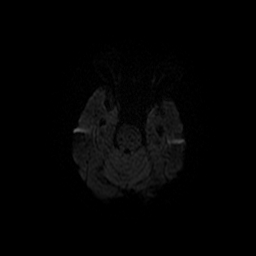
[im 49/98]
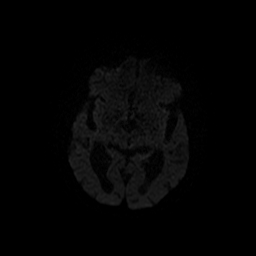
[im 61/98]
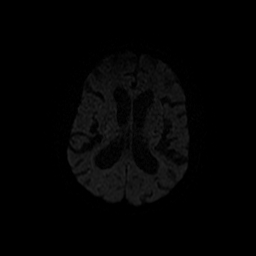
[im 73/98]
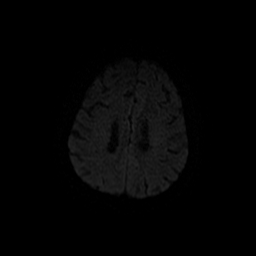
[im 85/98]
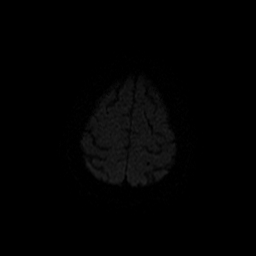
[im 98/98]
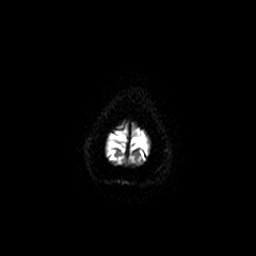

[Series 7: T2 · axial · 5.0mm · 0.47mm/px · z∈[-23,+130]mm · 2 of 27 slices shown (2 of 3)]
[im 1/27]
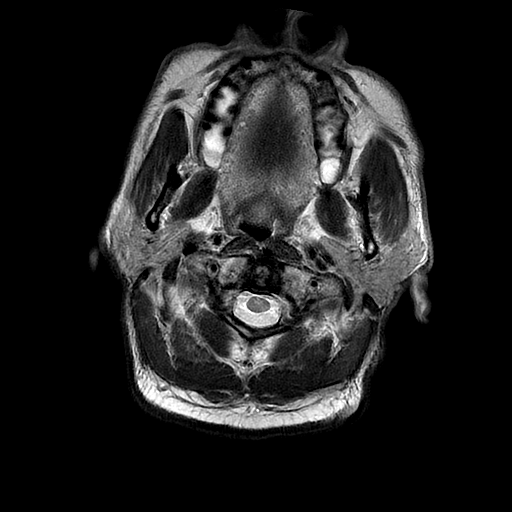
[im 27/27]
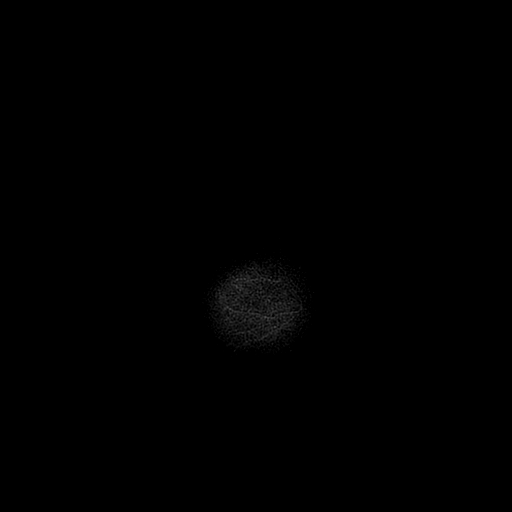

[Series 8: FLAIR · axial · 5.0mm · 0.43mm/px · z∈[-21,+132]mm · 2 of 27 slices shown]
[im 1/27]
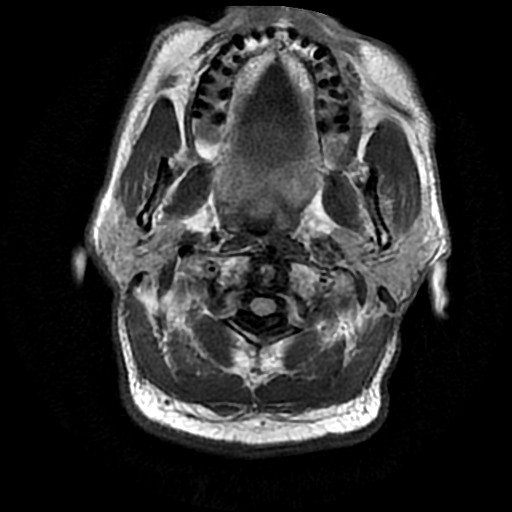
[im 27/27]
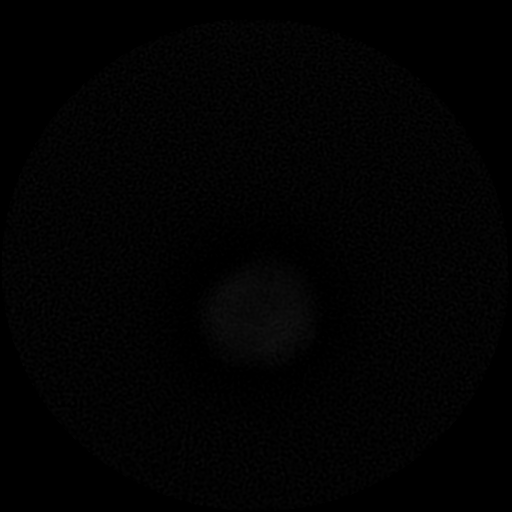

[Series 9: T2 · coronal · 5.0mm · 0.43mm/px · 2 of 28 slices shown (3 of 3)]
[im 1/28]
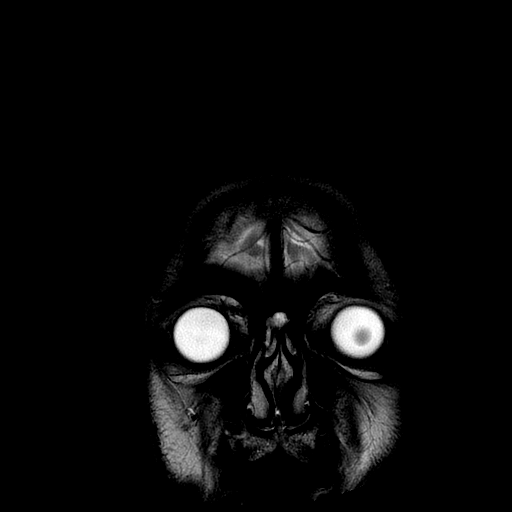
[im 28/28]
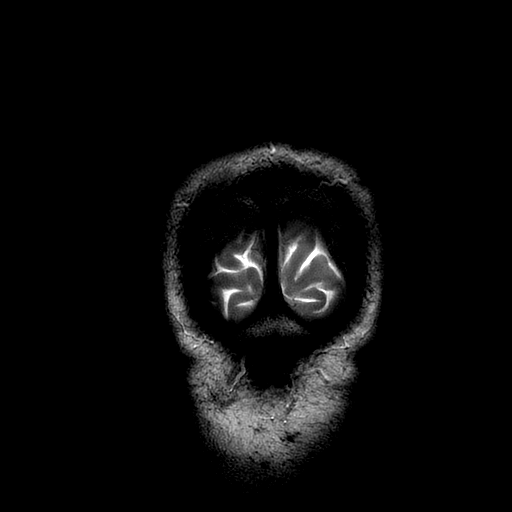

[Series 10: DWI · coronal · 5.0mm · 1.09mm/px · 6 of 64 slices shown (2 of 4)]
[im 1/64]
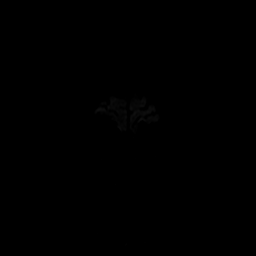
[im 13/64]
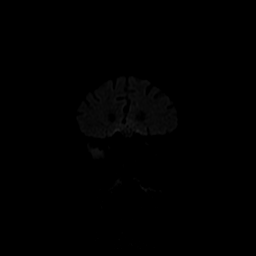
[im 26/64]
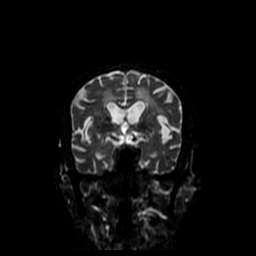
[im 38/64]
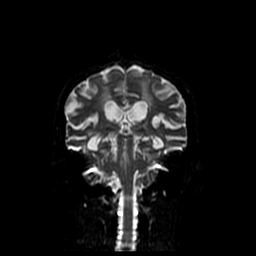
[im 51/64]
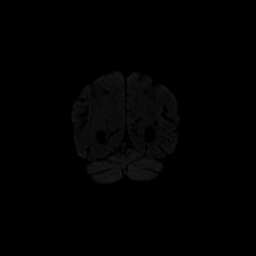
[im 64/64]
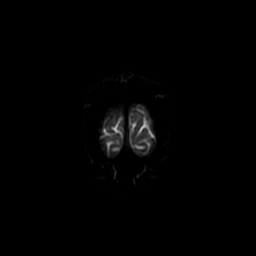

[Series 11: ax mpgr · axial · 5.0mm · 0.43mm/px · z∈[-27,+124]mm · 2 of 23 slices shown]
[im 1/23]
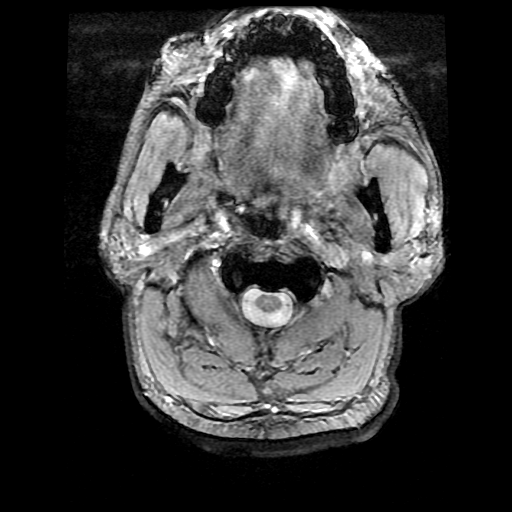
[im 23/23]
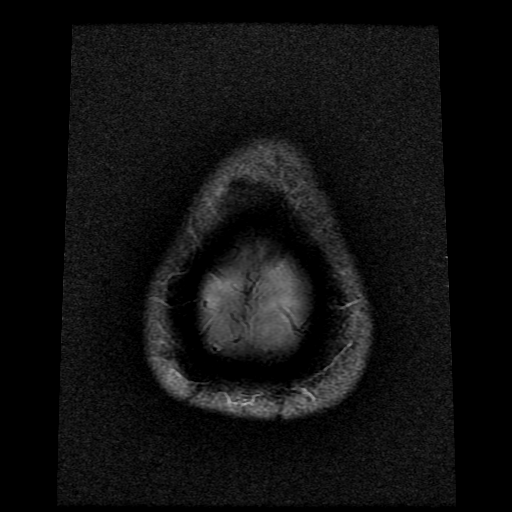

[Series 600: DWI · axial · 3.0mm · 1.09mm/px · z∈[-28,+112]mm · 4 of 49 slices shown (3 of 4)]
[im 1/49]
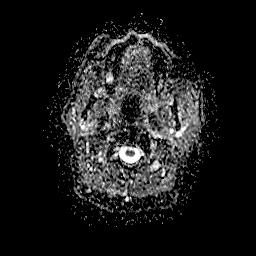
[im 17/49]
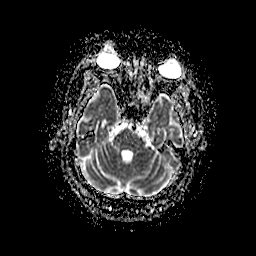
[im 33/49]
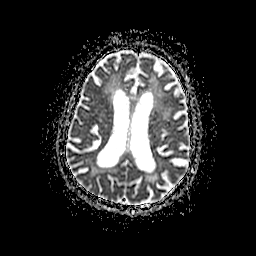
[im 49/49]
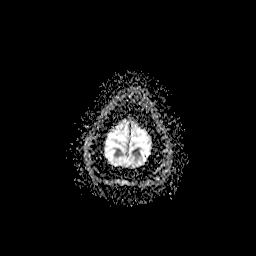

[Series 1000: DWI · coronal · 5.0mm · 1.09mm/px · 3 of 32 slices shown (4 of 4)]
[im 1/32]
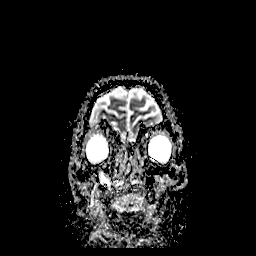
[im 16/32]
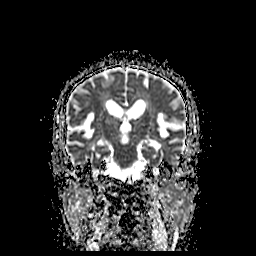
[im 32/32]
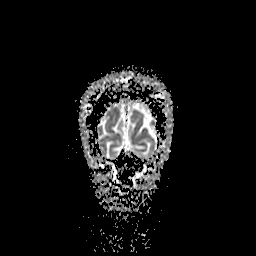

[33 of 48 positions shown; findings below may reference images not displayed]

FINDINGS: Brain: The diffusion-weighted images demonstrate no acute or
subacute infarction. Stents white matter changes correlate with the
CT scan. Asymmetric ischemic changes are present within the left
basal ganglia. Dilated perivascular spaces are present throughout
the basal ganglia and thalami. The internal auditory canals are
normal bilaterally. The brainstem and cerebellum are normal.

Vascular: Flow is present in the major intracranial arteries.

Skull and upper cervical spine: The skullbase is normal. The
craniocervical junction is within normal limits. Marrow signal is
normal. Midline sagittal structures are normal.

Sinuses/Orbits: Mucosal thickening is present along the floor of the
maxillary sinuses bilaterally. The remaining paranasal sinuses and
the mastoid air cells are clear.
IMPRESSION: 1. No acute intracranial abnormality.
2. Severe age advanced atrophy and diffuse white matter disease
reflecting the sequela of chronic microvascular ischemia.
3. Asymmetric ischemic changes in the left basal ganglia compared to
the right. This accounts for the CT findings.
4. Maxillary sinus disease as described.

## 2022-11-28 ENCOUNTER — Telehealth: Payer: Self-pay | Admitting: Family Medicine

## 2022-11-28 NOTE — Telephone Encounter (Signed)
Caller name: Errol Kresge  On DPR?: Yes  Call back number: [phone] (home)  Provider they see: Clinton Sawyer   Reason for call:  Pt is going to college and needs copy of immunizations. Pt will bring form to fill out  Tdap ???

## 2022-11-28 NOTE — Telephone Encounter (Signed)
This note is under wrong pt

## 2022-12-05 NOTE — Telephone Encounter (Signed)
Pt forgot them that day brought them today.Marland KitchenMarland Kitchen
